# Patient Record
Sex: Male | Born: 1945
Health system: Southern US, Community
[De-identification: ages and names within clinical notes are randomized; demographics above are authoritative.]

## PROBLEM LIST (undated history)

## (undated) DIAGNOSIS — I10 Essential (primary) hypertension: Secondary | ICD-10-CM

## (undated) DIAGNOSIS — C189 Malignant neoplasm of colon, unspecified: Secondary | ICD-10-CM

## (undated) DIAGNOSIS — D126 Benign neoplasm of colon, unspecified: Secondary | ICD-10-CM

## (undated) DIAGNOSIS — I214 Non-ST elevation (NSTEMI) myocardial infarction: Secondary | ICD-10-CM

## (undated) DIAGNOSIS — E785 Hyperlipidemia, unspecified: Secondary | ICD-10-CM

## (undated) DIAGNOSIS — N189 Chronic kidney disease, unspecified: Secondary | ICD-10-CM

## (undated) DIAGNOSIS — N529 Male erectile dysfunction, unspecified: Secondary | ICD-10-CM

## (undated) DIAGNOSIS — R7303 Prediabetes: Secondary | ICD-10-CM

## (undated) DIAGNOSIS — M1990 Unspecified osteoarthritis, unspecified site: Secondary | ICD-10-CM

## (undated) DIAGNOSIS — K4090 Unilateral inguinal hernia, without obstruction or gangrene, not specified as recurrent: Secondary | ICD-10-CM

## (undated) HISTORY — DX: Benign neoplasm of colon, unspecified: D12.6

## (undated) HISTORY — PX: EYE SURGERY: SHX253

## (undated) HISTORY — PX: OTHER SURGICAL HISTORY: SHX169

## (undated) HISTORY — DX: Non-ST elevation (NSTEMI) myocardial infarction: I21.4

---

## 2005-04-19 ENCOUNTER — Ambulatory Visit: Payer: Self-pay

## 2005-04-30 HISTORY — PX: COLON SURGERY: SHX602

## 2005-05-22 ENCOUNTER — Inpatient Hospital Stay: Payer: Self-pay | Admitting: Surgery

## 2005-06-08 ENCOUNTER — Ambulatory Visit: Payer: Self-pay | Admitting: Oncology

## 2005-06-30 ENCOUNTER — Ambulatory Visit: Payer: Self-pay | Admitting: Oncology

## 2006-01-07 ENCOUNTER — Ambulatory Visit: Payer: Self-pay | Admitting: Oncology

## 2006-07-05 ENCOUNTER — Ambulatory Visit: Payer: Self-pay | Admitting: Oncology

## 2007-01-10 ENCOUNTER — Ambulatory Visit: Payer: Self-pay | Admitting: Oncology

## 2007-07-01 ENCOUNTER — Ambulatory Visit: Payer: Self-pay | Admitting: Oncology

## 2007-07-09 ENCOUNTER — Ambulatory Visit: Payer: Self-pay | Admitting: Oncology

## 2007-08-01 ENCOUNTER — Ambulatory Visit: Payer: Self-pay | Admitting: Oncology

## 2007-08-19 ENCOUNTER — Ambulatory Visit: Payer: Self-pay | Admitting: Surgery

## 2008-01-01 ENCOUNTER — Ambulatory Visit: Payer: Self-pay | Admitting: Oncology

## 2008-01-07 ENCOUNTER — Ambulatory Visit: Payer: Self-pay | Admitting: Oncology

## 2008-02-01 ENCOUNTER — Ambulatory Visit: Payer: Self-pay | Admitting: Oncology

## 2008-05-31 ENCOUNTER — Ambulatory Visit: Payer: Self-pay | Admitting: Oncology

## 2008-06-30 ENCOUNTER — Ambulatory Visit: Payer: Self-pay | Admitting: Oncology

## 2008-07-15 ENCOUNTER — Ambulatory Visit: Payer: Self-pay | Admitting: Oncology

## 2008-07-31 ENCOUNTER — Ambulatory Visit: Payer: Self-pay | Admitting: Oncology

## 2008-12-31 ENCOUNTER — Ambulatory Visit: Payer: Self-pay | Admitting: Oncology

## 2009-01-13 ENCOUNTER — Ambulatory Visit: Payer: Self-pay | Admitting: Oncology

## 2009-01-31 ENCOUNTER — Ambulatory Visit: Payer: Self-pay | Admitting: Oncology

## 2009-06-30 ENCOUNTER — Ambulatory Visit: Payer: Self-pay | Admitting: Oncology

## 2009-07-13 ENCOUNTER — Ambulatory Visit: Payer: Self-pay | Admitting: Oncology

## 2009-07-31 ENCOUNTER — Ambulatory Visit: Payer: Self-pay | Admitting: Oncology

## 2009-12-31 ENCOUNTER — Ambulatory Visit: Payer: Self-pay | Admitting: Oncology

## 2010-01-25 ENCOUNTER — Ambulatory Visit: Payer: Self-pay | Admitting: Oncology

## 2010-01-31 ENCOUNTER — Ambulatory Visit: Payer: Self-pay | Admitting: Oncology

## 2010-07-31 ENCOUNTER — Ambulatory Visit: Payer: Self-pay | Admitting: Oncology

## 2010-08-03 ENCOUNTER — Ambulatory Visit: Payer: Self-pay | Admitting: Oncology

## 2010-08-31 ENCOUNTER — Ambulatory Visit: Payer: Self-pay | Admitting: Oncology

## 2011-09-14 ENCOUNTER — Inpatient Hospital Stay: Payer: Self-pay | Admitting: Internal Medicine

## 2011-09-14 DIAGNOSIS — Z01810 Encounter for preprocedural cardiovascular examination: Secondary | ICD-10-CM

## 2015-01-10 DIAGNOSIS — R7989 Other specified abnormal findings of blood chemistry: Secondary | ICD-10-CM | POA: Insufficient documentation

## 2016-03-12 ENCOUNTER — Encounter: Payer: Self-pay | Admitting: *Deleted

## 2016-03-13 ENCOUNTER — Encounter: Payer: Self-pay | Admitting: *Deleted

## 2016-03-13 ENCOUNTER — Encounter
Admission: RE | Disposition: A | Discharge status: Home / Self Care | Payer: Self-pay | Source: Ambulatory Visit | Attending: Gastroenterology

## 2016-03-13 ENCOUNTER — Ambulatory Visit: Payer: Medicare Other | Admitting: Anesthesiology

## 2016-03-13 ENCOUNTER — Ambulatory Visit
Admission: RE | Admit: 2016-03-13 | Discharge: 2016-03-13 | Disposition: A | Discharge status: Home / Self Care | Payer: Medicare Other | Source: Ambulatory Visit | Attending: Gastroenterology | Admitting: Gastroenterology

## 2016-03-13 DIAGNOSIS — D125 Benign neoplasm of sigmoid colon: Secondary | ICD-10-CM | POA: Diagnosis not present

## 2016-03-13 DIAGNOSIS — Z87891 Personal history of nicotine dependence: Secondary | ICD-10-CM | POA: Diagnosis not present

## 2016-03-13 DIAGNOSIS — N529 Male erectile dysfunction, unspecified: Secondary | ICD-10-CM | POA: Diagnosis not present

## 2016-03-13 DIAGNOSIS — Z85038 Personal history of other malignant neoplasm of large intestine: Secondary | ICD-10-CM | POA: Diagnosis not present

## 2016-03-13 DIAGNOSIS — Z980 Intestinal bypass and anastomosis status: Secondary | ICD-10-CM | POA: Insufficient documentation

## 2016-03-13 DIAGNOSIS — K635 Polyp of colon: Secondary | ICD-10-CM | POA: Insufficient documentation

## 2016-03-13 DIAGNOSIS — Z79899 Other long term (current) drug therapy: Secondary | ICD-10-CM | POA: Insufficient documentation

## 2016-03-13 DIAGNOSIS — Z1211 Encounter for screening for malignant neoplasm of colon: Secondary | ICD-10-CM | POA: Diagnosis not present

## 2016-03-13 DIAGNOSIS — E785 Hyperlipidemia, unspecified: Secondary | ICD-10-CM | POA: Diagnosis not present

## 2016-03-13 DIAGNOSIS — Z7982 Long term (current) use of aspirin: Secondary | ICD-10-CM | POA: Diagnosis not present

## 2016-03-13 DIAGNOSIS — K621 Rectal polyp: Secondary | ICD-10-CM | POA: Insufficient documentation

## 2016-03-13 DIAGNOSIS — K5730 Diverticulosis of large intestine without perforation or abscess without bleeding: Secondary | ICD-10-CM | POA: Insufficient documentation

## 2016-03-13 DIAGNOSIS — I10 Essential (primary) hypertension: Secondary | ICD-10-CM | POA: Diagnosis not present

## 2016-03-13 DIAGNOSIS — K640 First degree hemorrhoids: Secondary | ICD-10-CM | POA: Diagnosis not present

## 2016-03-13 HISTORY — DX: Male erectile dysfunction, unspecified: N52.9

## 2016-03-13 HISTORY — DX: Hyperlipidemia, unspecified: E78.5

## 2016-03-13 HISTORY — DX: Malignant neoplasm of colon, unspecified: C18.9

## 2016-03-13 HISTORY — DX: Essential (primary) hypertension: I10

## 2016-03-13 HISTORY — PX: COLONOSCOPY WITH PROPOFOL: SHX5780

## 2016-03-13 SURGERY — COLONOSCOPY WITH PROPOFOL
Anesthesia: General

## 2016-03-13 MED ORDER — PROPOFOL 10 MG/ML IV BOLUS
INTRAVENOUS | Status: DC | PRN
  Administered 2016-03-13: 70 mg via INTRAVENOUS
  Administered 2016-03-13: 13 mg via INTRAVENOUS

## 2016-03-13 MED ORDER — SODIUM CHLORIDE 0.9 % IV SOLN
INTRAVENOUS | Status: DC
  Administered 2016-03-13 (×2): via INTRAVENOUS

## 2016-03-13 MED ORDER — SODIUM CHLORIDE 0.9 % IV SOLN: INTRAVENOUS | Status: DC

## 2016-03-13 MED ORDER — PROPOFOL 500 MG/50ML IV EMUL
INTRAVENOUS | Status: DC | PRN
  Administered 2016-03-13: 100 ug/kg/min via INTRAVENOUS

## 2016-03-13 MED ORDER — FENTANYL CITRATE (PF) 100 MCG/2ML IJ SOLN
INTRAMUSCULAR | Status: DC | PRN
  Administered 2016-03-13: 50 ug via INTRAVENOUS

## 2016-03-13 MED ORDER — LIDOCAINE HCL (CARDIAC) 20 MG/ML IV SOLN
INTRAVENOUS | Status: DC | PRN
  Administered 2016-03-13: 100 mg via INTRAVENOUS

## 2016-03-13 MED ORDER — MIDAZOLAM HCL 5 MG/5ML IJ SOLN
INTRAMUSCULAR | Status: DC | PRN
  Administered 2016-03-13: 1 mg via INTRAVENOUS

## 2016-03-13 NOTE — Anesthesia Preprocedure Evaluation (Signed)
Anesthesia Evaluation  Patient identified by MRN, date of birth, ID band Patient awake    Reviewed: Allergy & Precautions, H&P , NPO status , Patient's Chart, lab work & pertinent test results, reviewed documented beta blocker date and time   Airway Mallampati: II   Neck ROM: full    Dental  (+) Upper Dentures, Lower Dentures   Pulmonary neg pulmonary ROS, former smoker,    Pulmonary exam normal        Cardiovascular hypertension, negative cardio ROS Normal cardiovascular exam Rate:Normal     Neuro/Psych negative neurological ROS  negative psych ROS   GI/Hepatic negative GI ROS, Neg liver ROS,   Endo/Other  negative endocrine ROS  Renal/GU negative Renal ROS  negative genitourinary   Musculoskeletal   Abdominal   Peds  Hematology negative hematology ROS (+)   Anesthesia Other Findings Past Medical History:   Colon cancer [facility])                                           ED (erectile dysfunction)                                    Hypertension                                                 Hyperlipidemia                                             Past Surgical History:   COLON SURGERY                                   Right 05/06          Comment:colectomy and sigmoid colectomy    right foot surgery                                          BMI    Body Mass Index   19.53 kg/m 2     Reproductive/Obstetrics                             Anesthesia Physical Anesthesia Plan  ASA: III  Anesthesia Plan: General   Post-op Pain Management:    Induction:   Airway Management Planned:   Additional Equipment:   Intra-op Plan:   Post-operative Plan:   Informed Consent: I have reviewed the patients History and Physical, chart, labs and discussed the procedure including the risks, benefits and alternatives for the proposed anesthesia with the patient or authorized representative who has  indicated his/her understanding and acceptance.   Dental Advisory Given  Plan Discussed with: CRNA  Anesthesia Plan Comments:         Anesthesia Quick Evaluation

## 2016-03-13 NOTE — Anesthesia Postprocedure Evaluation (Deleted)
Anesthesia Post Note  Patient: Carl Townsend  Procedure(s) Performed: Procedure(s) (LRB): COLONOSCOPY WITH PROPOFOL (N/A)  Patient location during evaluation: PACU Anesthesia Type: General Level of consciousness: awake and alert Pain management: pain level controlled Vital Signs Assessment: post-procedure vital signs reviewed and stable Respiratory status: spontaneous breathing, nonlabored ventilation, respiratory function stable and patient connected to nasal cannula oxygen Cardiovascular status: blood pressure returned to baseline and stable Postop Assessment: no signs of nausea or vomiting Anesthetic complications: no    Last Vitals:  Filed Vitals:   03/13/16 0648  BP: 169/94  Pulse: 68  Temp: 36 C  Resp: 18    Last Pain: There were no vitals filed for this visit.               [name]

## 2016-03-13 NOTE — Transfer of Care (Signed)
Immediate Anesthesia Transfer of Care Note  Patient: Carl Townsend  Procedure(s) Performed: Procedure(s): COLONOSCOPY WITH PROPOFOL (N/A)  Patient Location: PACU  Anesthesia Type:General  Level of Consciousness: awake, alert , oriented and patient cooperative  Airway & Oxygen Therapy: Patient Spontanous Breathing and Patient connected to nasal cannula oxygen  Post-op Assessment: Report given to RN and Post -op Vital signs reviewed and stable  Post vital signs: Reviewed and stable  Last Vitals:  Filed Vitals:   03/13/16 0900 03/13/16 0901  BP:  94/64  Pulse:  72  Temp: 36 C 36 C  Resp:  17    Complications: No apparent anesthesia complications

## 2016-03-13 NOTE — H&P (Signed)
Outpatient short stay form Pre-procedure 03/13/2016 8:15 AM Lollie Sails MD  Primary Physician: Dr Ginette Pitman  Reason for visit:  Colonoscopy  History of present illness:  Patient is a 70 year old male presenting today for colonoscopy. He has a personal history of colon cancer that underwent resection in 2006. His last colonoscopy was in 2012. He showed no repeat polyps at that time. He denies use of any aspirin products with the exception of 81 mg aspirin that he is held for several days. He does not use any blood thinning agents. He tolerated his prep well.    Current facility-administered medications:  .  0.9 %  sodium chloride infusion, , Intravenous, Continuous, Lollie Sails, MD, Last Rate: 20 mL/hr at 03/13/16 0706 .  0.9 %  sodium chloride infusion, , Intravenous, Continuous, Lollie Sails, MD  Prescriptions prior to admission  Medication Sig Dispense Refill Last Dose  . aspirin 81 MG tablet Take 81 mg by mouth daily.   Past Week at Unknown time  . atenolol (TENORMIN) 50 MG tablet Take 50 mg by mouth daily.     . hydrochlorothiazide (HYDRODIURIL) 25 MG tablet Take 25 mg by mouth daily.        No Known Allergies   Past Medical History  Diagnosis Date  . Colon cancer ([place])   . ED (erectile dysfunction)   . Hypertension   . Hyperlipidemia     Review of systems:      Physical Exam    Heart and lungs: Regular rate and rhythm without rub or gallop, lungs are bilaterally clear.    HEENT: Normocephalic atraumatic eyes are anicteric    Other:     Pertinant exam for procedure: Soft nontender nondistended bowel sounds positive normoactive.    Planned proceedures: Colonoscopy and indicated procedures. I have discussed the risks benefits and complications of procedures to include not limited to bleeding, infection, perforation and the risk of sedation and the patient wishes to proceed.    Lollie Sails, MD Gastroenterology 03/13/2016  8:15 AM

## 2016-03-13 NOTE — Op Note (Signed)
[facility] Gastroenterology Patient Name: Carl Townsend Procedure Date: 03/13/2016 8:13 AM MRN: [id number] Account #: [id number] Date of Birth: 1946-07-12 Admit Type: Outpatient Age: 70 Room: [facility] ENDO ROOM 3 Gender: Male Note Status: Finalized Procedure:            Colonoscopy Indications:          Family history of colonic polyps in a first-degree                        relative, Personal history of malignant neoplasm of the                        colon Providers:            Lollie Sails, MD Referring MD:         [name], MD (Referring MD) Medicines:            Monitored Anesthesia Care Complications:        No immediate complications. Procedure:            Pre-Anesthesia Assessment:                       - ASA Grade Assessment: III - A patient with severe                        systemic disease.                       After obtaining informed consent, the colonoscope was                        passed under direct vision. Throughout the procedure,                        the patient's blood pressure, pulse, and oxygen                        saturations were monitored continuously. The                        Colonoscope was introduced through the anus and                        advanced to the the cecum, identified by appendiceal                        orifice and ileocecal valve. The quality of the bowel                        preparation was good. Findings:      There was evidence of a prior end-to-end colo-colonic anastomosis at 25       cm proximal to the anus. This was patent and was characterized by       healthy appearing mucosa.      A 2 mm polyp was found in the descending colon. The polyp was sessile.       The polyp was removed with a cold biopsy forceps. Resection and       retrieval were complete.      A 3 mm polyp was found in the distal sigmoid colon. The polyp was  sessile. The polyp was removed with a cold biopsy  forceps. Resection and       retrieval were complete.      Three sessile polyps were found in the rectum. The polyps were less than       1 mm in size. These polyps were removed with a cold biopsy forceps.       Resection and retrieval were complete.      Multiple small-mouthed diverticula were found in the sigmoid colon.      There is a very sharp/angulated turn at the region of the proximal       descending.      The digital rectal exam was normal.      Non-bleeding internal hemorrhoids were found during retroflexion and       during anoscopy. The hemorrhoids were Grade I (internal hemorrhoids that       do not prolapse). Impression:           - Patent end-to-end colo-colonic anastomosis,                        characterized by healthy appearing mucosa.                       - One 2 mm polyp in the descending colon, removed with                        a cold biopsy forceps. Resected and retrieved.                       - One 3 mm polyp in the distal sigmoid colon, removed                        with a cold biopsy forceps. Resected and retrieved.                       - Three less than 1 mm polyps in the rectum, removed                        with a cold biopsy forceps. Resected and retrieved.                       - Diverticulosis in the sigmoid colon.                       - Non-bleeding internal hemorrhoids. Recommendation:       - Await pathology results.                       - Telephone GI clinic for pathology results in 1 week. Procedure Code(s):    --- Professional ---                       [phone], Colonoscopy, flexible; with biopsy, single or                        multiple Diagnosis Code(s):    --- Professional ---                       Z98.0, Intestinal bypass and anastomosis status  D12.4, Benign neoplasm of descending colon                       D12.5, Benign neoplasm of sigmoid colon                       K62.1, Rectal polyp                        K64.0, First degree hemorrhoids                       Z83.71, Family history of colonic polyps                       Z85.038, Personal history of other malignant neoplasm                        of large intestine                       K57.30, Diverticulosis of large intestine without                        perforation or abscess without bleeding CPT copyright 2016 American Medical Association. All rights reserved. The codes documented in this report are preliminary and upon coder review may  be revised to meet current compliance requirements. Lollie Sails, MD 03/13/2016 8:56:44 AM This report has been signed electronically. Number of Addenda: 0 Note Initiated On: 03/13/2016 8:13 AM Scope Withdrawal Time: 0 hours 8 minutes 12 seconds  Total Procedure Duration: 0 hours 29 minutes 19 seconds       [facility]

## 2016-03-14 LAB — SURGICAL PATHOLOGY

## 2016-03-14 NOTE — Anesthesia Postprocedure Evaluation (Signed)
Anesthesia Post Note  Patient: Carl Townsend  Procedure(s) Performed: Procedure(s) (LRB): COLONOSCOPY WITH PROPOFOL (N/A)  Patient location during evaluation: PACU Anesthesia Type: General Level of consciousness: awake and alert Pain management: pain level controlled Vital Signs Assessment: post-procedure vital signs reviewed and stable Respiratory status: spontaneous breathing, nonlabored ventilation, respiratory function stable and patient connected to nasal cannula oxygen Cardiovascular status: blood pressure returned to baseline and stable Postop Assessment: no signs of nausea or vomiting Anesthetic complications: no    Last Vitals:  Filed Vitals:   03/13/16 0900 03/13/16 0901  BP:  94/64  Pulse:  72  Temp: 36 C 36 C  Resp:  17    Last Pain: There were no vitals filed for this visit.               [name]

## 2018-11-12 DIAGNOSIS — K4090 Unilateral inguinal hernia, without obstruction or gangrene, not specified as recurrent: Secondary | ICD-10-CM | POA: Insufficient documentation

## 2019-06-04 DIAGNOSIS — N183 Chronic kidney disease, stage 3 unspecified: Secondary | ICD-10-CM | POA: Insufficient documentation

## 2020-01-20 ENCOUNTER — Other Ambulatory Visit: Payer: Self-pay | Admitting: Physician Assistant

## 2020-01-20 ENCOUNTER — Ambulatory Visit
Admission: RE | Admit: 2020-01-20 | Discharge: 2020-01-20 | Disposition: A | Discharge status: Home / Self Care | Payer: Medicare Other | Source: Ambulatory Visit | Attending: Physician Assistant | Admitting: Physician Assistant

## 2020-01-20 ENCOUNTER — Other Ambulatory Visit: Payer: Self-pay

## 2020-01-20 DIAGNOSIS — G44209 Tension-type headache, unspecified, not intractable: Secondary | ICD-10-CM | POA: Diagnosis present

## 2020-02-26 ENCOUNTER — Ambulatory Visit: Payer: Medicare Other | Attending: Internal Medicine

## 2020-02-26 DIAGNOSIS — Z23 Encounter for immunization: Secondary | ICD-10-CM

## 2020-02-26 NOTE — Progress Notes (Signed)
   Covid-19 Vaccination Clinic  Name:  Carl Townsend    MRN: [id number] DOB: 03/30/1946  02/26/2020  Carl Townsend was observed post Covid-19 immunization for 15 minutes without incidence. He was provided with Vaccine Information Sheet and instruction to access the V-Safe system.   Carl Townsend was instructed to call 911 with any severe reactions post vaccine: [name] Kitchen Difficulty breathing  . Swelling of your face and throat  . A fast heartbeat  . A bad rash all over your body  . Dizziness and weakness    Immunizations Administered    Name Date Dose VIS Date Route   Pfizer COVID-19 Vaccine 02/26/2020  9:03 AM 0.3 mL 12/11/2019 Intramuscular   Manufacturer: [place]   Lot: [id number]   [place]: [id number]

## 2020-03-07 ENCOUNTER — Other Ambulatory Visit
Admission: RE | Admit: 2020-03-07 | Discharge: 2020-03-07 | Disposition: A | Discharge status: Home / Self Care | Payer: Medicare Other | Source: Ambulatory Visit | Attending: Internal Medicine | Admitting: Internal Medicine

## 2020-03-07 DIAGNOSIS — Z01812 Encounter for preprocedural laboratory examination: Secondary | ICD-10-CM | POA: Diagnosis present

## 2020-03-07 DIAGNOSIS — Z20822 Contact with and (suspected) exposure to covid-19: Secondary | ICD-10-CM | POA: Insufficient documentation

## 2020-03-08 ENCOUNTER — Encounter: Payer: Self-pay | Admitting: Internal Medicine

## 2020-03-08 LAB — SARS CORONAVIRUS 2 (TAT 6-24 HRS): SARS Coronavirus 2: NEGATIVE

## 2020-03-09 ENCOUNTER — Encounter
Admission: RE | Disposition: A | Discharge status: Home / Self Care | Payer: Self-pay | Source: Home / Self Care | Attending: Internal Medicine

## 2020-03-09 ENCOUNTER — Other Ambulatory Visit: Payer: Self-pay

## 2020-03-09 ENCOUNTER — Ambulatory Visit: Payer: Medicare Other | Admitting: Certified Registered Nurse Anesthetist

## 2020-03-09 ENCOUNTER — Ambulatory Visit
Admission: RE | Admit: 2020-03-09 | Discharge: 2020-03-09 | Disposition: A | Discharge status: Home / Self Care | Payer: Medicare Other | Attending: Internal Medicine | Admitting: Internal Medicine

## 2020-03-09 DIAGNOSIS — K5730 Diverticulosis of large intestine without perforation or abscess without bleeding: Secondary | ICD-10-CM | POA: Diagnosis not present

## 2020-03-09 DIAGNOSIS — I129 Hypertensive chronic kidney disease with stage 1 through stage 4 chronic kidney disease, or unspecified chronic kidney disease: Secondary | ICD-10-CM | POA: Insufficient documentation

## 2020-03-09 DIAGNOSIS — N183 Chronic kidney disease, stage 3 unspecified: Secondary | ICD-10-CM | POA: Insufficient documentation

## 2020-03-09 DIAGNOSIS — Z79899 Other long term (current) drug therapy: Secondary | ICD-10-CM | POA: Insufficient documentation

## 2020-03-09 DIAGNOSIS — Z980 Intestinal bypass and anastomosis status: Secondary | ICD-10-CM | POA: Diagnosis not present

## 2020-03-09 DIAGNOSIS — N529 Male erectile dysfunction, unspecified: Secondary | ICD-10-CM | POA: Insufficient documentation

## 2020-03-09 DIAGNOSIS — Z1211 Encounter for screening for malignant neoplasm of colon: Secondary | ICD-10-CM | POA: Insufficient documentation

## 2020-03-09 DIAGNOSIS — Z85038 Personal history of other malignant neoplasm of large intestine: Secondary | ICD-10-CM | POA: Insufficient documentation

## 2020-03-09 DIAGNOSIS — Z7982 Long term (current) use of aspirin: Secondary | ICD-10-CM | POA: Diagnosis not present

## 2020-03-09 HISTORY — DX: Chronic kidney disease, unspecified: N18.9

## 2020-03-09 HISTORY — DX: Unilateral inguinal hernia, without obstruction or gangrene, not specified as recurrent: K40.90

## 2020-03-09 HISTORY — PX: COLONOSCOPY WITH PROPOFOL: SHX5780

## 2020-03-09 SURGERY — COLONOSCOPY WITH PROPOFOL
Anesthesia: General

## 2020-03-09 MED ORDER — SODIUM CHLORIDE 0.9 % IV SOLN
INTRAVENOUS | Status: DC
  Administered 2020-03-09: 1000 mL via INTRAVENOUS

## 2020-03-09 MED ORDER — LIDOCAINE HCL (CARDIAC) PF 100 MG/5ML IV SOSY
PREFILLED_SYRINGE | INTRAVENOUS | Status: DC | PRN
  Administered 2020-03-09: 60 mg via INTRAVENOUS

## 2020-03-09 MED ORDER — PROPOFOL 10 MG/ML IV BOLUS
INTRAVENOUS | Status: AC
  Filled 2020-03-09: qty 20

## 2020-03-09 MED ORDER — PROPOFOL 500 MG/50ML IV EMUL
INTRAVENOUS | Status: DC | PRN
  Administered 2020-03-09: 75 ug/kg/min via INTRAVENOUS
  Administered 2020-03-09: 40 mg via INTRAVENOUS
  Administered 2020-03-09: 20 mg via INTRAVENOUS

## 2020-03-09 NOTE — Transfer of Care (Signed)
Immediate Anesthesia Transfer of Care Note  Patient: Carl Townsend  Procedure(s) Performed: COLONOSCOPY WITH PROPOFOL (N/A )  Patient Location: PACU  Anesthesia Type:General  Level of Consciousness: awake, alert  and oriented  Airway & Oxygen Therapy: Patient Spontanous Breathing  Post-op Assessment: Report given to RN  Post vital signs: Reviewed  Last Vitals:  Vitals Value Taken Time  BP 120/68 03/09/20 1321  Temp 36.7 C 03/09/20 1320  Pulse 76 03/09/20 1321  Resp 20 03/09/20 1321  SpO2 97 % 03/09/20 1321  Vitals shown include unvalidated device data.  Last Pain:  Vitals:   03/09/20 1320  TempSrc: Temporal  PainSc: Asleep         Complications: No apparent anesthesia complications

## 2020-03-09 NOTE — Op Note (Signed)
[facility] Gastroenterology Patient Name: Carl Townsend Procedure Date: 03/09/2020 12:54 PM MRN: [id number] Account #: [id number] Date of Birth: 10-05-1946 Admit Type: Outpatient Age: 74 Room: [facility] ENDO ROOM 3 Gender: Male Note Status: Finalized Procedure:             Colonoscopy Indications:           High risk colon cancer surveillance: Personal history                         of colon cancer Providers:             Benay Pike. Azalea Cedar MD, MD Medicines:             Propofol per Anesthesia Complications:         No immediate complications. Procedure:             Pre-Anesthesia Assessment:                        - The risks and benefits of the procedure and the                         sedation options and risks were discussed with the                         patient. All questions were answered and informed                         consent was obtained.                        - Patient identification and proposed procedure were                         verified prior to the procedure by the nurse. The                         procedure was verified in the procedure room.                        - ASA Grade Assessment: III - A patient with severe                         systemic disease.                        - After reviewing the risks and benefits, the patient                         was deemed in satisfactory condition to undergo the                         procedure.                        After obtaining informed consent, the colonoscope was                         passed under direct vision. Throughout the procedure,  the patient's blood pressure, pulse, and oxygen                         saturations were monitored continuously. The                         Colonoscope was introduced through the anus and                         advanced to the the cecum, identified by appendiceal                         orifice and ileocecal valve. The  colonoscopy was                         somewhat difficult due to restricted mobility of the                         colon. Successful completion of the procedure was                         aided by applying abdominal pressure. The patient                         tolerated the procedure well. The quality of the bowel                         preparation was excellent. The ileocecal valve,                         appendiceal orifice, and rectum were photographed. Findings:      The perianal and digital rectal examinations were normal. Pertinent       negatives include normal sphincter tone and no palpable rectal lesions.      A few medium-mouthed diverticula were found in the left colon.      There was evidence of a prior end-to-end colo-colonic anastomosis in the       sigmoid colon. This was patent and was characterized by healthy       appearing mucosa. The anastomosis was traversed. Estimated blood loss:       none.      The exam was otherwise without abnormality on direct and retroflexion       views. Impression:            - Diverticulosis in the left colon.                        - Patent end-to-end colo-colonic anastomosis,                         characterized by healthy appearing mucosa.                        - The examination was otherwise normal on direct and                         retroflexion views.                        - No specimens collected.  Recommendation:        - Patient has a contact number available for                         emergencies. The signs and symptoms of potential                         delayed complications were discussed with the patient.                         Return to normal activities tomorrow. Written                         discharge instructions were provided to the patient.                        - Resume previous diet.                        - Continue present medications.                        - Repeat colonoscopy in 5 years for  screening purposes.                        - Return to GI office PRN.                        - The findings and recommendations were discussed with                         the patient. Procedure Code(s):     --- Professional ---                        [id number], Colorectal cancer screening; colonoscopy on                         individual at high risk Diagnosis Code(s):     --- Professional ---                        K57.30, Diverticulosis of large intestine without                         perforation or abscess without bleeding                        Z98.0, Intestinal bypass and anastomosis status                        Z85.038, Personal history of other malignant neoplasm                         of large intestine CPT copyright 2019 American Medical Association. All rights reserved. The codes documented in this report are preliminary and upon coder review may  be revised to meet current compliance requirements. Efrain Sella MD, MD 03/09/2020 1:21:06 PM This report has been signed electronically. Number of Addenda: 0 Note Initiated On: 03/09/2020 12:54 PM Scope Withdrawal Time: 0 hours 3 minutes 34 seconds  Total Procedure Duration: 0 hours  12 minutes 48 seconds  Estimated Blood Loss:  Estimated blood loss: none.      [facility]

## 2020-03-09 NOTE — H&P (Signed)
Outpatient short stay form Pre-procedure 03/09/2020 12:55 PM Carl Townsend K. Alice Reichert, M.D.  Primary Physician: [name], M.D.  Reason for visit:  Personal history of colon cancer - 2005.  History of present illness:  Pleasant 74 y/o male presents for colon cancer surveillance for personal hx of colon cancer in 2005 s/p partial colectomy by Dr. Rochel Brome. Patient denies change in bowel habits, rectal bleeding, weight loss or abdominal pain.      Current Facility-Administered Medications:  .  0.9 %  sodium chloride infusion, , Intravenous, Continuous, [place], Benay Pike, MD, Last Rate: 20 mL/hr at 03/09/20 1236, 1,000 mL at 03/09/20 1236  Medications Prior to Admission  Medication Sig Dispense Refill Last Dose  . amLODipine (NORVASC) 2.5 MG tablet Take 2.5 mg by mouth daily.   03/09/2020 at 1000  . aspirin 81 MG tablet Take 81 mg by mouth daily.   Past Week at Unknown time  . atenolol (TENORMIN) 50 MG tablet Take 50 mg by mouth daily.   03/08/2020 at Unknown time  . candesartan (ATACAND) 16 MG tablet Take 16 mg by mouth daily.   Past Week at Unknown time  . cyanocobalamin 1000 MCG tablet Take 1,000 mcg by mouth daily.   Past Week at Unknown time  . ergocalciferol (VITAMIN D2) 1.25 MG (50000 UT) capsule Take 50,000 Units by mouth once a week.   Past Week at Unknown time  . Multiple Vitamin (MULTIVITAMIN) tablet Take 1 tablet by mouth daily.   Past Week at Unknown time  . sildenafil (VIAGRA) 100 MG tablet Take 100 mg by mouth daily as needed for erectile dysfunction.   Past Month at Unknown time     No Known Allergies   Past Medical History:  Diagnosis Date  . Chronic kidney disease    chronic kidney disease - stage 3, GFR 30-59 ml/min  . Colon cancer ([name])   . ED (erectile dysfunction)   . Hyperlipidemia   . Hypertension   . Inguinal hernia     Review of systems:  Otherwise negative.    Physical Exam  Gen: Alert, oriented. Appears stated age.  HEENT: [place]/AT.  PERRLA. Lungs: CTA, no wheezes. CV: RR nl S1, S2. Abd: soft, benign, no masses. BS+ Ext: No edema. Pulses 2+    Planned procedures: Proceed with colonoscopy. The patient understands the nature of the planned procedure, indications, risks, alternatives and potential complications including but not limited to bleeding, infection, perforation, damage to internal organs and possible oversedation/side effects from anesthesia. The patient agrees and gives consent to proceed.  Please refer to procedure notes for findings, recommendations and patient disposition/instructions.     Devrin Monforte K. Alice Reichert, M.D. Gastroenterology 03/09/2020  12:55 PM

## 2020-03-09 NOTE — Interval H&P Note (Signed)
History and Physical Interval Note:  03/09/2020 12:57 PM  Carl Townsend  has presented today for surgery, with the diagnosis of PERSONAL HX.OF COLON CANCER.  The various methods of treatment have been discussed with the patient and family. After consideration of risks, benefits and other options for treatment, the patient has consented to  Procedure(s): COLONOSCOPY WITH PROPOFOL (N/A) as a surgical intervention.  The patient's history has been reviewed, patient examined, no change in status, stable for surgery.  I have reviewed the patient's chart and labs.  Questions were answered to the patient's satisfaction.     [place], [place]

## 2020-03-09 NOTE — Anesthesia Postprocedure Evaluation (Signed)
Anesthesia Post Note  Patient: Carl Townsend  Procedure(s) Performed: COLONOSCOPY WITH PROPOFOL (N/A )  Patient location during evaluation: Endoscopy Anesthesia Type: General Level of consciousness: awake and alert Pain management: pain level controlled Vital Signs Assessment: post-procedure vital signs reviewed and stable Respiratory status: spontaneous breathing, nonlabored ventilation, respiratory function stable and patient connected to nasal cannula oxygen Cardiovascular status: blood pressure returned to baseline and stable Postop Assessment: no apparent nausea or vomiting Anesthetic complications: no     Last Vitals:  Vitals:   03/09/20 1330 03/09/20 1340  BP: 104/71 128/89  Pulse: 69 74  Resp: 17 16  Temp:    SpO2: 96% 97%    Last Pain:  Vitals:   03/09/20 1340  TempSrc:   PainSc: 0-No pain                 Arita Miss

## 2020-03-09 NOTE — Anesthesia Preprocedure Evaluation (Signed)
Anesthesia Evaluation  Patient identified by MRN, date of birth, ID band Patient awake    Reviewed: Allergy & Precautions, NPO status , Patient's Chart, lab work & pertinent test results  History of Anesthesia Complications Negative for: history of anesthetic complications  Airway Mallampati: II  TM Distance: >3 FB Neck ROM: Full    Dental  (+) Upper Dentures, Lower Dentures   Pulmonary neg pulmonary ROS, neg sleep apnea, neg COPD, Patient abstained from smoking.Not current smoker, former smoker,    Pulmonary exam normal breath sounds clear to auscultation       Cardiovascular Exercise Tolerance: Good METShypertension, Pt. on medications (-) CAD and (-) Past MI (-) dysrhythmias  Rhythm:Regular Rate:Normal - Systolic murmurs    Neuro/Psych negative neurological ROS  negative psych ROS   GI/Hepatic neg GERD  ,(+)     (-) substance abuse  ,   Endo/Other  neg diabetes  Renal/GU CRFRenal diseasenegative Renal ROS     Musculoskeletal   Abdominal   Peds  Hematology   Anesthesia Other Findings Past Medical History: No date: Chronic kidney disease     Comment:  chronic kidney disease - stage 3, GFR 30-59 ml/min No date: Colon cancer (HCC) No date: ED (erectile dysfunction) No date: Hyperlipidemia No date: Hypertension No date: Inguinal hernia  Reproductive/Obstetrics                             Anesthesia Physical Anesthesia Plan  ASA: III  Anesthesia Plan: General   Post-op Pain Management:    Induction: Intravenous  PONV Risk Score and Plan: 2 and Ondansetron, Propofol infusion and TIVA  Airway Management Planned: Nasal Cannula  Additional Equipment: None  Intra-op Plan:   Post-operative Plan:   Informed Consent: I have reviewed the patients History and Physical, chart, labs and discussed the procedure including the risks, benefits and alternatives for the proposed  anesthesia with the patient or authorized representative who has indicated his/her understanding and acceptance.     Dental advisory given  Plan Discussed with: CRNA and Surgeon  Anesthesia Plan Comments: (Discussed risks of anesthesia with patient, including possibility of difficulty with spontaneous ventilation under anesthesia necessitating airway intervention, PONV, and rare risks such as cardiac or respiratory or neurological events. Patient understands.)        Anesthesia Quick Evaluation

## 2020-03-10 ENCOUNTER — Encounter: Payer: Self-pay | Admitting: *Deleted

## 2020-03-22 ENCOUNTER — Ambulatory Visit: Payer: Medicare Other | Attending: Internal Medicine

## 2020-03-22 DIAGNOSIS — Z23 Encounter for immunization: Secondary | ICD-10-CM

## 2020-03-22 NOTE — Progress Notes (Signed)
   Covid-19 Vaccination Clinic  Name:  Carl Townsend    MRN: [id number] DOB: Oct 09, 1946  03/22/2020  [name] was observed post Covid-19 immunization for 15 minutes without incident. He was provided with Vaccine Information Sheet and instruction to access the V-Safe system.   [name] was instructed to call 911 with any severe reactions post vaccine: [name] Kitchen Difficulty breathing  . Swelling of face and throat  . A fast heartbeat  . A bad rash all over body  . Dizziness and weakness   Immunizations Administered    Name Date Dose VIS Date Route   Pfizer COVID-19 Vaccine 03/22/2020  1:23 PM 0.3 mL 12/11/2019 Intramuscular   Manufacturer: [employer]   Lot: [id number]   Horace: [id number]

## 2020-08-15 ENCOUNTER — Ambulatory Visit: Payer: Self-pay | Admitting: Surgery

## 2020-08-15 NOTE — H&P (Signed)
Subjective:   CC: Non-recurrent bilateral inguinal hernia without obstruction or gangrene [K40.20]  HPI: Carl Townsend is a 74 y.o. male who was referred by Vishwanath Handattur Ha* for evaluation of above. Symptoms were first noted a few years ago. Pain is dull and slightly worsening lately, confined to the left groin, without radiation. Associated with nothing specific, exacerbated by nothing specific Lump is reducible.   Past Medical History: has a past medical history of Colon cancer (CMS-HCC), ED (erectile dysfunction), Hyperlipidemia, Hyperplastic colon polyp (03/13/2016), Hypertension, and Tubular adenoma of colon, unspecified (03/13/2016).  Past Surgical History:  Past Surgical History:  Procedure Laterality Date  . COLON SURGERY  Rt Colectomy and Sigmoid Colectomy 04/2005 for Colon Ca  . COLONOSCOPY 09/17/11  Multiple Divertcs, End to end anastamosis proximal Transverse Colon,and stricture Proximal Sigmoid  . COLONOSCOPY 03/13/2016  Tubular adenoma/Hyperplastic colon polyp/Repeat 2yrs/MUS  . COLONOSCOPY 03/09/2020  [place] Adenomatous Polyp; Diverticulosis: CBF 02/2025  . Rt foot surgery  . VITREOUS RETINAL SURGERY 2018   Family History: family history includes Colon polyps in his mother; Heart failure in his father and mother; High blood pressure (Hypertension) in an other family member; Myocardial Infarction (Heart attack) in his brother; Stroke in his brother.  Social History: reports that he quit smoking about 21 years ago. He has never used smokeless tobacco. He reports current alcohol use. No history on file for drug use.  Current Medications: has a current medication list which includes the following prescription(s): amlodipine, aspirin, atenolol, cyanocobalamin, ergocalciferol (vitamin d2), multivitamin, sildenafil, and candesartan.  Allergies:  Allergies as of 06/22/2020  . (No Known Allergies)   ROS:  A 15 point review of systems was performed and pertinent positives  and negatives noted in HPI  Objective:    BP (!) 183/96  Pulse 83  Temp 36.6 C (97.8 F) (Oral)  Ht 180.3 cm (5' 11")  Wt 66.6 kg (146 lb 12.8 oz)  BMI 20.47 kg/m   Constitutional : alert, appears stated age, cooperative and no distress  Lymphatics/Throat: no asymmetry, masses, or scars  Respiratory: clear to auscultation bilaterally  Cardiovascular: regular rate and rhythm  Gastrointestinal: soft, non-tender; bowel sounds normal; no masses, no organomegaly. inguinal hernia noted. moderate, reducible, no overlying skin changes and LEFT, POSSIBLE SMALL ONE ON RIGHT AS WELL  Musculoskeletal: Steady gait and movement  Skin: Cool and moist,  Psychiatric: Normal affect, non-agitated, not confused    LABS:  N/A   RADS: N/A Assessment:    Non-recurrent bilateral inguinal hernia without obstruction or gangrene [K40.20]  Plan:    1. Non-recurrent bilateral inguinal hernia without obstruction or gangrene [K40.20]  Discussed the risk of surgery including recurrence, which can be up to 50% in the case of incisional or complex hernias, possible use of prosthetic materials (mesh) and the increased risk of mesh infxn if used, bleeding, chronic pain, post-op infxn, post-op SBO or ileus, and possible re-operation to address said risks. The risks of general anesthetic, if used, includes MI, CVA, sudden death or even reaction to anesthetic medications also discussed. Alternatives include continued observation. Benefits include possible symptom relief, prevention of incarceration, strangulation, enlargement in size over time, and the risk of emergency surgery in the face of strangulation.   Typical post-op recovery time of 3-5 days with 2 weeks of activity restrictions were also discussed.  ED return precautions given for sudden increase in pain, size of hernia with accompanying fever, nausea, and/or vomiting.  The patient verbalized understanding and all questions were answered to the    patient's satisfaction.  2. Patient has elected to proceed with surgical treatment. Procedure will be scheduled. Written consent was obtained.. LEFT, POSSIBLE BILATERAL, robotic assisted laparoscopic. Hx of colon resection x2, so chance we may need to convert to open discussed as well. Will only fix left side if we convert to open. Wife and pt verbalized understanding. They also requested cardiology clearence as well, which we can obtain. Supposed to see [facility] for routine exam.  

## 2020-08-17 ENCOUNTER — Encounter
Admission: RE | Admit: 2020-08-17 | Discharge: 2020-08-17 | Disposition: A | Discharge status: Home / Self Care | Payer: Medicare Other | Source: Ambulatory Visit | Attending: Surgery | Admitting: Surgery

## 2020-08-17 ENCOUNTER — Other Ambulatory Visit: Payer: Self-pay

## 2020-08-17 HISTORY — DX: Unspecified osteoarthritis, unspecified site: M19.90

## 2020-08-17 NOTE — Patient Instructions (Signed)
COVID TESTING Date: August 23, 2020 TUESDAY Testing site:  [facility] Thru Hours:  4:40 am - 1:00 pm Once you are tested, you are asked to stay quarantined (avoiding public places) until after your surgery.   Your procedure is scheduled on: August 25, 2020 Thursday  Report to Day Surgery on the 2nd floor of the [facility]. To find out your arrival time, please call [phone] between 1PM - 3PM on: Wednesday  August 24, 2020  REMEMBER: Instructions that are not followed completely may result in serious medical risk, up to and including death; or upon the discretion of your surgeon and anesthesiologist your surgery may need to be rescheduled.  Do not eat food after midnight the night before surgery.  No gum chewing, lozengers or hard candies.  You may however, drink CLEAR liquids up to 2 hours before you are scheduled to arrive for your surgery. Do not drink anything within 2 hours of your scheduled arrival time.  Clear liquids include: - water  - apple juice without pulp - gatorade (not RED) - black coffee or tea (Do NOT add milk or creamers to the coffee or tea) Do NOT drink anything that is not on this list.  Type 1 and Type 2 diabetics should only drink water.    TAKE THESE MEDICATIONS THE MORNING OF SURGERY WITH A SIP OF WATER: AMLODIPINE  Stop Anti-inflammatories (NSAIDS) such as Advil, Aleve, Ibuprofen, Motrin, Naproxen, Naprosyn and ASPIRIN OR Aspirin based products such as Excedrin, Goodys Powder, BC Powder. (May take Tylenol or Acetaminophen if needed.)  Stop ANY OVER THE COUNTER supplements until after surgery. (May continue Vitamin D, Vitamin B, and multivitamin.)  No Alcohol for 24 hours before or after surgery.  No Smoking including e-cigarettes for 24 hours prior to surgery.  No chewable tobacco products for at least 6 hours prior to surgery.  No nicotine patches on the day of surgery.  Do not use any  "recreational" drugs for at least a week prior to your surgery.  Please be advised that the combination of cocaine and anesthesia may have negative outcomes, up to and including death. If you test positive for cocaine, your surgery will be cancelled.  On the morning of surgery brush your teeth with toothpaste and water, you may rinse your mouth with mouthwash if you wish. Do not swallow any toothpaste or mouthwash.  Do not wear jewelry, make-up, hairpins, clips or nail polish.  Do not wear lotions, powders, or perfumes OR DEODORANT  Do not shave 48 hours prior to surgery.   Contact lenses, hearing aids and dentures may not be worn into surgery.  Do not bring valuables to the hospital. [facility] is not responsible for any missing/lost belongings or valuables.   Use CHG Soap as directed on instruction sheet.  Notify your doctor if there is any change in your medical condition (cold, fever, infection).  Wear comfortable clothing (specific to your surgery type) to the hospital.  Plan for stool softeners for home use; pain medications have a tendency to cause constipation. You can also help prevent constipation by eating foods high in fiber such as fruits and vegetables and drinking plenty of fluids as your diet allows.  After surgery, you can help prevent lung complications by doing breathing exercises.  Take deep breaths and cough every 1-2 hours. Your doctor may order a device called an Incentive Spirometer to help you take deep breaths. When coughing or sneezing, hold a  pillow firmly against your incision with both hands. This is called "splinting." Doing this helps protect your incision. It also decreases belly discomfort.  If you are being discharged the day of surgery, you will not be allowed to drive home. You will need a responsible adult (18 years or older) to drive you home and stay with you that night.   Please call the [place] Dept. at [phone] if you  have any questions about these instructions.  Visitation Policy:  Patients undergoing a surgery or procedure may have one family member or support person with them as long as that person is not COVID-19 positive or experiencing its symptoms.  That person may remain in the waiting area during the procedure.  Children under 41 years of age may have both parents or legal guardians with them during their procedure.  Inpatient Visitation Update:   In an effort to ensure the safety of our team members and our patients, we are implementing a change to our visitation policy:  Effective Monday, Aug. 9, at 7 a.m., inpatients will be allowed one support person.  o The support person may change daily.  o The support person must pass our screening, gel in and out, and wear a mask at all times, including in the patient's room.  o Patients must also wear a mask when staff or their support person are in the room.  o Masking is required regardless of vaccination status.  Systemwide, no visitors 17 or younger.

## 2020-08-18 ENCOUNTER — Other Ambulatory Visit
Admission: RE | Admit: 2020-08-18 | Discharge: 2020-08-18 | Disposition: A | Discharge status: Home / Self Care | Payer: Medicare Other | Source: Ambulatory Visit | Attending: Surgery | Admitting: Surgery

## 2020-08-18 DIAGNOSIS — I517 Cardiomegaly: Secondary | ICD-10-CM | POA: Diagnosis not present

## 2020-08-18 DIAGNOSIS — Z01810 Encounter for preprocedural cardiovascular examination: Secondary | ICD-10-CM | POA: Diagnosis present

## 2020-08-18 DIAGNOSIS — I10 Essential (primary) hypertension: Secondary | ICD-10-CM | POA: Diagnosis not present

## 2020-08-23 ENCOUNTER — Other Ambulatory Visit
Admission: RE | Admit: 2020-08-23 | Discharge: 2020-08-23 | Disposition: A | Discharge status: Home / Self Care | Payer: Medicare Other | Source: Ambulatory Visit | Attending: Surgery | Admitting: Surgery

## 2020-08-23 ENCOUNTER — Other Ambulatory Visit: Payer: Self-pay

## 2020-08-23 DIAGNOSIS — U071 COVID-19: Secondary | ICD-10-CM | POA: Insufficient documentation

## 2020-08-23 DIAGNOSIS — Z8616 Personal history of COVID-19: Secondary | ICD-10-CM

## 2020-08-23 DIAGNOSIS — Z01812 Encounter for preprocedural laboratory examination: Secondary | ICD-10-CM | POA: Diagnosis present

## 2020-08-23 HISTORY — DX: Personal history of COVID-19: Z86.16

## 2020-08-23 LAB — SARS CORONAVIRUS 2 (TAT 6-24 HRS): SARS Coronavirus 2: POSITIVE — AB

## 2020-08-24 ENCOUNTER — Encounter: Payer: Self-pay | Admitting: Urgent Care

## 2020-08-24 ENCOUNTER — Telehealth: Payer: Self-pay | Admitting: Nurse Practitioner

## 2020-08-24 ENCOUNTER — Encounter: Payer: Self-pay | Admitting: Nurse Practitioner

## 2020-08-24 ENCOUNTER — Encounter: Payer: Self-pay | Admitting: Surgery

## 2020-08-24 DIAGNOSIS — U071 COVID-19: Secondary | ICD-10-CM | POA: Insufficient documentation

## 2020-08-24 NOTE — Progress Notes (Addendum)
  [facility] Perioperative Services: Pre-Admission/Anesthesia Testing     Date: 08/24/20  Name: Carl Townsend MRN:   [id number]  Re:  Postponing surgery  Patient scheduled for robotic assisted inguinal hernia repair on 08/25/2020 with Dr. Lysle Pearl. Patient completed PAT testing yesterday (08/23/2020) at which time he was found to be (+) for SARS-CoV-2 (novel coronavirus). PMH list updated to reflect new component. Surgeon's office and the patient are aware that surgery has been postponed at this point. New surgery date is 09/08/2020. Will ensure that SDS charge nurse is also aware in order to ensure that case is removed from OR schedule for tomorrow.   Honor Loh, MSN, APRN, FNP-C, CEN [facility]  Peri-operative Services Nurse Practitioner Phone: [phone] 08/24/20 1:26 PM

## 2020-08-24 NOTE — Telephone Encounter (Signed)
Called to Discuss with patient about Covid symptoms and the use of regeneron, a monoclonal antibody infusion for those with mild to moderate Covid symptoms and at a high risk of hospitalization.     Pt is qualified for this infusion at the [facility] infusion center due to co-morbid conditions and/or a member of an at-risk group.     He lives in [place] and would like to be transfused there. I provided contact info for infusion clinic to get scheduled. Also assisted with mychart activation as he has been locked out. His wife is also symptomatic. Discussed options for being tested. If she is positive, could also consider mab. His PCP has prescribed azithromycin and prednisone.   Beckey Rutter, [place], AGNP-C [phone] ([place])

## 2020-08-26 NOTE — Progress Notes (Signed)
  [facility] Perioperative Services: Pre-Admission/Anesthesia Testing     Date: 08/26/20  Name: Carl Townsend MRN:   [id number]  Re: Cardiac clearance for surgery  Patient scheduled for a robot assisted inguinal hernia repair on 09/08/2020 with Dr. Lysle Pearl. Primary attending surgeon requested cardiac clearance prior to the patient undergoing the procedure. Presurgical clearance to proceed with bronchoscopy was issued on 08/26/2020 with a MODERATE risk stratification. Patient to hold ASA 5 das prior to procedure and resume 1 day after. Copy of clearance forwarded to medical records for inclusion in permanent EMR.   Honor Loh, MSN, APRN, FNP-C, CEN [facility]  Peri-operative Services Nurse Practitioner Phone: [phone] 08/26/20 2:07 PM

## 2020-09-08 ENCOUNTER — Encounter: Admission: RE | Payer: Self-pay | Source: Home / Self Care

## 2020-09-08 SURGERY — REPAIR, HERNIA, INGUINAL, ROBOT-ASSISTED, LAPAROSCOPIC, USING MESH
Anesthesia: General | Site: Abdomen | Laterality: Bilateral

## 2020-09-13 ENCOUNTER — Ambulatory Visit: Admission: RE | Admit: 2020-09-13 | Payer: Medicare Other | Source: Home / Self Care | Admitting: Surgery

## 2020-09-15 ENCOUNTER — Ambulatory Visit
Admission: RE | Admit: 2020-09-15 | Discharge: 2020-09-15 | Disposition: A | Discharge status: Home / Self Care | Payer: Medicare Other | Source: Ambulatory Visit | Attending: Physician Assistant | Admitting: Physician Assistant

## 2020-09-15 ENCOUNTER — Other Ambulatory Visit: Payer: Self-pay

## 2020-09-15 ENCOUNTER — Other Ambulatory Visit: Payer: Self-pay | Admitting: Physician Assistant

## 2020-09-15 DIAGNOSIS — R600 Localized edema: Secondary | ICD-10-CM | POA: Insufficient documentation

## 2020-11-03 ENCOUNTER — Encounter
Admission: RE | Admit: 2020-11-03 | Discharge: 2020-11-03 | Disposition: A | Discharge status: Home / Self Care | Payer: Medicare Other | Source: Ambulatory Visit | Attending: Surgery | Admitting: Surgery

## 2020-11-03 ENCOUNTER — Other Ambulatory Visit: Payer: Self-pay

## 2020-11-03 ENCOUNTER — Ambulatory Visit: Payer: Self-pay | Admitting: Surgery

## 2020-11-03 HISTORY — DX: Prediabetes: R73.03

## 2020-11-03 NOTE — Patient Instructions (Signed)
Your procedure is scheduled on: 11/10/20 Report to [facility] . To find out your arrival time please call [phone] between 1PM - 3PM on 11/09/20.  Remember: Instructions that are not followed completely may result in serious medical risk, up to and including death, or upon the discretion of your surgeon and anesthesiologist your surgery may need to be rescheduled.     _X__ 1. Do not eat food after midnight the night before your procedure.                 No gum chewing or hard candies. You may drink clear liquids up to 2 hours                 before you are scheduled to arrive for your surgery- DO not drink clear                 liquids within 2 hours of the start of your surgery.                 Clear Liquids include:  water, apple juice without pulp, clear carbohydrate                 drink such as Clearfast or Gatorade, Black Coffee or Tea (Do not add                 anything to coffee or tea). Diabetics water only  __X__2.  On the morning of surgery brush your teeth with toothpaste and water, you                 may rinse your mouth with mouthwash if you wish.  Do not swallow any              toothpaste of mouthwash.     _X__ 3.  No Alcohol for 24 hours before or after surgery.   _X__ 4.  Do Not Smoke or use e-cigarettes For 24 Hours Prior to Your Surgery.                 Do not use any chewable tobacco products for at least 6 hours prior to                 surgery.  ____  5.  Bring all medications with you on the day of surgery if instructed.   __X__  6.  Notify your doctor if there is any change in your medical condition      (cold, fever, infections).     Do not wear jewelry, make-up, hairpins, clips or nail polish. Do not wear lotions, powders, or perfumes.  Do not shave 48 hours prior to surgery. Men may shave face and neck. Do not bring valuables to the hospital.    [facility] is not responsible for any belongings or valuables.  Contacts,  dentures/partials or body piercings may not be worn into surgery. Bring a case for your contacts, glasses or hearing aids, a denture cup will be supplied. Leave your suitcase in the car. After surgery it may be brought to your room. For patients admitted to the hospital, discharge time is determined by your treatment team.   Patients discharged the day of surgery will not be allowed to drive home.   Please read over the following fact sheets that you were given:   MRSA Information  __X__ Take these medicines the morning of surgery with A SIP OF WATER:    1.  NONE  2.   3.   4.  5.  6.  ____ Fleet Enema (as directed)   __X__ Use CHG Soap/SAGE wipes as directed  ____ Use inhalers on the day of surgery  ____ Stop metformin/Janumet/Farxiga 2 days prior to surgery    ____ Take 1/2 of usual insulin dose the night before surgery. No insulin the morning          of surgery.   ____ Stop Blood Thinners Coumadin/Plavix/Xarelto/Pleta/Pradaxa/Eliquis/Effient/Aspirin  on   Or contact your Surgeon, Cardiologist or Medical Doctor regarding  ability to stop your blood thinners  __X__ Stop Anti-inflammatories 7 days before surgery such as Advil, Ibuprofen, Motrin,  BC or Goodies Powder, Naprosyn, Naproxen, Aleve, Aspirin    __X__ Stop all herbal supplements, fish oil or vitamin E until after surgery.    ____ Bring C-Pap to the hospital.

## 2020-11-09 ENCOUNTER — Encounter: Payer: Self-pay | Admitting: Surgery

## 2020-11-10 ENCOUNTER — Other Ambulatory Visit: Payer: Self-pay

## 2020-11-10 ENCOUNTER — Encounter: Payer: Self-pay | Admitting: Surgery

## 2020-11-10 ENCOUNTER — Ambulatory Visit
Admission: RE | Admit: 2020-11-10 | Discharge: 2020-11-10 | Disposition: A | Discharge status: Home / Self Care | Payer: Medicare Other | Attending: Surgery | Admitting: Surgery

## 2020-11-10 ENCOUNTER — Ambulatory Visit: Payer: Medicare Other | Admitting: Certified Registered Nurse Anesthetist

## 2020-11-10 ENCOUNTER — Encounter
Admission: RE | Disposition: A | Discharge status: Home / Self Care | Payer: Self-pay | Source: Home / Self Care | Attending: Surgery

## 2020-11-10 DIAGNOSIS — K4000 Bilateral inguinal hernia, with obstruction, without gangrene, not specified as recurrent: Secondary | ICD-10-CM

## 2020-11-10 DIAGNOSIS — Z87891 Personal history of nicotine dependence: Secondary | ICD-10-CM | POA: Insufficient documentation

## 2020-11-10 DIAGNOSIS — K4020 Bilateral inguinal hernia, without obstruction or gangrene, not specified as recurrent: Secondary | ICD-10-CM | POA: Insufficient documentation

## 2020-11-10 HISTORY — PX: XI ROBOTIC ASSISTED INGUINAL HERNIA REPAIR WITH MESH: SHX6706

## 2020-11-10 SURGERY — REPAIR, HERNIA, INGUINAL, ROBOT-ASSISTED, LAPAROSCOPIC, USING MESH
Anesthesia: General | Site: Abdomen | Laterality: Bilateral

## 2020-11-10 MED ORDER — SUGAMMADEX SODIUM 200 MG/2ML IV SOLN
INTRAVENOUS | Status: DC | PRN
  Administered 2020-11-10: 200 mg via INTRAVENOUS

## 2020-11-10 MED ORDER — ORAL CARE MOUTH RINSE: 15.0000 mL | Freq: Once | OROMUCOSAL | Status: AC

## 2020-11-10 MED ORDER — CHLORHEXIDINE GLUCONATE 0.12 % MT SOLN
OROMUCOSAL | Status: AC
  Administered 2020-11-10: 15 mL via OROMUCOSAL
  Filled 2020-11-10: qty 15

## 2020-11-10 MED ORDER — LIDOCAINE HCL (CARDIAC) PF 100 MG/5ML IV SOSY
PREFILLED_SYRINGE | INTRAVENOUS | Status: DC | PRN
  Administered 2020-11-10: 60 mg via INTRAVENOUS

## 2020-11-10 MED ORDER — BUPIVACAINE-EPINEPHRINE (PF) 0.5% -1:200000 IJ SOLN
INTRAMUSCULAR | Status: AC
  Filled 2020-11-10: qty 30

## 2020-11-10 MED ORDER — CELECOXIB 200 MG PO CAPS
ORAL_CAPSULE | ORAL | Status: AC
  Administered 2020-11-10: 200 mg via ORAL
  Filled 2020-11-10: qty 1

## 2020-11-10 MED ORDER — PROPOFOL 500 MG/50ML IV EMUL
INTRAVENOUS | Status: AC
  Filled 2020-11-10: qty 50

## 2020-11-10 MED ORDER — DEXMEDETOMIDINE (PRECEDEX) IN NS 20 MCG/5ML (4 MCG/ML) IV SYRINGE
PREFILLED_SYRINGE | INTRAVENOUS | Status: AC
  Filled 2020-11-10: qty 5

## 2020-11-10 MED ORDER — PROPOFOL 10 MG/ML IV BOLUS
INTRAVENOUS | Status: AC
  Filled 2020-11-10: qty 20

## 2020-11-10 MED ORDER — DEXMEDETOMIDINE (PRECEDEX) IN NS 20 MCG/5ML (4 MCG/ML) IV SYRINGE
PREFILLED_SYRINGE | INTRAVENOUS | Status: DC | PRN
  Administered 2020-11-10: 4 ug via INTRAVENOUS
  Administered 2020-11-10 (×2): 8 ug via INTRAVENOUS

## 2020-11-10 MED ORDER — PHENYLEPHRINE HCL (PRESSORS) 10 MG/ML IV SOLN
INTRAVENOUS | Status: DC | PRN
  Administered 2020-11-10 (×3): 100 ug via INTRAVENOUS
  Administered 2020-11-10: 50 ug via INTRAVENOUS
  Administered 2020-11-10: 100 ug via INTRAVENOUS
  Administered 2020-11-10: 200 ug via INTRAVENOUS
  Administered 2020-11-10: 150 ug via INTRAVENOUS

## 2020-11-10 MED ORDER — GABAPENTIN 300 MG PO CAPS: 300.0000 mg | ORAL_CAPSULE | ORAL | Status: AC

## 2020-11-10 MED ORDER — HYDROCODONE-ACETAMINOPHEN 5-325 MG PO TABS
1.0000 | ORAL_TABLET | Freq: Once | ORAL | Status: AC
  Administered 2020-11-10: 1 via ORAL

## 2020-11-10 MED ORDER — DEXAMETHASONE SODIUM PHOSPHATE 10 MG/ML IJ SOLN
INTRAMUSCULAR | Status: DC | PRN
  Administered 2020-11-10: 10 mg via INTRAVENOUS

## 2020-11-10 MED ORDER — ROCURONIUM BROMIDE 10 MG/ML (PF) SYRINGE
PREFILLED_SYRINGE | INTRAVENOUS | Status: AC
  Filled 2020-11-10: qty 10

## 2020-11-10 MED ORDER — ONDANSETRON HCL 4 MG/2ML IJ SOLN: 4.0000 mg | Freq: Once | INTRAMUSCULAR | Status: DC | PRN

## 2020-11-10 MED ORDER — HYDROCODONE-ACETAMINOPHEN 5-325 MG PO TABS
ORAL_TABLET | ORAL | Status: AC
  Filled 2020-11-10: qty 1

## 2020-11-10 MED ORDER — BUPIVACAINE LIPOSOME 1.3 % IJ SUSP
INTRAMUSCULAR | Status: AC
  Filled 2020-11-10: qty 20

## 2020-11-10 MED ORDER — SEVOFLURANE IN SOLN
RESPIRATORY_TRACT | Status: AC
  Filled 2020-11-10: qty 250

## 2020-11-10 MED ORDER — EPHEDRINE SULFATE 50 MG/ML IJ SOLN
INTRAMUSCULAR | Status: DC | PRN
  Administered 2020-11-10: 10 mg via INTRAVENOUS
  Administered 2020-11-10 (×2): 5 mg via INTRAVENOUS

## 2020-11-10 MED ORDER — ACETAMINOPHEN 325 MG PO TABS: 650.0000 mg | ORAL_TABLET | Freq: Three times a day (TID) | ORAL | 0 refills | Status: AC | PRN

## 2020-11-10 MED ORDER — SUCCINYLCHOLINE CHLORIDE 200 MG/10ML IV SOSY
PREFILLED_SYRINGE | INTRAVENOUS | Status: AC
  Filled 2020-11-10: qty 10

## 2020-11-10 MED ORDER — HYDROCODONE-ACETAMINOPHEN 5-325 MG PO TABS: 1.0000 | ORAL_TABLET | Freq: Four times a day (QID) | ORAL | 0 refills | Status: DC | PRN

## 2020-11-10 MED ORDER — CHLORHEXIDINE GLUCONATE 0.12 % MT SOLN: 15.0000 mL | Freq: Once | OROMUCOSAL | Status: AC

## 2020-11-10 MED ORDER — FAMOTIDINE 20 MG PO TABS
ORAL_TABLET | ORAL | Status: AC
  Administered 2020-11-10: 20 mg via ORAL
  Filled 2020-11-10: qty 1

## 2020-11-10 MED ORDER — GLYCOPYRROLATE 0.2 MG/ML IJ SOLN
INTRAMUSCULAR | Status: DC | PRN
  Administered 2020-11-10: .1 mg via INTRAVENOUS

## 2020-11-10 MED ORDER — ACETAMINOPHEN 500 MG PO TABS: 1000.0000 mg | ORAL_TABLET | ORAL | Status: AC

## 2020-11-10 MED ORDER — FENTANYL CITRATE (PF) 100 MCG/2ML IJ SOLN
INTRAMUSCULAR | Status: DC | PRN
  Administered 2020-11-10 (×2): 50 ug via INTRAVENOUS

## 2020-11-10 MED ORDER — CEFAZOLIN SODIUM-DEXTROSE 2-4 GM/100ML-% IV SOLN
INTRAVENOUS | Status: AC
  Filled 2020-11-10: qty 100

## 2020-11-10 MED ORDER — PHENYLEPHRINE HCL (PRESSORS) 10 MG/ML IV SOLN
INTRAVENOUS | Status: AC
  Filled 2020-11-10: qty 1

## 2020-11-10 MED ORDER — FAMOTIDINE 20 MG PO TABS: 20.0000 mg | ORAL_TABLET | Freq: Once | ORAL | Status: AC

## 2020-11-10 MED ORDER — CHLORHEXIDINE GLUCONATE CLOTH 2 % EX PADS: 6.0000 | MEDICATED_PAD | Freq: Once | CUTANEOUS | Status: DC

## 2020-11-10 MED ORDER — ACETAMINOPHEN 500 MG PO TABS
ORAL_TABLET | ORAL | Status: AC
  Administered 2020-11-10: 1000 mg via ORAL
  Filled 2020-11-10: qty 2

## 2020-11-10 MED ORDER — PROPOFOL 10 MG/ML IV BOLUS
INTRAVENOUS | Status: DC | PRN
  Administered 2020-11-10: 150 mg via INTRAVENOUS

## 2020-11-10 MED ORDER — IBUPROFEN 800 MG PO TABS: 800.0000 mg | ORAL_TABLET | Freq: Three times a day (TID) | ORAL | 0 refills | Status: DC | PRN

## 2020-11-10 MED ORDER — FENTANYL CITRATE (PF) 100 MCG/2ML IJ SOLN: 25.0000 ug | INTRAMUSCULAR | Status: DC | PRN

## 2020-11-10 MED ORDER — FENTANYL CITRATE (PF) 100 MCG/2ML IJ SOLN
INTRAMUSCULAR | Status: AC
  Filled 2020-11-10: qty 2

## 2020-11-10 MED ORDER — BUPIVACAINE LIPOSOME 1.3 % IJ SUSP
INTRAMUSCULAR | Status: DC | PRN
  Administered 2020-11-10: 20 mL

## 2020-11-10 MED ORDER — GABAPENTIN 300 MG PO CAPS
ORAL_CAPSULE | ORAL | Status: AC
  Administered 2020-11-10: 300 mg via ORAL
  Filled 2020-11-10: qty 1

## 2020-11-10 MED ORDER — MIDAZOLAM HCL 2 MG/2ML IJ SOLN
INTRAMUSCULAR | Status: DC | PRN
  Administered 2020-11-10: 2 mg via INTRAVENOUS

## 2020-11-10 MED ORDER — BUPIVACAINE-EPINEPHRINE 0.5% -1:200000 IJ SOLN
INTRAMUSCULAR | Status: DC | PRN
  Administered 2020-11-10: 10 mL

## 2020-11-10 MED ORDER — EPHEDRINE 5 MG/ML INJ
INTRAVENOUS | Status: AC
  Filled 2020-11-10: qty 10

## 2020-11-10 MED ORDER — LIDOCAINE HCL (PF) 2 % IJ SOLN
INTRAMUSCULAR | Status: AC
  Filled 2020-11-10: qty 5

## 2020-11-10 MED ORDER — DOCUSATE SODIUM 100 MG PO CAPS: 100.0000 mg | ORAL_CAPSULE | Freq: Two times a day (BID) | ORAL | 0 refills | Status: AC | PRN

## 2020-11-10 MED ORDER — MIDAZOLAM HCL 2 MG/2ML IJ SOLN
INTRAMUSCULAR | Status: AC
  Filled 2020-11-10: qty 2

## 2020-11-10 MED ORDER — ONDANSETRON HCL 4 MG/2ML IJ SOLN
INTRAMUSCULAR | Status: AC
  Filled 2020-11-10: qty 2

## 2020-11-10 MED ORDER — CEFAZOLIN SODIUM-DEXTROSE 2-4 GM/100ML-% IV SOLN
2.0000 g | INTRAVENOUS | Status: AC
  Administered 2020-11-10: 2 g via INTRAVENOUS

## 2020-11-10 MED ORDER — ONDANSETRON HCL 4 MG/2ML IJ SOLN
INTRAMUSCULAR | Status: DC | PRN
  Administered 2020-11-10: 4 mg via INTRAVENOUS

## 2020-11-10 MED ORDER — ROCURONIUM BROMIDE 100 MG/10ML IV SOLN
INTRAVENOUS | Status: DC | PRN
  Administered 2020-11-10: 50 mg via INTRAVENOUS

## 2020-11-10 MED ORDER — DEXAMETHASONE SODIUM PHOSPHATE 10 MG/ML IJ SOLN
INTRAMUSCULAR | Status: AC
  Filled 2020-11-10: qty 1

## 2020-11-10 MED ORDER — CELECOXIB 200 MG PO CAPS: 200.0000 mg | ORAL_CAPSULE | ORAL | Status: AC

## 2020-11-10 MED ORDER — LACTATED RINGERS IV SOLN: INTRAVENOUS | Status: DC

## 2020-11-10 SURGICAL SUPPLY — 52 items
ADH SKN CLS APL DERMABOND .7 (GAUZE/BANDAGES/DRESSINGS) ×1
APL PRP STRL LF DISP 70% ISPRP (MISCELLANEOUS) ×1
BAG INFUSER PRESSURE 100CC (MISCELLANEOUS) IMPLANT
BLADE SURG SZ11 CARB STEEL (BLADE) ×3 IMPLANT
BNDG GAUZE 4.5X4.1 6PLY STRL (MISCELLANEOUS) ×3 IMPLANT
CANISTER SUCT 1200ML W/VALVE (MISCELLANEOUS) IMPLANT
CHLORAPREP W/TINT 26 (MISCELLANEOUS) ×3 IMPLANT
COVER TIP SHEARS 8 DVNC (MISCELLANEOUS) ×1 IMPLANT
COVER TIP SHEARS 8MM DA VINCI (MISCELLANEOUS) ×2
COVER WAND RF STERILE (DRAPES) ×6 IMPLANT
DEFOGGER SCOPE WARMER CLEARIFY (MISCELLANEOUS) ×3 IMPLANT
DERMABOND ADVANCED (GAUZE/BANDAGES/DRESSINGS) ×2
DERMABOND ADVANCED .7 DNX12 (GAUZE/BANDAGES/DRESSINGS) ×1 IMPLANT
DRAPE ARM DVNC X/XI (DISPOSABLE) ×3 IMPLANT
DRAPE COLUMN DVNC XI (DISPOSABLE) ×1 IMPLANT
DRAPE DA VINCI XI ARM (DISPOSABLE) ×6
DRAPE DA VINCI XI COLUMN (DISPOSABLE) ×2
ELECT CAUTERY BLADE 6.4 (BLADE) IMPLANT
ELECT REM PT RETURN 9FT ADLT (ELECTROSURGICAL) ×3
ELECTRODE REM PT RTRN 9FT ADLT (ELECTROSURGICAL) ×1 IMPLANT
GLOVE BIOGEL PI IND STRL 7.0 (GLOVE) ×4 IMPLANT
GLOVE BIOGEL PI INDICATOR 7.0 (GLOVE) ×8
GLOVE SURG SYN 6.5 ES PF (GLOVE) ×12 IMPLANT
GOWN STRL REUS W/ TWL LRG LVL3 (GOWN DISPOSABLE) ×4 IMPLANT
GOWN STRL REUS W/TWL LRG LVL3 (GOWN DISPOSABLE) ×12
IRRIGATOR SUCT 8 DISP DVNC XI (IRRIGATION / IRRIGATOR) IMPLANT
IRRIGATOR SUCTION 8MM XI DISP (IRRIGATION / IRRIGATOR)
IV NS 1000ML (IV SOLUTION)
IV NS 1000ML BAXH (IV SOLUTION) IMPLANT
LABEL OR SOLS (LABEL) IMPLANT
MANIFOLD NEPTUNE II (INSTRUMENTS) ×3 IMPLANT
MESH 3DMAX 4X6 LT LRG (Mesh General) ×2 IMPLANT
MESH 3DMAX 4X6 RT LRG (Mesh General) ×2 IMPLANT
MESH 3DMAX MID 4X6 LT LRG (Mesh General) ×1 IMPLANT
MESH 3DMAX MID 4X6 RT LRG (Mesh General) ×1 IMPLANT
NEEDLE HYPO 22GX1.5 SAFETY (NEEDLE) ×3 IMPLANT
NEEDLE INSUFFLATION 14GA 120MM (NEEDLE) ×3 IMPLANT
OBTURATOR OPTICAL STANDARD 8MM (TROCAR) ×2
OBTURATOR OPTICAL STND 8 DVNC (TROCAR) ×1
OBTURATOR OPTICALSTD 8 DVNC (TROCAR) ×1 IMPLANT
PACK LAP CHOLECYSTECTOMY (MISCELLANEOUS) ×3 IMPLANT
PENCIL ELECTRO HAND CTR (MISCELLANEOUS) ×3 IMPLANT
SEAL CANN UNIV 5-8 DVNC XI (MISCELLANEOUS) ×3 IMPLANT
SEAL XI 5MM-8MM UNIVERSAL (MISCELLANEOUS) ×6
SET TUBE SMOKE EVAC HIGH FLOW (TUBING) ×3 IMPLANT
SOLUTION ELECTROLUBE (MISCELLANEOUS) ×3 IMPLANT
SUT MNCRL AB 4-0 PS2 18 (SUTURE) ×3 IMPLANT
SUT VIC AB 2-0 SH 27 (SUTURE) ×3
SUT VIC AB 2-0 SH 27XBRD (SUTURE) ×1 IMPLANT
SUT VLOC 90 6 CV-15 VIOLET (SUTURE) ×4 IMPLANT
SUT VLOC 90 6" CV-15 VIOLET (SUTURE) ×2
SYR 30ML LL (SYRINGE) ×3 IMPLANT

## 2020-11-10 NOTE — H&P (Signed)
Subjective:   CC: Non-recurrent bilateral inguinal hernia without obstruction or gangrene [K40.20]  HPI: Carl Townsend is a 74 y.o. male who was referred by Lisa Roca* for evaluation of above. Symptoms were first noted a few years ago. Pain is dull and slightly worsening lately, confined to the left groin, without radiation. Associated with nothing specific, exacerbated by nothing specific Lump is reducible.   Past Medical History: has a past medical history of Colon cancer (CMS-HCC), ED (erectile dysfunction), Hyperlipidemia, Hyperplastic colon polyp (03/13/2016), Hypertension, and Tubular adenoma of colon, unspecified (03/13/2016).  Past Surgical History:  Past Surgical History:  Procedure Laterality Date  . COLON SURGERY  Rt Colectomy and Sigmoid Colectomy 04/2005 for Colon Ca  . COLONOSCOPY 09/17/11  Multiple Divertcs, End to end anastamosis proximal Transverse Colon,and stricture Proximal Sigmoid  . COLONOSCOPY 03/13/2016  Tubular adenoma/Hyperplastic colon polyp/Repeat 42yrs/MUS  . COLONOSCOPY 03/09/2020  [place] Adenomatous Polyp; Diverticulosis: CBF 02/2025  . Rt foot surgery  . VITREOUS RETINAL SURGERY 2018   Family History: family history includes Colon polyps in his mother; Heart failure in his father and mother; High blood pressure (Hypertension) in an other family member; Myocardial Infarction (Heart attack) in his brother; Stroke in his brother.  Social History: reports that he quit smoking about 21 years ago. He has never used smokeless tobacco. He reports current alcohol use. No history on file for drug use.  Current Medications: has a current medication list which includes the following prescription(s): amlodipine, aspirin, atenolol, cyanocobalamin, ergocalciferol (vitamin d2), multivitamin, sildenafil, and candesartan.  Allergies:  Allergies as of 06/22/2020  . (No Known Allergies)   ROS:  A 15 point review of systems was performed and pertinent positives  and negatives noted in HPI  Objective:    BP (!) 183/96  Pulse 83  Temp 36.6 C (97.8 F) (Oral)  Ht 180.3 cm (5\' 11" )  Wt 66.6 kg (146 lb 12.8 oz)  BMI 20.47 kg/m   Constitutional : alert, appears stated age, cooperative and no distress  Lymphatics/Throat: no asymmetry, masses, or scars  Respiratory: clear to auscultation bilaterally  Cardiovascular: regular rate and rhythm  Gastrointestinal: soft, non-tender; bowel sounds normal; no masses, no organomegaly. inguinal hernia noted. moderate, reducible, no overlying skin changes and LEFT, POSSIBLE SMALL ONE ON RIGHT AS WELL  Musculoskeletal: Steady gait and movement  Skin: Cool and moist,  Psychiatric: Normal affect, non-agitated, not confused    LABS:  N/A   RADS: N/A Assessment:    Non-recurrent bilateral inguinal hernia without obstruction or gangrene [K40.20]  Plan:    1. Non-recurrent bilateral inguinal hernia without obstruction or gangrene [K40.20]  Discussed the risk of surgery including recurrence, which can be up to 50% in the case of incisional or complex hernias, possible use of prosthetic materials (mesh) and the increased risk of mesh infxn if used, bleeding, chronic pain, post-op infxn, post-op SBO or ileus, and possible re-operation to address said risks. The risks of general anesthetic, if used, includes MI, CVA, sudden death or even reaction to anesthetic medications also discussed. Alternatives include continued observation. Benefits include possible symptom relief, prevention of incarceration, strangulation, enlargement in size over time, and the risk of emergency surgery in the face of strangulation.   Typical post-op recovery time of 3-5 days with 2 weeks of activity restrictions were also discussed.  ED return precautions given for sudden increase in pain, size of hernia with accompanying fever, nausea, and/or vomiting.  The patient verbalized understanding and all questions were answered to the  patient's satisfaction.  2. Patient has elected to proceed with surgical treatment. Procedure will be scheduled. Written consent was obtained.. LEFT, POSSIBLE BILATERAL, robotic assisted laparoscopic. Hx of colon resection x2, so chance we may need to convert to open discussed as well. Will only fix left side if we convert to open. Wife and pt verbalized understanding. They also requested cardiology clearence as well, which we can obtain. Supposed to see [facility] for routine exam.

## 2020-11-10 NOTE — Discharge Instructions (Signed)
Hernia repair, Care After This sheet gives you information about how to care for yourself after your procedure. Your health care provider may also give you more specific instructions. If you have problems or questions, contact your health care provider. What can I expect after the procedure? After your procedure, it is common to have the following:  Pain in your abdomen, especially in the incision areas. You will be given medicine to control the pain.  Tiredness. This is a normal part of the recovery process. Your energy level will return to normal over the next several weeks.  Changes in your bowel movements, such as constipation or needing to go more often. Talk with your health care provider about how to manage this. Follow these instructions at home: Medicines   tylenol and advil as needed for discomfort.  Please alternate between the two every four hours as needed for pain.  RESUME ASPIRIN IN 48HRS     Use narcotics, if prescribed, only when tylenol and motrin is not enough to control pain.   325-650mg  every 8hrs to max of 3000mg /24hrs (including the 325mg  in every norco dose) for the tylenol.     Advil up to 800mg  per dose every 8hrs as needed for pain.    PLEASE RECORD NUMBER OF PILLS TAKEN UNTIL NEXT FOLLOW UP APPT.  THIS WILL HELP DETERMINE HOW READY YOU ARE TO BE RELEASED FROM ANY ACTIVITY RESTRICTIONS  Do not drive or use heavy machinery while taking prescription pain medicine.  Do not drink alcohol while taking prescription pain medicine.  Incision care     Follow instructions from your health care provider about how to take care of your incision areas. Make sure you: ? Keep your incisions clean and dry. ? Wash your hands with soap and water before and after applying medicine to the areas, and before and after changing your bandage (dressing). If soap and water are not available, use hand sanitizer. ? Change your dressing as told by your health care provider. ? Leave  stitches (sutures), skin glue, or adhesive strips in place. These skin closures may need to stay in place for 2 weeks or longer. If adhesive strip edges start to loosen and curl up, you may trim the loose edges. Do not remove adhesive strips completely unless your health care provider tells you to do that.  Do not wear tight clothing over the incisions. Tight clothing may rub and irritate the incision areas, which may cause the incisions to open.  Do not take baths, swim, or use a hot tub until your health care provider approves. OK TO SHOWER IN 24HRS.    Check your incision area every day for signs of infection. Check for: ? More redness, swelling, or pain. ? More fluid or blood. ? Warmth. ? Pus or a bad smell. Activity  Avoid lifting anything that is heavier than 10 lb (4.5 kg) for 2 weeks or until your health care provider says it is okay.  No pushing/pulling greater than 30lbs  You may resume normal activities as told by your health care provider. Ask your health care provider what activities are safe for you.  Take rest breaks during the day as needed. Eating and drinking  Follow instructions from your health care provider about what you can eat after surgery.  To prevent or treat constipation while you are taking prescription pain medicine, your health care provider may recommend that you: ? Drink enough fluid to keep your urine clear or pale yellow. ? Take  over-the-counter or prescription medicines. ? Eat foods that are high in fiber, such as fresh fruits and vegetables, whole grains, and beans. ? Limit foods that are high in fat and processed sugars, such as fried and sweet foods. General instructions  Ask your health care provider when you will need an appointment to get your sutures or staples removed.  Keep all follow-up visits as told by your health care provider. This is important. Contact a health care provider if:  You have more redness, swelling, or pain around  your incisions.  You have more fluid or blood coming from the incisions.  Your incisions feel warm to the touch.  You have pus or a bad smell coming from your incisions or your dressing.  You have a fever.  You have an incision that breaks open (edges not staying together) after sutures or staples have been removed. Get help right away if:  You develop a rash.  You have chest pain or difficulty breathing.  You have pain or swelling in your legs.  You feel light-headed or you faint.  Your abdomen swells (becomes distended).  You have nausea or vomiting.  You have blood in your stool (feces). This information is not intended to replace advice given to you by your health care provider. Make sure you discuss any questions you have with your health care provider. Document Released: 07/06/2005 Document Revised: 09/05/2018 Document Reviewed: 09/17/2016 Elsevier Interactive Patient Education  2019 [place]   1) The drugs that you were given will stay in your system until tomorrow so for the next 24 hours you should not:  A) Drive an automobile B) Make any legal decisions C) Drink any alcoholic beverage   2) You may resume regular meals tomorrow.  Today it is better to start with liquids and gradually work up to solid foods.  You may eat anything you prefer, but it is better to start with liquids, then soup and crackers, and gradually work up to solid foods.   3) Please notify your doctor immediately if you have any unusual bleeding, trouble breathing, redness and pain at the surgery site, drainage, fever, or pain not relieved by medication.    4) Additional Instructions:        Please contact your physician with any problems or Same Day Surgery at [phone], Monday through Friday 6 am to 4 pm, or [place] at [facility] number at [phone].

## 2020-11-10 NOTE — Anesthesia Preprocedure Evaluation (Signed)
Anesthesia Evaluation  Patient identified by MRN, date of birth, ID band Patient awake    Reviewed: Allergy & Precautions, H&P , NPO status , Patient's Chart, lab work & pertinent test results, reviewed documented beta blocker date and time   Airway Mallampati: II  TM Distance: >3 FB Neck ROM: full    Dental  (+) Upper Dentures, Lower Dentures, Poor Dentition   Pulmonary neg pulmonary ROS, former smoker,    Pulmonary exam normal        Cardiovascular Exercise Tolerance: Good hypertension, On Medications negative cardio ROS Normal cardiovascular exam Rhythm:regular Rate:Normal     Neuro/Psych negative neurological ROS  negative psych ROS   GI/Hepatic negative GI ROS, Neg liver ROS,   Endo/Other  negative endocrine ROS  Renal/GU CRFRenal disease  negative genitourinary   Musculoskeletal   Abdominal   Peds  Hematology negative hematology ROS (+)   Anesthesia Other Findings Past Medical History: No date: Arthritis     Comment:  neck and shoulders No date: Chronic kidney disease     Comment:  chronic kidney disease - stage 3, GFR 30-59 ml/min No date: Colon cancer (HCC) No date: ED (erectile dysfunction) 08/23/2020: History of 2019 novel coronavirus disease (COVID-19) No date: Hyperlipidemia No date: Hypertension No date: Inguinal hernia No date: Pre-diabetes Past Surgical History: 05/06: COLON SURGERY; Right     Comment:  colectomy and sigmoid colectomy  03/13/2016: COLONOSCOPY WITH PROPOFOL; N/A     Comment:  Procedure: COLONOSCOPY WITH PROPOFOL;  Surgeon: Lollie Sails, MD;  Location: [facility] ENDOSCOPY;  Service:               Endoscopy;  Laterality: N/A; 03/09/2020: COLONOSCOPY WITH PROPOFOL; N/A     Comment:  Procedure: COLONOSCOPY WITH PROPOFOL;  Surgeon: [place],               Benay Pike, MD;  Location: [facility] ENDOSCOPY;  Service:               Gastroenterology;  Laterality: N/A; No date: EYE  SURGERY; Left     Comment:  vitreous retinal surgery No date: right foot surgery BMI    Body Mass Index: 20.22 kg/m     Reproductive/Obstetrics negative OB ROS                             Anesthesia Physical Anesthesia Plan  ASA: III  Anesthesia Plan: General ETT   Post-op Pain Management:    Induction:   PONV Risk Score and Plan: 3  Airway Management Planned:   Additional Equipment:   Intra-op Plan:   Post-operative Plan:   Informed Consent: I have reviewed the patients History and Physical, chart, labs and discussed the procedure including the risks, benefits and alternatives for the proposed anesthesia with the patient or authorized representative who has indicated his/her understanding and acceptance.     Dental Advisory Given  Plan Discussed with: CRNA  Anesthesia Plan Comments:         Anesthesia Quick Evaluation

## 2020-11-10 NOTE — Op Note (Signed)
Preoperative diagnosis: Left inguinal Hernia, reducible  Postoperative diagnosis: Bilateral direct inguinal Hernia, reducible  Procedure: Robotic assisted laparoscopic bilateral inguinal hernia repair with mesh  Anesthesia: General  Surgeon: Dr. Lysle Pearl  Wound Classification: Clean  Specimen: none  Complications: None  Estimated Blood Loss: 53mL   Indications:  inguinal hernia. Repair was indicated to avoid complications of incarceration, obstruction and pain, and a prosthetic mesh repair was elected.  See H&P for further details.  Findings: 1. Vas Deferens and cord structures identified and preserved 2. Bard 3D max medium weight mesh used for repair 3. Adequate hemostasis achieved  Description of procedure: The patient was taken to the operating room. A time-out was completed verifying correct patient, procedure, site, positioning, and implant(s) and/or special equipment prior to beginning this procedure.  Area was prepped and draped in the usual sterile fashion. An incision was marked 20 cm above the pubic tubercle, slightly above the umbilicus  Scrotum wrapped in Kerlix roll.  Veress needle inserted at palmer's point.  Saline drop test noted to be positive with gradual increase in pressure after initiation of gas insufflation.  15 mm of pressure was achieved prior to removing the Veress needle and then placing a 8 mm port via the Optiview technique through the supraumbilical site.  Inspection of the area afterwards noted no injury to the surrounding organs during insertion of the needle and the port.  2 port sites were marked 8 cm to the lateral sides of the initial port, and a 8 mm robotic port was placed on the left side, another 8 mm robotic port on the right side under direct supervision.  Local anesthesia  infused to the preplanned incision sites prior to insertion of the port.  The [place] was then brought into the operative field and docked to the  ports.  Examination of the abdominal cavity noted a bilateral direct inguinal hernia.  Left side addressed first.  A peritoneal flap was created approximately 8cm cephalad to the defect by using scissors with electrocautery.  Dissection was carried down towards the pubic tubercle, developing the myopectineal orifice view.  Laterally the flap was carried towards the ASIS.  Large hernia sac was noted, which carefully dissected away from the adjacent tissues to be fully reduced out of hernia cavity.  Any bleeding was controlled with combination of electrocautery and manual pressure.    After confirming adequate dissection and the peritoneal reflection completely down and away from the cord structures, a Large Bard 3DMax medium weight mesh was placed within the anterior abdominal wall, secured in place using 2-0 Vicryl on an SH needle immediately above the pubic tubercle.  After noting proper placement of the mesh with the peritoneal reflection deep to it, the previously created peritoneal flap was secured back up to the anterior abdominal wall using running 3-0 V-Lock.  Both needles were then removed out of the abdominal cavity.  Right side addressed next. A peritoneal flap was created approximately 8cm cephalad to the defect by using scissors with electrocautery.  Dissection was carried down towards the pubic tubercle, developing the myopectineal orifice view.  Laterally the flap was carried towards the ASIS.  Large hernia sac was noted, which carefully dissected away from the adjacent tissues to be fully reduced out of hernia cavity.  Any bleeding was controlled with combination of electrocautery and manual pressure.    After confirming adequate dissection and the peritoneal reflection completely down and away from the cord structures, a Large Bard 3DMax medium weight  mesh was placed within the anterior abdominal wall, secured in place using 2-0 Vicryl on an SH needle immediately above the pubic tubercle.   After noting proper placement of the mesh with the peritoneal reflection deep to it, the previously created peritoneal flap was secured back up to the anterior abdominal wall using running 3-0 V-Lock.  Both needles were then removed out of the abdominal cavity, Xi platform undocked from the ports and removed off of operative field.  exparel infused as ilioinguinal block bilaterally.  Abdomen then desufflated and ports removed. All the skin incisions were then closed with a subcuticular stitch of Monocryl 4-0. Dermabond was applied. The testis was gently pulled down into its anatomic position in the scrotum.  The patient tolerated the procedure well and was taken to the postanesthesia care unit in stable condition. Sponge and instrument count correct at end of procedure.

## 2020-11-10 NOTE — Interval H&P Note (Signed)
History and Physical Interval Note:  11/10/2020 7:25 AM  Carl Townsend  has presented today for surgery, with the diagnosis of K40.20 Non recurrent bilateral inguinal hernia without obstruction or gangrene.  The various methods of treatment have been discussed with the patient and family. After consideration of risks, benefits and other options for treatment, the patient has consented to  Procedure(s): XI ROBOTIC [place] (Bilateral) as a surgical intervention.  The patient's history has been reviewed, patient examined, no change in status, stable for surgery.  I have reviewed the patient's chart and labs.  Questions were answered to the patient's satisfaction.     Carl Townsend

## 2020-11-10 NOTE — Anesthesia Procedure Notes (Addendum)
Procedure Name: Intubation Date/Time: 11/10/2020 7:57 AM Performed by: Anders Simmonds, RN Pre-anesthesia Checklist: Patient identified, Emergency Drugs available, Suction available and Patient being monitored Patient Re-evaluated:Patient Re-evaluated prior to induction Oxygen Delivery Method: Circle system utilized Preoxygenation: Pre-oxygenation with 100% oxygen Induction Type: IV induction Ventilation: Mask ventilation without difficulty Laryngoscope Size: Miller and 2 Grade View: Grade I Tube type: Oral Tube size: 7.5 mm Number of attempts: 1 Airway Equipment and Method: Stylet Placement Confirmation: ETT inserted through vocal cords under direct vision,  positive ETCO2 and breath sounds checked- equal and bilateral Secured at: 22 cm Tube secured with: Tape Dental Injury: Teeth and Oropharynx as per pre-operative assessment

## 2020-11-10 NOTE — Transfer of Care (Signed)
Immediate Anesthesia Transfer of Care Note  Patient: Carl Townsend  Procedure(s) Performed: XI ROBOTIC ASSISTED INGUINAL HERNIA REPAIR WITH MESH (Bilateral Abdomen)  Patient Location: PACU  Anesthesia Type:General  Level of Consciousness: awake, alert  and patient cooperative  Airway & Oxygen Therapy: Patient Spontanous Breathing and Patient connected to face mask oxygen  Post-op Assessment: Report given to RN and Post -op Vital signs reviewed and stable  Post vital signs: Reviewed and stable  Last Vitals:  Vitals Value Taken Time  BP 144/79 11/10/20 1031  Temp    Pulse 78 11/10/20 1034  Resp 21 11/10/20 1034  SpO2 100 % 11/10/20 1034  Vitals shown include unvalidated device data.  Last Pain:  Vitals:   11/10/20 0650  TempSrc: Temporal  PainSc: 0-No pain         Complications: No complications documented.

## 2020-11-11 NOTE — Anesthesia Postprocedure Evaluation (Signed)
Anesthesia Post Note  Patient: Carl Townsend  Procedure(s) Performed: XI ROBOTIC ASSISTED INGUINAL HERNIA REPAIR WITH MESH (Bilateral Abdomen)  Patient location during evaluation: PACU Anesthesia Type: General Level of consciousness: awake and alert Pain management: pain level controlled Vital Signs Assessment: post-procedure vital signs reviewed and stable Respiratory status: spontaneous breathing, nonlabored ventilation, respiratory function stable and patient connected to nasal cannula oxygen Cardiovascular status: blood pressure returned to baseline and stable Postop Assessment: no apparent nausea or vomiting Anesthetic complications: no   No complications documented.   Last Vitals:  Vitals:   11/10/20 1200 11/10/20 1230  BP: (!) 101/52 (!) 119/57  Pulse: 85 82  Resp: 18 18  Temp:    SpO2: 98% 98%    Last Pain:  Vitals:   11/11/20 0820  TempSrc:   PainSc: 4                  Arita Miss

## 2020-12-16 DIAGNOSIS — R200 Anesthesia of skin: Secondary | ICD-10-CM | POA: Insufficient documentation

## 2020-12-16 DIAGNOSIS — Z87891 Personal history of nicotine dependence: Secondary | ICD-10-CM | POA: Insufficient documentation

## 2021-01-08 ENCOUNTER — Other Ambulatory Visit: Payer: Self-pay

## 2021-01-08 ENCOUNTER — Encounter: Payer: Self-pay | Admitting: Emergency Medicine

## 2021-01-08 ENCOUNTER — Emergency Department: Payer: Medicare Other

## 2021-01-08 ENCOUNTER — Emergency Department
Admission: EM | Admit: 2021-01-08 | Discharge: 2021-01-08 | Disposition: A | Discharge status: Home / Self Care | Payer: Medicare Other | Attending: Emergency Medicine | Admitting: Emergency Medicine

## 2021-01-08 DIAGNOSIS — Z8616 Personal history of COVID-19: Secondary | ICD-10-CM | POA: Insufficient documentation

## 2021-01-08 DIAGNOSIS — N183 Chronic kidney disease, stage 3 unspecified: Secondary | ICD-10-CM | POA: Diagnosis not present

## 2021-01-08 DIAGNOSIS — J441 Chronic obstructive pulmonary disease with (acute) exacerbation: Secondary | ICD-10-CM | POA: Insufficient documentation

## 2021-01-08 DIAGNOSIS — Z87891 Personal history of nicotine dependence: Secondary | ICD-10-CM | POA: Diagnosis not present

## 2021-01-08 DIAGNOSIS — Z85038 Personal history of other malignant neoplasm of large intestine: Secondary | ICD-10-CM | POA: Diagnosis not present

## 2021-01-08 DIAGNOSIS — R0602 Shortness of breath: Secondary | ICD-10-CM | POA: Diagnosis present

## 2021-01-08 DIAGNOSIS — Z79899 Other long term (current) drug therapy: Secondary | ICD-10-CM | POA: Diagnosis not present

## 2021-01-08 DIAGNOSIS — R079 Chest pain, unspecified: Secondary | ICD-10-CM

## 2021-01-08 DIAGNOSIS — I129 Hypertensive chronic kidney disease with stage 1 through stage 4 chronic kidney disease, or unspecified chronic kidney disease: Secondary | ICD-10-CM | POA: Diagnosis not present

## 2021-01-08 LAB — CBC
HCT: 45.1 % (ref 39.0–52.0)
Hemoglobin: 15.2 g/dL (ref 13.0–17.0)
MCH: 33 pg (ref 26.0–34.0)
MCHC: 33.7 g/dL (ref 30.0–36.0)
MCV: 97.8 fL (ref 80.0–100.0)
Platelets: 249 10*3/uL (ref 150–400)
RBC: 4.61 MIL/uL (ref 4.22–5.81)
RDW: 13.4 % (ref 11.5–15.5)
WBC: 4.6 10*3/uL (ref 4.0–10.5)
nRBC: 0 % (ref 0.0–0.2)

## 2021-01-08 LAB — BASIC METABOLIC PANEL
Anion gap: 15 (ref 5–15)
BUN: 14 mg/dL (ref 8–23)
CO2: 26 mmol/L (ref 22–32)
Calcium: 9.8 mg/dL (ref 8.9–10.3)
Chloride: 94 mmol/L — ABNORMAL LOW (ref 98–111)
Creatinine, Ser: 1.52 mg/dL — ABNORMAL HIGH (ref 0.61–1.24)
GFR, Estimated: 48 mL/min — ABNORMAL LOW (ref >60)
Glucose, Bld: 131 mg/dL — ABNORMAL HIGH (ref 70–99)
Potassium: 4.2 mmol/L (ref 3.5–5.1)
Sodium: 135 mmol/L (ref 135–145)

## 2021-01-08 LAB — TROPONIN I (HIGH SENSITIVITY)
Troponin I (High Sensitivity): 14 ng/L (ref <18)
Troponin I (High Sensitivity): 35 ng/L — ABNORMAL HIGH (ref <18)

## 2021-01-08 MED ORDER — PREDNISONE 20 MG PO TABS
60.0000 mg | ORAL_TABLET | Freq: Once | ORAL | Status: AC
  Administered 2021-01-08: 60 mg via ORAL
  Filled 2021-01-08: qty 3

## 2021-01-08 MED ORDER — PREDNISONE 50 MG PO TABS: 50.0000 mg | ORAL_TABLET | Freq: Every day | ORAL | 0 refills | Status: DC

## 2021-01-08 MED ORDER — ALBUTEROL SULFATE HFA 108 (90 BASE) MCG/ACT IN AERS: 2.0000 | INHALATION_SPRAY | RESPIRATORY_TRACT | 0 refills | Status: DC | PRN

## 2021-01-08 NOTE — ED Triage Notes (Signed)
Pt c/o generalized CP that started Thursday. Pt states pain is a pressure in nature. Pt states hx of HTN and cardiac hx and has had CP before but never this bad. Pt also c/o bilateral arm numbness.

## 2021-01-08 NOTE — ED Provider Notes (Signed)
[facility] Emergency Department Provider Note  ____________________________________________  Time seen: Approximately 3:34 PM  I have reviewed the triage vital signs and the nursing notes.   HISTORY  Chief Complaint Chest Pain    HPI Carl Townsend is a 75 y.o. male who presents the emergency department complaining of chest tightness, shortness of breath x3 days.  Patient states that he has a pressure sensation in the upper chest that is described as a chest tightness.  He has had some shortness of breath but no cough.  No fevers or chills, nasal congestion, sore throat.  States again that this is in the upper chest, not specifically substernal.  Patient with no radiation to the jaw or back.  No palpitations reported.  Patient has no GI or urinary complaints.  No medications for his complaint prior to arrival.  Patient does have a cardiologist, saw him less than a month ago with no reported issues.  Patient states that he has a "heart history" but unsure exactly what this entails.  History of CKD, colon cancer, hyperlipidemia, hypertension.         Past Medical History:  Diagnosis Date  . Arthritis    neck and shoulders  . Chronic kidney disease    chronic kidney disease - stage 3, GFR 30-59 ml/min  . Colon cancer ([place])   . ED (erectile dysfunction)   . History of 2019 novel coronavirus disease (COVID-19) 08/23/2020  . Hyperlipidemia   . Hypertension   . Inguinal hernia   . Pre-diabetes     Patient Active Problem List   Diagnosis Date Noted  . COVID-19 virus infection 08/24/2020    Past Surgical History:  Procedure Laterality Date  . COLON SURGERY Right 05/06   colectomy and sigmoid colectomy   . COLONOSCOPY WITH PROPOFOL N/A 03/13/2016   Procedure: COLONOSCOPY WITH PROPOFOL;  Surgeon: Lollie Sails, MD;  Location: [facility] ENDOSCOPY;  Service: Endoscopy;  Laterality: N/A;  . COLONOSCOPY WITH PROPOFOL N/A 03/09/2020   Procedure: COLONOSCOPY  WITH PROPOFOL;  Surgeon: Toledo, Benay Pike, MD;  Location: [facility] ENDOSCOPY;  Service: Gastroenterology;  Laterality: N/A;  . EYE SURGERY Left    vitreous retinal surgery  . right foot surgery    . XI ROBOTIC ASSISTED INGUINAL HERNIA REPAIR WITH MESH Bilateral 11/10/2020   Procedure: XI ROBOTIC ASSISTED INGUINAL HERNIA REPAIR WITH MESH;  Surgeon: Benjamine Sprague, DO;  Location: [facility] ORS;  Service: General;  Laterality: Bilateral;    Prior to Admission medications   Medication Sig Start Date End Date Taking? Authorizing Provider  albuterol (VENTOLIN HFA) 108 (90 Base) MCG/ACT inhaler Inhale 2 puffs into the lungs every 4 (four) hours as needed for wheezing or shortness of breath. 01/08/21  Yes Raiya Stainback, Charline Bills, PA-C  predniSONE (DELTASONE) 50 MG tablet Take 1 tablet (50 mg total) by mouth daily with breakfast. 01/08/21  Yes Saysha Menta, Charline Bills, PA-C  ascorbic acid (VITAMIN C) 500 MG tablet Take 500 mg by mouth daily.    [provider]  aspirin 81 MG tablet Take 81 mg by mouth daily.    [provider]  atenolol (TENORMIN) 100 MG tablet Take 100 mg by mouth at bedtime. Take blood pressure before taking in the morning or evening    [provider]  candesartan (ATACAND) 16 MG tablet Take 16 mg by mouth daily.    [provider]  Carboxymethylcellulose Sodium (REFRESH LIQUIGEL) 1 % GEL Place 1 drop into both eyes at bedtime.  [provider]  cyanocobalamin 1000 MCG tablet Take 1,000 mcg by mouth daily.    [provider]  ergocalciferol (VITAMIN D2) 1.25 MG (50000 UT) capsule Take 50,000 Units by mouth once a week. Saturday or Sunday    [provider]  Glycerin-Polysorbate 80 (REFRESH DRY EYE THERAPY OP) Place 1 drop into both eyes daily as needed (Dry eye).    [provider]  HYDROcodone-acetaminophen (NORCO) 5-325 MG tablet Take 1 tablet by mouth every 6 (six) hours as needed for up to 6 doses for moderate pain. 11/10/20    Lysle Pearl, Isami, DO  ibuprofen (ADVIL) 800 MG tablet Take 1 tablet (800 mg total) by mouth every 8 (eight) hours as needed for mild pain or moderate pain. 11/10/20   Benjamine Sprague, DO  Multiple Vitamin (MULTIVITAMIN) tablet Take 1 tablet by mouth daily.    [provider]  sildenafil (VIAGRA) 100 MG tablet Take 100 mg by mouth daily as needed for erectile dysfunction.    [provider]  zinc gluconate 50 MG tablet Take 50 mg by mouth daily.    [provider]    Allergies Patient has no known allergies.  History reviewed. No pertinent family history.  Social History Social History   Tobacco Use  . Smoking status: Former Smoker    Quit date: 2000    Years since quitting: 22.0  . Smokeless tobacco: Never Used  Vaping Use  . Vaping Use: Never used  Substance Use Topics  . Alcohol use: No  . Drug use: No     Review of Systems  Constitutional: No fever/chills Eyes: No visual changes. No discharge ENT: No upper respiratory complaints. Cardiovascular: Chest tightness Respiratory: no cough.  Mild SOB. Gastrointestinal: No abdominal pain.  No nausea, no vomiting.  No diarrhea.  No constipation. Musculoskeletal: Negative for musculoskeletal pain. Skin: Negative for rash, abrasions, lacerations, ecchymosis. Neurological: Negative for headaches, focal weakness or numbness.  10 System ROS otherwise negative.  ____________________________________________   PHYSICAL EXAM:  VITAL SIGNS: ED Triage Vitals  Enc Vitals Group     BP 01/08/21 1407 (!) 184/98     Pulse Rate 01/08/21 1407 77     Resp 01/08/21 1407 19     Temp 01/08/21 1407 97.9 F (36.6 C)     Temp Source 01/08/21 1407 Oral     SpO2 01/08/21 1407 99 %     Weight 01/08/21 1403 145 lb (65.8 kg)     Height 01/08/21 1403 5\' 11"  (1.803 m)     Head Circumference --      Peak Flow --      Pain Score 01/08/21 1402 9     Pain Loc --      Pain Edu? --      Excl. in [place]? --      Constitutional:  Alert and oriented. Well appearing and in no acute distress. Eyes: Conjunctivae are normal. PERRL. EOMI. Head: Atraumatic. ENT:      Ears:       Nose: No congestion/rhinnorhea.      Mouth/Throat: Mucous membranes are moist.  Neck: No stridor.   Hematological/Lymphatic/Immunilogical: No cervical lymphadenopathy. Cardiovascular: Normal rate, regular rhythm. Normal S1 and S2.  Good peripheral circulation. Respiratory: Normal respiratory effort without tachypnea or retractions. Lungs CTAB with no appreciable wheezing, rales or rhonchi.Kermit Balo air entry to the bases with no decreased or absent breath sounds. Gastrointestinal: Bowel sounds 4 quadrants. Soft and nontender to palpation. No guarding or rigidity. No palpable  masses. No distention.  Musculoskeletal: Full range of motion to all extremities. No gross deformities appreciated. Neurologic:  Normal speech and language. No gross focal neurologic deficits are appreciated.  Skin:  Skin is warm, dry and intact. No rash noted. Psychiatric: Mood and affect are normal. Speech and behavior are normal. Patient exhibits appropriate insight and judgement.   ____________________________________________   LABS (all labs ordered are listed, but only abnormal results are displayed)  Labs Reviewed  BASIC METABOLIC PANEL - Abnormal; Notable for the following components:      Result Value   Chloride 94 (*)    Glucose, Bld 131 (*)    Creatinine, Ser 1.52 (*)    GFR, Estimated 48 (*)    All other components within normal limits  TROPONIN I (HIGH SENSITIVITY) - Abnormal; Notable for the following components:   Troponin I (High Sensitivity) 35 (*)    All other components within normal limits  CBC  TROPONIN I (HIGH SENSITIVITY)   ____________________________________________  EKG  ED ECG REPORT I, Charline Bills Tyianna Menefee,  personally viewed and interpreted this ECG.   Date: 01/08/2021  EKG Time: 1410 hrs.  Rate: 76 bpm  Rhythm: unchanged from  previous tracings, normal sinus rhythm, sinus arrhythmia  Axis: Normal axis  Intervals:none  ST&T Change: Nonspecific ST changes in the lateral leads.  No acute elevation or depression  No STEMI.  Normal sinus rhythm with sinus arrhythmia.  Nonspecific ST changes.   ____________________________________________  RADIOLOGY I personally viewed and evaluated these images as part of my medical decision making, as well as reviewing the written report by the radiologist.  ED Provider Interpretation: Changes consistent with COPD/emphysema.  No consolidation concerning for pneumonia.  DG Chest 2 View  Result Date: 01/08/2021 CLINICAL DATA:  Chest pain.  Shortness of breath. EXAM: CHEST - 2 VIEW COMPARISON:  September 14, 2011 FINDINGS: Flattening of the hemidiaphragms the lateral view. The heart, hila, mediastinum, lungs, and pleura are otherwise unremarkable. 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 No acute abnormalities. Electronically Signed   By: Dorise Bullion III M.D   On: 01/08/2021 14:36    ____________________________________________    PROCEDURES  Procedure(s) performed:    Procedures    Medications  predniSONE (DELTASONE) tablet 60 mg (has no administration in time range)     ____________________________________________   INITIAL IMPRESSION / ASSESSMENT AND PLAN / ED COURSE  Pertinent labs & imaging results that were available during my care of the patient were reviewed by me and considered in my medical decision making (see chart for details).  Review of the [place] CSRS was performed in accordance of the [place] prior to dispensing any controlled drugs.  Clinical Course as of 01/08/21 1830  Sun Jan 08, 2021  1826 Troponin I (High Sensitivity)(!): 35 Patient's troponin trended from 14-35.  Patient did not have any significant increase of his symptoms.  Patient has remained in normal sinus rhythm in the emergency department.  I discussed  the patient with on-call [facility] clinic cardiologist, Dr. Saralyn Pilar.  At this time it is felt that this is unlikely true cardiac event is likely more heart strain secondary to COPD.  Patient is stable for discharge and follow-up with cardiology as an outpatient. [JC]    Clinical Course User Index [JC] Jammi Morrissette, Charline Bills, PA-C          Patient's diagnosis is consistent with COPD, nonspecific chest pain.  Patient presents emergency department complaining of chest tightness and shortness of breath.  X-ray findings and physical exam are consistent with COPD.  Patient has not had a formal diagnosis and will need to follow-up with primary care pulmonology for pulmonary function test to confirm diagnosis.  However again symptoms, chest x-ray are consistent with COPD and the patient's history of prolonged smoking.  Patient's initial labs were reassuring.  Repeat troponin had trended up from 14-35.  I discussed the patient with on-call cardiologist for [facility] clinic, Dr. Saralyn Pilar.  Both myself and the cardiologist feel that this is unlikely true cardiac event, likely heart strain from respiratory.  At this time no recommendation for admission, Cath Lab or anticoagulation with heparin.  Patient is stable for discharge.  Follow-up with cardiology.  Strict return precautions are discussed with the patient and his wife..  Patient is given ED precautions to return to the ED for any worsening or new symptoms.     ____________________________________________  FINAL CLINICAL IMPRESSION(S) / ED DIAGNOSES  Final diagnoses:  Chronic obstructive pulmonary disease with acute exacerbation (HCC)  Nonspecific chest pain      NEW MEDICATIONS STARTED DURING THIS VISIT:  ED Discharge Orders         Ordered    predniSONE (DELTASONE) 50 MG tablet  Daily with breakfast        01/08/21 1829    albuterol (VENTOLIN HFA) 108 (90 Base) MCG/ACT inhaler  Every 4 hours PRN        01/08/21 1829               This chart was dictated using voice recognition software/Dragon. Despite best efforts to proofread, errors can occur which can change the meaning. Any change was purely unintentional.    Darletta Moll, PA-C 01/08/21 1831    Carrie Mew, MD 01/09/21 [phone]

## 2021-01-08 NOTE — ED Notes (Signed)
Date and time results received: 01/08/21 1648 (use smartphrase ".now" to insert current time)  Test: troponin Critical Value: 35  Name of Provider Notified: Roderic Palau, [place]  Orders Received? Or Actions Taken?: Orders Received - See Orders for details

## 2021-01-11 ENCOUNTER — Other Ambulatory Visit: Payer: Self-pay

## 2021-01-11 ENCOUNTER — Emergency Department: Payer: Medicare Other

## 2021-01-11 ENCOUNTER — Observation Stay
Admit: 2021-01-11 | Discharge: 2021-01-11 | Disposition: A | Discharge status: Home / Self Care | Payer: Medicare Other | Attending: Internal Medicine | Admitting: Internal Medicine

## 2021-01-11 ENCOUNTER — Observation Stay
Admission: EM | Admit: 2021-01-11 | Discharge: 2021-01-12 | Disposition: A | Discharge status: Home / Self Care | Payer: Medicare Other | Attending: Family Medicine | Admitting: Family Medicine

## 2021-01-11 DIAGNOSIS — Z87891 Personal history of nicotine dependence: Secondary | ICD-10-CM | POA: Insufficient documentation

## 2021-01-11 DIAGNOSIS — Z9889 Other specified postprocedural states: Secondary | ICD-10-CM

## 2021-01-11 DIAGNOSIS — N1831 Chronic kidney disease, stage 3a: Secondary | ICD-10-CM | POA: Diagnosis not present

## 2021-01-11 DIAGNOSIS — Z8616 Personal history of COVID-19: Secondary | ICD-10-CM | POA: Insufficient documentation

## 2021-01-11 DIAGNOSIS — E785 Hyperlipidemia, unspecified: Secondary | ICD-10-CM | POA: Diagnosis not present

## 2021-01-11 DIAGNOSIS — I10 Essential (primary) hypertension: Secondary | ICD-10-CM | POA: Diagnosis present

## 2021-01-11 DIAGNOSIS — Z20822 Contact with and (suspected) exposure to covid-19: Secondary | ICD-10-CM | POA: Diagnosis not present

## 2021-01-11 DIAGNOSIS — Z79899 Other long term (current) drug therapy: Secondary | ICD-10-CM | POA: Insufficient documentation

## 2021-01-11 DIAGNOSIS — I2510 Atherosclerotic heart disease of native coronary artery without angina pectoris: Secondary | ICD-10-CM | POA: Diagnosis not present

## 2021-01-11 DIAGNOSIS — R7989 Other specified abnormal findings of blood chemistry: Secondary | ICD-10-CM | POA: Diagnosis present

## 2021-01-11 DIAGNOSIS — R778 Other specified abnormalities of plasma proteins: Secondary | ICD-10-CM | POA: Diagnosis present

## 2021-01-11 DIAGNOSIS — I214 Non-ST elevation (NSTEMI) myocardial infarction: Secondary | ICD-10-CM | POA: Diagnosis not present

## 2021-01-11 DIAGNOSIS — R079 Chest pain, unspecified: Secondary | ICD-10-CM | POA: Diagnosis present

## 2021-01-11 DIAGNOSIS — I129 Hypertensive chronic kidney disease with stage 1 through stage 4 chronic kidney disease, or unspecified chronic kidney disease: Secondary | ICD-10-CM | POA: Diagnosis not present

## 2021-01-11 LAB — PROTIME-INR
INR: 1.1 (ref 0.8–1.2)
Prothrombin Time: 13.7 seconds (ref 11.4–15.2)

## 2021-01-11 LAB — CBC
HCT: 47.4 % (ref 39.0–52.0)
Hemoglobin: 16 g/dL (ref 13.0–17.0)
MCH: 33 pg (ref 26.0–34.0)
MCHC: 33.8 g/dL (ref 30.0–36.0)
MCV: 97.7 fL (ref 80.0–100.0)
Platelets: 273 10*3/uL (ref 150–400)
RBC: 4.85 MIL/uL (ref 4.22–5.81)
RDW: 13.7 % (ref 11.5–15.5)
WBC: 9 10*3/uL (ref 4.0–10.5)
nRBC: 0 % (ref 0.0–0.2)

## 2021-01-11 LAB — TROPONIN I (HIGH SENSITIVITY)
Troponin I (High Sensitivity): 1574 ng/L (ref <18)
Troponin I (High Sensitivity): 1446 ng/L (ref <18)

## 2021-01-11 LAB — ECHOCARDIOGRAM COMPLETE
AR max vel: 3 cm2
AV Area VTI: 3.48 cm2
AV Area mean vel: 2.99 cm2
AV Mean grad: 1 mmHg
AV Peak grad: 1.5 mmHg
Ao pk vel: 0.6 m/s
Area-P 1/2: 6.71 cm2
Height: 71 in
S' Lateral: 2.63 cm
Weight: 2320.02 oz

## 2021-01-11 LAB — RESP PANEL BY RT-PCR (FLU A&B, COVID) ARPGX2
Influenza A by PCR: NEGATIVE
Influenza B by PCR: NEGATIVE
SARS Coronavirus 2 by RT PCR: NEGATIVE

## 2021-01-11 LAB — HEPARIN LEVEL (UNFRACTIONATED): Heparin Unfractionated: 0.25 IU/mL — ABNORMAL LOW (ref 0.30–0.70)

## 2021-01-11 LAB — HEMOGLOBIN A1C
Hgb A1c MFr Bld: 5.2 % (ref 4.8–5.6)
Mean Plasma Glucose: 102.54 mg/dL

## 2021-01-11 LAB — BASIC METABOLIC PANEL
Anion gap: 13 (ref 5–15)
BUN: 33 mg/dL — ABNORMAL HIGH (ref 8–23)
CO2: 26 mmol/L (ref 22–32)
Calcium: 9.9 mg/dL (ref 8.9–10.3)
Chloride: 96 mmol/L — ABNORMAL LOW (ref 98–111)
Creatinine, Ser: 1.62 mg/dL — ABNORMAL HIGH (ref 0.61–1.24)
GFR, Estimated: 44 mL/min — ABNORMAL LOW (ref >60)
Glucose, Bld: 117 mg/dL — ABNORMAL HIGH (ref 70–99)
Potassium: 3.7 mmol/L (ref 3.5–5.1)
Sodium: 135 mmol/L (ref 135–145)

## 2021-01-11 LAB — BRAIN NATRIURETIC PEPTIDE: B Natriuretic Peptide: 720.9 pg/mL — ABNORMAL HIGH (ref 0.0–100.0)

## 2021-01-11 LAB — APTT: aPTT: 28 seconds (ref 24–36)

## 2021-01-11 MED ORDER — ONDANSETRON HCL 4 MG PO TABS: 4.0000 mg | ORAL_TABLET | Freq: Four times a day (QID) | ORAL | Status: DC | PRN

## 2021-01-11 MED ORDER — SODIUM CHLORIDE 0.9% FLUSH
3.0000 mL | Freq: Two times a day (BID) | INTRAVENOUS | Status: DC
  Administered 2021-01-11: 3 mL via INTRAVENOUS

## 2021-01-11 MED ORDER — ACETAMINOPHEN 650 MG RE SUPP: 325.0000 mg | Freq: Four times a day (QID) | RECTAL | Status: DC | PRN

## 2021-01-11 MED ORDER — ALBUTEROL SULFATE HFA 108 (90 BASE) MCG/ACT IN AERS
2.0000 | INHALATION_SPRAY | RESPIRATORY_TRACT | Status: DC | PRN
  Filled 2021-01-11: qty 6.7

## 2021-01-11 MED ORDER — ASPIRIN 81 MG PO CHEW
324.0000 mg | CHEWABLE_TABLET | Freq: Once | ORAL | Status: AC
  Administered 2021-01-11: 324 mg via ORAL

## 2021-01-11 MED ORDER — FENTANYL CITRATE (PF) 100 MCG/2ML IJ SOLN: 50.0000 ug | Freq: Once | INTRAMUSCULAR | Status: DC

## 2021-01-11 MED ORDER — ATENOLOL 25 MG PO TABS: 100.0000 mg | ORAL_TABLET | Freq: Every day | ORAL | Status: DC

## 2021-01-11 MED ORDER — HEPARIN BOLUS VIA INFUSION
4000.0000 [IU] | Freq: Once | INTRAVENOUS | Status: AC
  Administered 2021-01-11: 4000 [IU] via INTRAVENOUS
  Filled 2021-01-11: qty 4000

## 2021-01-11 MED ORDER — ONDANSETRON HCL 4 MG/2ML IJ SOLN
4.0000 mg | Freq: Four times a day (QID) | INTRAMUSCULAR | Status: DC | PRN
  Administered 2021-01-11: 4 mg via INTRAVENOUS
  Filled 2021-01-11: qty 2

## 2021-01-11 MED ORDER — BUDESONIDE 0.5 MG/2ML IN SUSP: 1.0000 mg | Freq: Every day | RESPIRATORY_TRACT | Status: DC

## 2021-01-11 MED ORDER — MORPHINE SULFATE (PF) 2 MG/ML IV SOLN
2.0000 mg | INTRAVENOUS | Status: DC | PRN
  Administered 2021-01-11 (×2): 2 mg via INTRAVENOUS
  Filled 2021-01-11 (×2): qty 1

## 2021-01-11 MED ORDER — NITROGLYCERIN 0.4 MG SL SUBL
0.4000 mg | SUBLINGUAL_TABLET | SUBLINGUAL | Status: DC | PRN
  Administered 2021-01-12 (×2): 0.4 mg via SUBLINGUAL
  Filled 2021-01-11: qty 1

## 2021-01-11 MED ORDER — MORPHINE SULFATE (PF) 4 MG/ML IV SOLN
4.0000 mg | INTRAVENOUS | Status: DC | PRN
  Administered 2021-01-11 – 2021-01-12 (×2): 4 mg via INTRAVENOUS
  Filled 2021-01-11 (×2): qty 1

## 2021-01-11 MED ORDER — ASPIRIN 81 MG PO CHEW
CHEWABLE_TABLET | ORAL | Status: AC
  Filled 2021-01-11: qty 4

## 2021-01-11 MED ORDER — ACETAMINOPHEN 325 MG PO TABS: 325.0000 mg | ORAL_TABLET | Freq: Four times a day (QID) | ORAL | Status: DC | PRN

## 2021-01-11 MED ORDER — ATENOLOL 100 MG PO TABS: 100.0000 mg | ORAL_TABLET | Freq: Every day | ORAL | Status: DC

## 2021-01-11 MED ORDER — IRBESARTAN 150 MG PO TABS
300.0000 mg | ORAL_TABLET | Freq: Every day | ORAL | Status: DC
  Filled 2021-01-11: qty 2

## 2021-01-11 MED ORDER — ASPIRIN EC 81 MG PO TBEC: 81.0000 mg | DELAYED_RELEASE_TABLET | Freq: Every day | ORAL | Status: DC

## 2021-01-11 MED ORDER — LACTATED RINGERS IV SOLN: INTRAVENOUS | Status: AC

## 2021-01-11 MED ORDER — METOPROLOL TARTRATE 5 MG/5ML IV SOLN
5.0000 mg | INTRAVENOUS | Status: DC | PRN
  Administered 2021-01-11: 5 mg via INTRAVENOUS
  Filled 2021-01-11: qty 5

## 2021-01-11 MED ORDER — HEPARIN (PORCINE) 25000 UT/250ML-% IV SOLN
900.0000 [IU]/h | INTRAVENOUS | Status: DC
  Administered 2021-01-11: 750 [IU]/h via INTRAVENOUS
  Filled 2021-01-11: qty 250

## 2021-01-11 MED ORDER — HEPARIN BOLUS VIA INFUSION
1000.0000 [IU] | Freq: Once | INTRAVENOUS | Status: AC
  Administered 2021-01-11: 1000 [IU] via INTRAVENOUS
  Filled 2021-01-11: qty 1000

## 2021-01-11 NOTE — ED Notes (Signed)
Date and time results received: 01/11/21 12:15  Test: Troponin  Critical Value: 1574  Name of Provider Notified: MD Tamala Julian

## 2021-01-11 NOTE — ED Notes (Signed)
Report given to Russell RN.

## 2021-01-11 NOTE — ED Triage Notes (Addendum)
Pt here for CP that started  Last Thursday. Pt seen at [facility] office today and told to come straight here. Pads placed on pt. Pt placed on monitor. Cart at bedside. Pt dressed into gown and all other clothes removed.  Pt states pain across chest and radiation to both arms. Pt on blood thinners.   Per RN [employer] MD [facility] said get blood and covid swab and he will come and get him and take to cath lab.

## 2021-01-11 NOTE — ED Notes (Signed)
MD Tamala Julian at bedside

## 2021-01-11 NOTE — ED Notes (Signed)
Pharmacy tech at bedside 

## 2021-01-11 NOTE — ED Notes (Signed)
Pt with report of chest discomfort, given 4mg  morphine. Pt then transported upstairs. When on unit, pt reports his chest pain has not improved, does report some tightness and feeling of anxiety. Additional ordered morphine given, metoprolol given per PRN order and slightly elevated BP. Receiving RN at bedside, update given

## 2021-01-11 NOTE — ED Notes (Addendum)
MD Cox at bedside 

## 2021-01-11 NOTE — ED Notes (Signed)
Echo at bedside

## 2021-01-11 NOTE — ED Provider Notes (Signed)
[facility] Emergency Department Provider Note  ____________________________________________   Event Date/Time   First MD Initiated Contact with Patient 01/11/21 1050     (approximate)  I have reviewed the triage vital signs and the nursing notes.   HISTORY  Chief Complaint Chest Pain   HPI Carl Townsend is a 75 y.o. male with a past medical history of HTN, HDL, CKD, arthritis and ED with no significant known coronary artery disease who presents for assessment of some substernal chest pain after being referred to the ED from cardiology clinic.  Patient states he has been having on and off substernal chest tightness and pressure since 1/6 but it got worse today.  He denies any cough, shortness of breath, headache, earache, sore throat, fevers, Donnell pain, back pain, urinary symptoms, rash or acute extremity pain.  He denies any recent traumatic injuries.  Denies any tobacco abuse or regular EtOH use.  No prior similar episodes.  No clear leaving aggravating factors including positional or exertional as patient has pain at rest.         Past Medical History:  Diagnosis Date  . Arthritis    neck and shoulders  . Chronic kidney disease    chronic kidney disease - stage 3, GFR 30-59 ml/min  . Colon cancer ([place])   . ED (erectile dysfunction)   . History of 2019 novel coronavirus disease (COVID-19) 08/23/2020  . Hyperlipidemia   . Hypertension   . Inguinal hernia   . Pre-diabetes     Patient Active Problem List   Diagnosis Date Noted  . Chest pain at rest 01/11/2021  . Essential hypertension 01/11/2021  . Elevated troponin 01/11/2021  . COVID-19 virus infection 08/24/2020    Past Surgical History:  Procedure Laterality Date  . COLON SURGERY Right 05/06   colectomy and sigmoid colectomy   . COLONOSCOPY WITH PROPOFOL N/A 03/13/2016   Procedure: COLONOSCOPY WITH PROPOFOL;  Surgeon: Lollie Sails, MD;  Location: [facility] ENDOSCOPY;  Service:  Endoscopy;  Laterality: N/A;  . COLONOSCOPY WITH PROPOFOL N/A 03/09/2020   Procedure: COLONOSCOPY WITH PROPOFOL;  Surgeon: Toledo, Benay Pike, MD;  Location: [facility] ENDOSCOPY;  Service: Gastroenterology;  Laterality: N/A;  . EYE SURGERY Left    vitreous retinal surgery  . right foot surgery    . XI ROBOTIC ASSISTED INGUINAL HERNIA REPAIR WITH MESH Bilateral 11/10/2020   Procedure: XI ROBOTIC ASSISTED INGUINAL HERNIA REPAIR WITH MESH;  Surgeon: Benjamine Sprague, DO;  Location: [facility] ORS;  Service: General;  Laterality: Bilateral;    Prior to Admission medications   Medication Sig Start Date End Date Taking? Authorizing Provider  albuterol (VENTOLIN HFA) 108 (90 Base) MCG/ACT inhaler Inhale 2 puffs into the lungs every 4 (four) hours as needed for wheezing or shortness of breath. 01/08/21  Yes Cuthriell, Charline Bills, PA-C  ascorbic acid (VITAMIN C) 500 MG tablet Take 500 mg by mouth daily.   Yes [provider]  aspirin 81 MG tablet Take 81 mg by mouth daily.   Yes [provider]  atenolol (TENORMIN) 100 MG tablet Take 100 mg by mouth at bedtime.   Yes [provider]  candesartan (ATACAND) 32 MG tablet Take 32 mg by mouth daily. 12/16/20  Yes [provider]  cyanocobalamin 1000 MCG tablet Take 1,000 mcg by mouth daily.   Yes [provider]  ergocalciferol (VITAMIN D2) 1.25 MG (50000 UT) capsule Take 50,000 Units by mouth once a week.   Yes [provider]  Glycerin-Polysorbate 80 (REFRESH DRY EYE THERAPY OP) Place 1 drop into both eyes daily as needed (dry eye).   Yes [provider]  Multiple Vitamin (MULTIVITAMIN) tablet Take 1 tablet by mouth daily.   Yes [provider]  nitroGLYCERIN (NITROSTAT) 0.3 MG SL tablet Place 0.3 mg under the tongue as directed. 01/10/21 01/10/22 Yes [provider]  predniSONE (DELTASONE) 50 MG tablet Take 1 tablet (50 mg total) by mouth daily with breakfast. 01/08/21  Yes Cuthriell, Charline Bills,  PA-C  sildenafil (VIAGRA) 100 MG tablet Take 100 mg by mouth daily as needed for erectile dysfunction.   Yes [provider]  zinc gluconate 50 MG tablet Take 50 mg by mouth daily.   Yes [provider]  budesonide (PULMICORT) 1 MG/2ML nebulizer solution Inhale 2 mLs into the lungs daily. 01/10/21 01/10/22  [provider]  Carboxymethylcellulose Sodium (REFRESH LIQUIGEL) 1 % GEL Place 1 drop into both eyes at bedtime.     [provider]    Allergies Patient has no known allergies.  No family history on file.  Social History Social History   Tobacco Use  . Smoking status: Former Smoker    Quit date: 2000    Years since quitting: 22.0  . Smokeless tobacco: Never Used  Vaping Use  . Vaping Use: Never used  Substance Use Topics  . Alcohol use: No  . Drug use: No    Review of Systems  Review of Systems  Constitutional: Negative for chills and fever.  HENT: Negative for sore throat.   Eyes: Negative for pain.  Respiratory: Negative for cough and stridor.   Cardiovascular: Positive for chest pain.  Gastrointestinal: Negative for vomiting.  Genitourinary: Negative for dysuria.  Musculoskeletal: Negative for myalgias.  Skin: Negative for rash.  Neurological: Negative for seizures, loss of consciousness and headaches.  Psychiatric/Behavioral: Negative for suicidal ideas.  All other systems reviewed and are negative.     ____________________________________________   PHYSICAL EXAM:  VITAL SIGNS: ED Triage Vitals  Enc Vitals Group     BP --      Pulse --      Resp --      Temp --      Temp src --      SpO2 --      Weight 01/11/21 1049 145 lb (65.8 kg)     Height 01/11/21 1049 5\' 11"  (1.803 m)     Head Circumference --      Peak Flow --      Pain Score 01/11/21 1047 7     Pain Loc --      Pain Edu? --      Excl. in [place]? --    Vitals:   01/11/21 1500 01/11/21 1530  BP: (!) 143/73 136/78  Pulse: 64 (!) 58  Resp: 16 16  Temp:     SpO2: 97% 98%   Physical Exam Vitals and nursing note reviewed.  Constitutional:      Appearance: He is well-developed and well-nourished.  HENT:     Head: Normocephalic and atraumatic.     Right Ear: External ear normal.     Left Ear: External ear normal.     Nose: Nose normal.     Mouth/Throat:     Mouth: Mucous membranes are moist.  Eyes:     Conjunctiva/sclera: Conjunctivae normal.  Cardiovascular:     Rate and Rhythm: Normal rate and regular rhythm.     Heart sounds: No murmur heard.  Pulmonary:     Effort: Pulmonary effort is normal. No respiratory distress.     Breath sounds: Normal breath sounds.  Abdominal:     Palpations: Abdomen is soft.     Tenderness: There is no abdominal tenderness.  Musculoskeletal:        General: No edema.     Cervical back: Neck supple.  Skin:    General: Skin is warm and dry.     Capillary Refill: Capillary refill takes less than 2 seconds.  Neurological:     Mental Status: He is alert and oriented to person, place, and time.  Psychiatric:        Mood and Affect: Mood and affect and mood normal.      ____________________________________________   LABS (all labs ordered are listed, but only abnormal results are displayed)  Labs Reviewed  BASIC METABOLIC PANEL - Abnormal; Notable for the following components:      Result Value   Chloride 96 (*)    Glucose, Bld 117 (*)    BUN 33 (*)    Creatinine, Ser 1.62 (*)    GFR, Estimated 44 (*)    All other components within normal limits  BRAIN NATRIURETIC PEPTIDE - Abnormal; Notable for the following components:   B Natriuretic Peptide 720.9 (*)    All other components within normal limits  TROPONIN I (HIGH SENSITIVITY) - Abnormal; Notable for the following components:   Troponin I (High Sensitivity) 1,574 (*)    All other components within normal limits  TROPONIN I (HIGH SENSITIVITY) - Abnormal; Notable for the following components:   Troponin I (High Sensitivity) 1,446 (*)     All other components within normal limits  RESP PANEL BY RT-PCR (FLU A&B, COVID) ARPGX2  CBC  APTT  PROTIME-INR  HEMOGLOBIN A1C  HEPARIN LEVEL (UNFRACTIONATED)   ____________________________________________  EKG  Sinus rhythm with ventricular rate of 75, ST depression suggestive of ischemia in inferior leads and lateral leads with some nonspecific elevation in V2. ____________________________________________  RADIOLOGY  ED MD interpretation: No pneumothorax, effusion, edema, or other acute intrathoracic process.  Mediastinum is not particular wide.  Official radiology report(s): DG Chest 1 View  Result Date: 01/11/2021 CLINICAL DATA:  Chest pain EXAM: CHEST  1 VIEW COMPARISON:  01/08/2021 FINDINGS: The heart size and mediastinal contours are within normal limits. Slightly hyperinflated lungs. No focal airspace consolidation, pleural effusion, or pneumothorax. The visualized skeletal structures are unremarkable. IMPRESSION: No active disease. Electronically Signed   By: Davina Poke D.O.   On: 01/11/2021 11:20    ____________________________________________   PROCEDURES  Procedure(s) performed (including Critical Care):  .Critical Care Performed by: Lucrezia Starch, MD Authorized by: Lucrezia Starch, MD   Critical care provider statement:    Critical care time (minutes):  45   Critical care time was exclusive of:  Separately billable procedures and treating other patients   Critical care was necessary to treat or prevent imminent or life-threatening deterioration of the following conditions:  Cardiac failure   Critical care was time spent personally by me on the following activities:  Discussions with consultants, evaluation of patient's response to treatment, examination of patient, ordering and performing treatments and interventions, ordering and review of laboratory studies, ordering and review of radiographic studies, pulse oximetry, re-evaluation of patient's  condition, obtaining history from patient or surrogate and review of old charts     ____________________________________________   INITIAL IMPRESSION / [place] / ED COURSE      Patient  presents with above history and exam for further evaluation management of chest pain from cardiology clinic with ECG concerning for acute ischemic changes.  These changes appear new when compared to prior.  On arrival patient is afebrile and hemodynamically stable  History exam is not consistent with any acute traumatic injury or acute infectious process.  No evidence of pneumothorax, heart failure, pneumonia or other acute intrathoracic process on chest x-ray.  BMP shows creatinine of 1.62 compared to 1.5 to 3 days ago with no other significant electrolyte or metabolic derangements.  CBC is unremarkable.  Initial troponin is 1574.  Given concern for NSTEMI I did discuss patient with on-call cardiologist Dr. Clayborn Bigness with who stated he would plan to take the patient to Cath Lab this afternoon.  Patient given ASA and started on heparin drip    ___________________________________________   FINAL CLINICAL IMPRESSION(S) / ED DIAGNOSES  Final diagnoses:  NSTEMI (non-ST elevated myocardial infarction) (HCC)    Medications  heparin ADULT infusion 100 units/mL (25000 units/250mL) (750 Units/hr Intravenous New Bag/Given 01/11/21 1132)  fentaNYL (SUBLIMAZE) injection 50 mcg (0 mcg Intravenous Hold 01/11/21 1352)  sodium chloride flush (NS) 0.9 % injection 3 mL (3 mLs Intravenous Given 01/11/21 1352)  aspirin EC tablet 81 mg (81 mg Oral Not Given 01/11/21 1422)  albuterol (VENTOLIN HFA) 108 (90 Base) MCG/ACT inhaler 2 puff (has no administration in time range)  budesonide (PULMICORT) nebulizer solution 1 mg (has no administration in time range)  acetaminophen (TYLENOL) tablet 325 mg (has no administration in time range)    Or  acetaminophen (TYLENOL) suppository 325 mg (has no administration in time  range)  ondansetron (ZOFRAN) tablet 4 mg (has no administration in time range)    Or  ondansetron (ZOFRAN) injection 4 mg (has no administration in time range)  morphine 2 MG/ML injection 2 mg (has no administration in time range)  morphine 4 MG/ML injection 4 mg (has no administration in time range)  atenolol (TENORMIN) tablet 100 mg (has no administration in time range)  metoprolol tartrate (LOPRESSOR) injection 5 mg (has no administration in time range)  lactated ringers infusion ( Intravenous New Bag/Given 01/11/21 1500)  aspirin chewable tablet 324 mg (324 mg Oral Not Given 01/11/21 1236)  heparin bolus via infusion 4,000 Units (4,000 Units Intravenous Bolus from Bag 01/11/21 1133)     ED Discharge Orders    None       Note:  This document was prepared using Dragon voice recognition software and may include unintentional dictation errors.   Lucrezia Starch, MD 01/11/21 1600

## 2021-01-11 NOTE — Consult Note (Signed)
ANTICOAGULATION CONSULT NOTE  Pharmacy Consult for Heparin Infusion Indication: chest pain/ACS  Patient Measurements: Heparin Dosing Weight: 65.8 kg  Labs: Recent Labs    01/11/21 1051 01/11/21 1255 01/11/21 1930  HGB 16.0  --   --   HCT 47.4  --   --   PLT 273  --   --   APTT 28  --   --   LABPROT 13.7  --   --   INR 1.1  --   --   HEPARINUNFRC  --   --  0.25*  CREATININE 1.62*  --   --   TROPONINIHS 1,574* 1,446*  --     Estimated Creatinine Clearance: 37.2 mL/min (A) (by C-G formula based on SCr of 1.62 mg/dL (H)).   Medical History: Past Medical History:  Diagnosis Date  . Arthritis    neck and shoulders  . Chronic kidney disease    chronic kidney disease - stage 3, GFR 30-59 ml/min  . Colon cancer ([place])   . ED (erectile dysfunction)   . History of 2019 novel coronavirus disease (COVID-19) 08/23/2020  . Hyperlipidemia   . Hypertension   . Inguinal hernia   . Pre-diabetes     Medications:  No anticoagulation prior to admission per my chart review  Assessment: Patient is a 75 y/o M with medical history as above who presented to the ED 1/12 with chest pain. Patient seen in ED 1/9 for same complaint. Patient was seen in cardiology office prior to presentation and instructed to come to the ED. Pharmacy consulted to initiate heparin infusion for suspected ACS.  Baseline CBC pending. Was normal when checked 1/9 ED visit. Baseline aPTT and PT-INR pending.   1/12 1130 INITIAL: 4000 unit IV bolus x 1 followed by continuous infusion at 750 units/hr 1/12 1930 HL = 0.25   Goal of Therapy:  Heparin level 0.3-0.7 units/ml Monitor platelets by anticoagulation protocol: Yes   Plan:  --HL slightly subtherapeutic. Will give heparin 1000 unit bolus x 1 and increase infusion rate to 900 units/hr --Will check HL 8 hours following rate change --Daily CBC per protocol while on heparin infusion  Dorothe Pea, PharmD, BCPS Clinical Pharmacist  01/11/2021,8:12 PM

## 2021-01-11 NOTE — Consult Note (Signed)
CARDIOLOGY CONSULT NOTE               Patient ID: Carl Townsend MRN: [id number] DOB/AGE: 1946-04-10 75 y.o.  Admit date: 01/11/2021 Referring Physician Dr. Rupert Stacks hospitalist Primary Physician Dr. Roque Lias primary Primary Cardiologist [facility] Reason for Consultation non-STEMI unstable angina  HPI: Patient is a 75 year old black male previous smoker renal insufficiency hypertension reported been having recurrent chest pain symptoms was actually seen in the emergency room earlier this week after evaluation with negative troponins patient was discharged home and advised to follow-up with cardiology he is continue to have recurrent chest pain symptoms at home 10 out of 10 at times severe lasting 20 to 30 minutes he finally came to the office to be seen and had significant chest pain prior that day after evaluation in cardiology office he was trying reported directly to the emergency room for admission and probable non-STEMI with the plan for cardiac cath within 24 hours patient was advised to start intravenous anticoagulation continue beta-blockers and ARB as well as nitrates.  In the emergency room the patient is currently pain-free but unfortunately developed elevated troponin of 1500 suggestive of a non-STEMI at some point prior to presentation.  EKG still has diffuse ST segment depression and LVH . Review of systems complete and found to be negative unless listed above     Past Medical History:  Diagnosis Date  . Arthritis    neck and shoulders  . Chronic kidney disease    chronic kidney disease - stage 3, GFR 30-59 ml/min  . Colon cancer ([place])   . ED (erectile dysfunction)   . History of 2019 novel coronavirus disease (COVID-19) 08/23/2020  . Hyperlipidemia   . Hypertension   . Inguinal hernia   . Pre-diabetes     Past Surgical History:  Procedure Laterality Date  . COLON SURGERY Right 05/06   colectomy and sigmoid colectomy   . COLONOSCOPY WITH PROPOFOL N/A  03/13/2016   Procedure: COLONOSCOPY WITH PROPOFOL;  Surgeon: Lollie Sails, MD;  Location: [facility] ENDOSCOPY;  Service: Endoscopy;  Laterality: N/A;  . COLONOSCOPY WITH PROPOFOL N/A 03/09/2020   Procedure: COLONOSCOPY WITH PROPOFOL;  Surgeon: Toledo, Benay Pike, MD;  Location: [facility] ENDOSCOPY;  Service: Gastroenterology;  Laterality: N/A;  . EYE SURGERY Left    vitreous retinal surgery  . right foot surgery    . XI ROBOTIC ASSISTED INGUINAL HERNIA REPAIR WITH MESH Bilateral 11/10/2020   Procedure: XI ROBOTIC ASSISTED INGUINAL HERNIA REPAIR WITH MESH;  Surgeon: Benjamine Sprague, DO;  Location: [facility] ORS;  Service: General;  Laterality: Bilateral;    (Not in a hospital admission)  Social History   Socioeconomic History  . Marital status: Married    Spouse name: Not on file  . Number of children: Not on file  . Years of education: Not on file  . Highest education level: Not on file  Occupational History  . Not on file  Tobacco Use  . Smoking status: Former Smoker    Quit date: 2000    Years since quitting: 22.0  . Smokeless tobacco: Never Used  Vaping Use  . Vaping Use: Never used  Substance and Sexual Activity  . Alcohol use: No  . Drug use: No  . Sexual activity: Not on file  Other Topics Concern  . Not on file  Social History Narrative  . Not on file   Social Determinants of Health   Financial Resource Strain: Not on file  Food Insecurity: Not on file  Transportation Needs: Not on file  Physical Activity: Not on file  Stress: Not on file  Social Connections: Not on file  Intimate Partner Violence: Not on file    No family history on file.    Review of systems complete and found to be negative unless listed above      PHYSICAL EXAM  General: Well developed, well nourished, in no acute distress HEENT:  Normocephalic and atramatic Neck:  No JVD.  Lungs: Clear bilaterally to auscultation and percussion. Heart: HRRR . Normal S1 and S2 without gallops or murmurs.   Abdomen: Bowel sounds are positive, abdomen soft and non-tender  Msk:  Back normal, normal gait. Normal strength and tone for age. Extremities: No clubbing, cyanosis or edema.   Neuro: Alert and oriented X 3. Psych:  Good affect, responds appropriately  Labs:   Lab Results  Component Value Date   WBC 9.0 01/11/2021   HGB 16.0 01/11/2021   HCT 47.4 01/11/2021   MCV 97.7 01/11/2021   PLT 273 01/11/2021    Recent Labs  Lab 01/11/21 1051  NA 135  K 3.7  CL 96*  CO2 26  BUN 33*  CREATININE 1.62*  CALCIUM 9.9  GLUCOSE 117*   No results found for: CKTOTAL, CKMB, CKMBINDEX, TROPONINI No results found for: CHOL No results found for: HDL No results found for: LDLCALC No results found for: TRIG No results found for: CHOLHDL No results found for: LDLDIRECT    Radiology: DG Chest 1 View  Result Date: 01/11/2021 CLINICAL DATA:  Chest pain EXAM: CHEST  1 VIEW COMPARISON:  01/08/2021 FINDINGS: The heart size and mediastinal contours are within normal limits. Slightly hyperinflated lungs. No focal airspace consolidation, pleural effusion, or pneumothorax. The visualized skeletal structures are unremarkable. IMPRESSION: No active disease. Electronically Signed   By: Davina Poke D.O.   On: 01/11/2021 11:20   DG Chest 2 View  Result Date: 01/08/2021 CLINICAL DATA:  Chest pain.  Shortness of breath. EXAM: CHEST - 2 VIEW COMPARISON:  September 14, 2011 FINDINGS: Flattening of the hemidiaphragms the lateral view. The heart, hila, mediastinum, lungs, and pleura are otherwise unremarkable. 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 No acute abnormalities. Electronically Signed   By: Dorise Bullion III M.D   On: 01/08/2021 14:36    EKG: Normal sinus rhythm LVH diffuse ST segment depression  ASSESSMENT AND PLAN:  Non-STEMI Unstable angina Probable coronary disease Abnormal EKG Hypertension History of smoking Hyperlipidemia Chronic renal  insufficiency COPD . Plan Agree with admit to telemetry for non-STEMI Follow-up EKGs and troponins Recommend IV heparin for anticoagulation Echocardiogram for evaluation of left ventricular function wall motion Continue ARB beta-blocker aspirin statin Continue inhalers as necessary Recommend diagnostic cardiac cath within 24 hours  Signed: Yolonda Kida MD 01/11/2021, 5:35 PM

## 2021-01-11 NOTE — Progress Notes (Signed)
*  PRELIMINARY RESULTS* Echocardiogram 2D Echocardiogram has been performed.  Carl Townsend 01/11/2021, 2:46 PM

## 2021-01-11 NOTE — Consult Note (Signed)
ANTICOAGULATION CONSULT NOTE  Pharmacy Consult for Heparin Infusion Indication: chest pain/ACS  Patient Measurements: Heparin Dosing Weight: 65.8 kg  Labs: Recent Labs    01/08/21 1404 01/08/21 1604  HGB 15.2  --   HCT 45.1  --   PLT 249  --   CREATININE 1.52*  --   TROPONINIHS 14 35*    Estimated Creatinine Clearance: 39.7 mL/min (A) (by C-G formula based on SCr of 1.52 mg/dL (H)).   Medical History: Past Medical History:  Diagnosis Date  . Arthritis    neck and shoulders  . Chronic kidney disease    chronic kidney disease - stage 3, GFR 30-59 ml/min  . Colon cancer ([place])   . ED (erectile dysfunction)   . History of 2019 novel coronavirus disease (COVID-19) 08/23/2020  . Hyperlipidemia   . Hypertension   . Inguinal hernia   . Pre-diabetes     Medications:  No anticoagulation prior to admission per my chart review  Assessment: Patient is a 75 y/o M with medical history as above who presented to the ED 1/12 with chest pain. Patient seen in ED 1/9 for same complaint. Patient was seen in cardiology office prior to presentation and instructed to come to the ED. Pharmacy consulted to initiate heparin infusion for suspected ACS.  Baseline CBC pending. Was normal when checked 1/9 ED visit. Baseline aPTT and PT-INR pending.   Goal of Therapy:  Heparin level 0.3-0.7 units/ml Monitor platelets by anticoagulation protocol: Yes   Plan:  --Heparin 4000 unit IV bolus x 1 followed by continuous infusion at 750 units/hr --Will check HL 8 hours after initiation of infusion --Daily CBC per protocol while on heparin infusion  Carl Townsend 01/11/2021,11:05 AM

## 2021-01-11 NOTE — H&P (Signed)
History and Physical   Carl Townsend [id number] DOB: Jan 18, 1946 DOA: 01/11/2021  PCP: [name], MD  Outpatient Specialists: Dr. Lujean Amel  Patient coming from: [facility]  I have personally briefly reviewed patient's old medical records in [place].  Chief Concern: chest pain  HPI: Carl Townsend is a 75 y.o. male with medical history significant for hypertension presented to ED from his cardiologist office for chief concern of chest pain.   Carl Townsend reports the chest pain that started at rest, like a brick sitting on his chest, 10/10, lasting 20-30 minutes. The pain started 01/04/21 and he states he has never felt this way before. He reports the pain continued to progressed to it's peak on Thursday, 01/05/21. The pain was worst with laying down at night and patient tried to walk it off and it didn't help. He has been sleeping in his reclinder. He took asa at home and it didn't help. He denies jaw discomfort. He endorsed neck spasm that started on Wednesday along with the chest pain. He endorsed feeling nauseous and denies vomiting.   He endorsed shortness of breath associated with the chest pain, numbness in his arms, and ache in his shoulders.  He reports he was seen by the [facility] last week and was told his SBP was in the 200s. Patient reports that he went to his PCP's office and was seen by Ms. Fields on 01/10/21 and was prescribed nitroglycerin and steroids for possible COPD. He reports the nitroglycerin improved his pain. He was seen by his cardiologist, Dr. Clayborn Bigness on 01/11/21 and was advised to present to the emergency department for further evaluation.   At bedside, he states his pain has come down to a 2/10 now.   Family history is positive for heart disease in all his brothers. He endorses heart etiology as cause of death in one brother in his 49s. Patient states his younger brother was diagnosed with heart disease and had stent placement in the  last two years.   Social history: he lives with his spouse and is retired. He formerly smoked tobacco (quit 20 years ago), endorses infrequent consumption of etoh. He denies recreational drug use.   ROS: Constitutional: no weight change, no fever ENT/Mouth: no sore throat, no rhinorrhea Eyes: no eye pain, no vision changes Cardiovascular: + chest pain, + dyspnea,  no edema, no palpitations Respiratory: no cough, no sputum, no wheezing Gastrointestinal: + nausea, no vomiting, no diarrhea, no constipation Genitourinary: no urinary incontinence, no dysuria, no hematuria Musculoskeletal: no arthralgias, no myalgias Skin: no skin lesions, no pruritus, Neuro: + weakness, no loss of consciousness, no syncope Psych: no anxiety, no depression, + decrease appetite Heme/Lymph: no bruising, no bleeding  ED Course: Discussed with ED provider. Patient   Assessment/Plan  Principal Problem:   Chest pain at rest Active Problems:   Essential hypertension   Elevated troponin   # Chest pain at rest  # Elevated troponin  # NSTEMI - Patient was sent from Dr. Etta Quill office - ED provider has consulted Dr. Clayborn Bigness with, and he is aware of patient - Elevated troponin high-sensitivity initially at 1574 and second troponin is 1446 - Fentanyl injection 50 mcg IV once was ordered for pain control by ED provider - Morphine 4 mg injection every 4 hours as needed for severe pain, 3 doses ordered - Morphine 2 mg injection every hour as needed for moderate pain, 1 day ordered - Metoprolol tartrate 5 mg IV every 5 minutes.  For chest pain - Status post heparin bolus and initiation of heparin gtt. per ED provider, we will continue - Checking A1c and lipid panel, patient is not on statin therapy and/or Entresto, appreciate cardiology recommendations - N.p.o. at this time in anticipation of cath procedure  - Patient is scheduled for Cath Lab per cardiologist on 01/12/2021  Hypertension-controlled, resumed  home atenolol 100 mg daily and irbesartan 300 mg daily -Metoprolol tartrate 5 mg every 5 minutes for hypertension  CKD 3a - Scr in ED was 1.62 - Baseline Scr is 1.5 and GFR is 56 - LR 125 ml/hr for 8 hours in anticipation of cardiac catheterization   Chart reviewed.   DVT prophylaxis: On heparin GTT Code Status: Full code Diet: N.p.o. Family Communication: No Disposition Plan: Pending clinical course and left heart cath on 01/12/2021 Consults called: Cardiology Admission status: Progressive cardiac observation  Past Medical History:  Diagnosis Date  . Arthritis    neck and shoulders  . Chronic kidney disease    chronic kidney disease - stage 3, GFR 30-59 ml/min  . Colon cancer ([place])   . ED (erectile dysfunction)   . History of 2019 novel coronavirus disease (COVID-19) 08/23/2020  . Hyperlipidemia   . Hypertension   . Inguinal hernia   . Pre-diabetes    Past Surgical History:  Procedure Laterality Date  . COLON SURGERY Right 05/06   colectomy and sigmoid colectomy   . COLONOSCOPY WITH PROPOFOL N/A 03/13/2016   Procedure: COLONOSCOPY WITH PROPOFOL;  Surgeon: Lollie Sails, MD;  Location: [facility] ENDOSCOPY;  Service: Endoscopy;  Laterality: N/A;  . COLONOSCOPY WITH PROPOFOL N/A 03/09/2020   Procedure: COLONOSCOPY WITH PROPOFOL;  Surgeon: Toledo, Benay Pike, MD;  Location: [facility] ENDOSCOPY;  Service: Gastroenterology;  Laterality: N/A;  . EYE SURGERY Left    vitreous retinal surgery  . right foot surgery    . XI ROBOTIC ASSISTED INGUINAL HERNIA REPAIR WITH MESH Bilateral 11/10/2020   Procedure: XI ROBOTIC ASSISTED INGUINAL HERNIA REPAIR WITH MESH;  Surgeon: Benjamine Sprague, DO;  Location: [facility] ORS;  Service: General;  Laterality: Bilateral;   Social History:  reports that he quit smoking about 22 years ago. He has never used smokeless tobacco. He reports that he does not drink alcohol and does not use drugs.  No Known Allergies No family history on file. Family history: Family  history reviewed and not pertinent  Prior to Admission medications   Medication Sig Start Date End Date Taking? Authorizing Provider  budesonide (PULMICORT) 1 MG/2ML nebulizer solution Inhale 2 mLs into the lungs daily. 01/10/21 01/10/22 Yes [provider]  nitroGLYCERIN (NITROSTAT) 0.3 MG SL tablet Place 0.3 mg under the tongue as directed. 01/10/21 01/10/22 Yes [provider]  albuterol (VENTOLIN HFA) 108 (90 Base) MCG/ACT inhaler Inhale 2 puffs into the lungs every 4 (four) hours as needed for wheezing or shortness of breath. 01/08/21   Cuthriell, Charline Bills, PA-C  ascorbic acid (VITAMIN C) 500 MG tablet Take 500 mg by mouth daily.    [provider]  aspirin 81 MG tablet Take 81 mg by mouth daily.    [provider]  atenolol (TENORMIN) 100 MG tablet Take 100 mg by mouth at bedtime. Take blood pressure before taking in the morning or evening    [provider]  candesartan (ATACAND) 32 MG tablet Take 32 mg by mouth daily. 12/16/20   [provider]  Carboxymethylcellulose Sodium (REFRESH LIQUIGEL) 1 % GEL Place 1 drop into both eyes at bedtime.  [provider]  cyanocobalamin 1000 MCG tablet Take 1,000 mcg by mouth daily.    [provider]  ergocalciferol (VITAMIN D2) 1.25 MG (50000 UT) capsule Take 50,000 Units by mouth once a week. Saturday or Sunday    [provider]  Glycerin-Polysorbate 80 (REFRESH DRY EYE THERAPY OP) Place 1 drop into both eyes daily as needed (Dry eye).    [provider]  Multiple Vitamin (MULTIVITAMIN) tablet Take 1 tablet by mouth daily.    [provider]  predniSONE (DELTASONE) 50 MG tablet Take 1 tablet (50 mg total) by mouth daily with breakfast. 01/08/21   Cuthriell, Charline Bills, PA-C  sildenafil (VIAGRA) 100 MG tablet Take 100 mg by mouth daily as needed for erectile dysfunction.    [provider]  zinc gluconate 50 MG tablet Take 50 mg by mouth daily.     [provider]   Physical Exam: Vitals:   01/11/21 1125 01/11/21 1130 01/11/21 1200 01/11/21 1230  BP:  (!) 163/85 129/81 (!) 143/74  Pulse:  60 (!) 51 (!) 50  Resp:  (!) 28 (!) 22 18  Temp: 97.8 F (36.6 C)     SpO2:  99% 99% 99%  Weight:      Height:       Constitutional: appears age-appropriate, NAD, calm, comfortable Eyes: PERRL, lids and conjunctivae normal ENMT: Mucous membranes are moist. Posterior pharynx clear of any exudate or lesions. Age-appropriate dentition. Hearing appropriate Neck: normal, supple, no masses, no thyromegaly Respiratory: clear to auscultation bilaterally, no wheezing, no crackles. Normal respiratory effort. No accessory muscle use.  Cardiovascular: Regular rate and rhythm, no murmurs / rubs / gallops. No extremity edema. 2+ pedal pulses. No carotid bruits.  Abdomen: no tenderness, no masses palpated, no hepatosplenomegaly. Bowel sounds positive.  Musculoskeletal: no clubbing / cyanosis. No joint deformity upper and lower extremities. Good ROM, no contractures, no atrophy. Normal muscle tone.  Skin: no rashes, lesions, ulcers. No induration Neurologic: Sensation intact. Strength 5/5 in all 4.  Psychiatric: Normal judgment and insight. Alert and oriented x 3. Normal mood.   EKG: independently reviewed, showing sinus rhythm with rate of 75, QTc 444, ST depression in leads II, aVF, V3 to V5 and ST elevation in aVR and V1.  Cardiologist is aware of elevated troponin and EKG changes.  Chest x-ray on Admission: I personally reviewed and I agree with radiologist reading as below.  DG Chest 1 View  Result Date: 01/11/2021 CLINICAL DATA:  Chest pain EXAM: CHEST  1 VIEW COMPARISON:  01/08/2021 FINDINGS: The heart size and mediastinal contours are within normal limits. Slightly hyperinflated lungs. No focal airspace consolidation, pleural effusion, or pneumothorax. The visualized skeletal structures are unremarkable. IMPRESSION: No active disease.  Electronically Signed   By: Davina Poke D.O.   On: 01/11/2021 11:20   Labs on Admission: I have personally reviewed following labs  CBC: Recent Labs  Lab 01/08/21 1404 01/11/21 1051  WBC 4.6 9.0  HGB 15.2 16.0  HCT 45.1 47.4  MCV 97.8 97.7  PLT 249 270   Basic Metabolic Panel: Recent Labs  Lab 01/08/21 1404 01/11/21 1051  NA 135 135  K 4.2 3.7  CL 94* 96*  CO2 26 26  GLUCOSE 131* 117*  BUN 14 33*  CREATININE 1.52* 1.62*  CALCIUM 9.8 9.9   GFR: Estimated Creatinine Clearance: 37.2 mL/min (A) (by C-G formula based on SCr of 1.62 mg/dL (H)).  Coagulation Profile: Recent Labs  Lab 01/11/21 1051  INR 1.1  Vayden Weinand N Ronnika Collett D.O. [facility]  If 7PM-7AM, please contact overnight-coverage provider If 7AM-7PM, please contact day coverage provider [email or web address]  01/11/2021, 1:49 PM

## 2021-01-12 ENCOUNTER — Inpatient Hospital Stay (HOSPITAL_COMMUNITY)
Admission: AD | Admit: 2021-01-12 | Discharge: 2021-01-13 | DRG: 272 | Disposition: A | Discharge status: Home / Self Care | Payer: Medicare Other | Source: Other Acute Inpatient Hospital | Attending: Cardiology | Admitting: Cardiology

## 2021-01-12 ENCOUNTER — Encounter: Payer: Self-pay | Admitting: Internal Medicine

## 2021-01-12 ENCOUNTER — Inpatient Hospital Stay (HOSPITAL_COMMUNITY)
Admission: AD | Disposition: A | Discharge status: Home / Self Care | Payer: Self-pay | Source: Other Acute Inpatient Hospital | Attending: Cardiology

## 2021-01-12 ENCOUNTER — Encounter (HOSPITAL_COMMUNITY): Payer: Self-pay | Admitting: Cardiology

## 2021-01-12 ENCOUNTER — Encounter
Admission: EM | Disposition: A | Discharge status: Home / Self Care | Payer: Self-pay | Source: Home / Self Care | Attending: Emergency Medicine

## 2021-01-12 DIAGNOSIS — Z7982 Long term (current) use of aspirin: Secondary | ICD-10-CM

## 2021-01-12 DIAGNOSIS — Z85038 Personal history of other malignant neoplasm of large intestine: Secondary | ICD-10-CM

## 2021-01-12 DIAGNOSIS — M1909 Primary osteoarthritis, other specified site: Secondary | ICD-10-CM | POA: Diagnosis present

## 2021-01-12 DIAGNOSIS — Z87891 Personal history of nicotine dependence: Secondary | ICD-10-CM | POA: Diagnosis not present

## 2021-01-12 DIAGNOSIS — I214 Non-ST elevation (NSTEMI) myocardial infarction: Secondary | ICD-10-CM

## 2021-01-12 DIAGNOSIS — N1831 Chronic kidney disease, stage 3a: Secondary | ICD-10-CM | POA: Diagnosis present

## 2021-01-12 DIAGNOSIS — M19011 Primary osteoarthritis, right shoulder: Secondary | ICD-10-CM | POA: Diagnosis present

## 2021-01-12 DIAGNOSIS — Z8249 Family history of ischemic heart disease and other diseases of the circulatory system: Secondary | ICD-10-CM | POA: Diagnosis not present

## 2021-01-12 DIAGNOSIS — Z8616 Personal history of COVID-19: Secondary | ICD-10-CM | POA: Diagnosis not present

## 2021-01-12 DIAGNOSIS — M19012 Primary osteoarthritis, left shoulder: Secondary | ICD-10-CM | POA: Diagnosis present

## 2021-01-12 DIAGNOSIS — Z20822 Contact with and (suspected) exposure to covid-19: Secondary | ICD-10-CM | POA: Diagnosis present

## 2021-01-12 DIAGNOSIS — E785 Hyperlipidemia, unspecified: Secondary | ICD-10-CM | POA: Diagnosis present

## 2021-01-12 DIAGNOSIS — I10 Essential (primary) hypertension: Secondary | ICD-10-CM | POA: Diagnosis not present

## 2021-01-12 DIAGNOSIS — I25110 Atherosclerotic heart disease of native coronary artery with unstable angina pectoris: Secondary | ICD-10-CM

## 2021-01-12 DIAGNOSIS — N183 Chronic kidney disease, stage 3 unspecified: Secondary | ICD-10-CM

## 2021-01-12 DIAGNOSIS — Z79899 Other long term (current) drug therapy: Secondary | ICD-10-CM | POA: Diagnosis not present

## 2021-01-12 DIAGNOSIS — J449 Chronic obstructive pulmonary disease, unspecified: Secondary | ICD-10-CM | POA: Diagnosis present

## 2021-01-12 DIAGNOSIS — I129 Hypertensive chronic kidney disease with stage 1 through stage 4 chronic kidney disease, or unspecified chronic kidney disease: Secondary | ICD-10-CM | POA: Diagnosis present

## 2021-01-12 DIAGNOSIS — Z955 Presence of coronary angioplasty implant and graft: Secondary | ICD-10-CM

## 2021-01-12 DIAGNOSIS — E782 Mixed hyperlipidemia: Secondary | ICD-10-CM | POA: Diagnosis not present

## 2021-01-12 DIAGNOSIS — I2510 Atherosclerotic heart disease of native coronary artery without angina pectoris: Secondary | ICD-10-CM | POA: Diagnosis present

## 2021-01-12 DIAGNOSIS — E1122 Type 2 diabetes mellitus with diabetic chronic kidney disease: Secondary | ICD-10-CM | POA: Diagnosis present

## 2021-01-12 HISTORY — PX: CORONARY STENT INTERVENTION: CATH118234

## 2021-01-12 HISTORY — PX: IABP INSERTION: CATH118242

## 2021-01-12 HISTORY — PX: CORONARY ULTRASOUND/IVUS: CATH118244

## 2021-01-12 HISTORY — PX: LEFT HEART CATH AND CORONARY ANGIOGRAPHY: CATH118249

## 2021-01-12 LAB — SURGICAL PCR SCREEN
MRSA, PCR: NEGATIVE
Staphylococcus aureus: NEGATIVE

## 2021-01-12 LAB — COMPREHENSIVE METABOLIC PANEL
ALT: 13 U/L (ref 0–44)
AST: 21 U/L (ref 15–41)
Albumin: 3.4 g/dL — ABNORMAL LOW (ref 3.5–5.0)
Alkaline Phosphatase: 45 U/L (ref 38–126)
Anion gap: 9 (ref 5–15)
BUN: 30 mg/dL — ABNORMAL HIGH (ref 8–23)
CO2: 26 mmol/L (ref 22–32)
Calcium: 9.2 mg/dL (ref 8.9–10.3)
Chloride: 101 mmol/L (ref 98–111)
Creatinine, Ser: 1.54 mg/dL — ABNORMAL HIGH (ref 0.61–1.24)
GFR, Estimated: 47 mL/min — ABNORMAL LOW (ref >60)
Glucose, Bld: 138 mg/dL — ABNORMAL HIGH (ref 70–99)
Potassium: 3.9 mmol/L (ref 3.5–5.1)
Sodium: 136 mmol/L (ref 135–145)
Total Bilirubin: 1.1 mg/dL (ref 0.3–1.2)
Total Protein: 7 g/dL (ref 6.5–8.1)

## 2021-01-12 LAB — CBC
HCT: 42.2 % (ref 39.0–52.0)
Hemoglobin: 14.4 g/dL (ref 13.0–17.0)
MCH: 33.4 pg (ref 26.0–34.0)
MCHC: 34.1 g/dL (ref 30.0–36.0)
MCV: 97.9 fL (ref 80.0–100.0)
Platelets: 217 10*3/uL (ref 150–400)
RBC: 4.31 MIL/uL (ref 4.22–5.81)
RDW: 13.9 % (ref 11.5–15.5)
WBC: 7 10*3/uL (ref 4.0–10.5)
nRBC: 0 % (ref 0.0–0.2)

## 2021-01-12 LAB — POCT ACTIVATED CLOTTING TIME
Activated Clotting Time: 499 seconds
Activated Clotting Time: 380 seconds
Activated Clotting Time: 184 seconds
Activated Clotting Time: 166 seconds

## 2021-01-12 LAB — LIPID PANEL
Cholesterol: 147 mg/dL (ref 0–200)
HDL: 45 mg/dL (ref >40)
LDL Cholesterol: 87 mg/dL (ref 0–99)
Total CHOL/HDL Ratio: 3.3 RATIO
Triglycerides: 77 mg/dL (ref <150)
VLDL: 15 mg/dL (ref 0–40)

## 2021-01-12 LAB — HEPARIN LEVEL (UNFRACTIONATED): Heparin Unfractionated: 0.38 IU/mL (ref 0.30–0.70)

## 2021-01-12 SURGERY — LEFT HEART CATH AND CORONARY ANGIOGRAPHY
Anesthesia: Moderate Sedation

## 2021-01-12 SURGERY — CORONARY STENT INTERVENTION
Anesthesia: LOCAL

## 2021-01-12 MED ORDER — BUDESONIDE 0.5 MG/2ML IN SUSP
1.0000 mg | Freq: Every day | RESPIRATORY_TRACT | Status: DC
  Administered 2021-01-13: 1 mg via RESPIRATORY_TRACT
  Filled 2021-01-12 (×2): qty 4

## 2021-01-12 MED ORDER — HYDRALAZINE HCL 20 MG/ML IJ SOLN: 10.0000 mg | INTRAMUSCULAR | Status: AC | PRN

## 2021-01-12 MED ORDER — SODIUM CHLORIDE 0.9% FLUSH
3.0000 mL | INTRAVENOUS | Status: DC | PRN
  Administered 2021-01-13: 3 mL via INTRAVENOUS

## 2021-01-12 MED ORDER — SODIUM CHLORIDE 0.9 % IV SOLN
INTRAVENOUS | Status: DC | PRN
  Administered 2021-01-12: 1.75 mg/kg/h via INTRAVENOUS

## 2021-01-12 MED ORDER — SODIUM CHLORIDE 0.9 % IV SOLN: 250.0000 mL | INTRAVENOUS | Status: DC | PRN

## 2021-01-12 MED ORDER — SODIUM CHLORIDE 0.9 % IV SOLN
INTRAVENOUS | Status: DC | PRN
  Administered 2021-01-12: 10 mL/h via INTRAVENOUS

## 2021-01-12 MED ORDER — SODIUM CHLORIDE 0.9 % WEIGHT BASED INFUSION
1.0000 mL/kg/h | INTRAVENOUS | Status: DC
  Administered 2021-01-12: 1 mL/kg/h via INTRAVENOUS

## 2021-01-12 MED ORDER — NITROGLYCERIN 0.4 MG SL SUBL
SUBLINGUAL_TABLET | SUBLINGUAL | Status: AC
  Filled 2021-01-12: qty 1

## 2021-01-12 MED ORDER — TIROFIBAN (AGGRASTAT) BOLUS VIA INFUSION
INTRAVENOUS | Status: DC | PRN
  Administered 2021-01-12: 1565 ug via INTRAVENOUS

## 2021-01-12 MED ORDER — SODIUM CHLORIDE 0.9% FLUSH: 3.0000 mL | Freq: Two times a day (BID) | INTRAVENOUS | Status: DC

## 2021-01-12 MED ORDER — HYDRALAZINE HCL 20 MG/ML IJ SOLN: 10.0000 mg | INTRAMUSCULAR | Status: DC | PRN

## 2021-01-12 MED ORDER — SODIUM CHLORIDE 0.9 % WEIGHT BASED INFUSION
1.5000 mL/kg/h | INTRAVENOUS | Status: AC
  Administered 2021-01-12 (×2): 1.5 mL/kg/h via INTRAVENOUS

## 2021-01-12 MED ORDER — ATORVASTATIN CALCIUM 80 MG PO TABS
80.0000 mg | ORAL_TABLET | ORAL | Status: AC
  Administered 2021-01-12: 80 mg via ORAL
  Filled 2021-01-12: qty 1

## 2021-01-12 MED ORDER — SODIUM CHLORIDE 0.9% FLUSH: 3.0000 mL | INTRAVENOUS | Status: DC | PRN

## 2021-01-12 MED ORDER — CHLORHEXIDINE GLUCONATE CLOTH 2 % EX PADS: 6.0000 | MEDICATED_PAD | Freq: Every day | CUTANEOUS | Status: DC

## 2021-01-12 MED ORDER — NITROGLYCERIN 1 MG/10 ML FOR IR/CATH LAB
INTRA_ARTERIAL | Status: AC
  Filled 2021-01-12: qty 10

## 2021-01-12 MED ORDER — HEPARIN (PORCINE) IN NACL 1000-0.9 UT/500ML-% IV SOLN
INTRAVENOUS | Status: AC
  Filled 2021-01-12: qty 1000

## 2021-01-12 MED ORDER — ASPIRIN 81 MG PO CHEW: 81.0000 mg | CHEWABLE_TABLET | ORAL | Status: DC

## 2021-01-12 MED ORDER — MIDAZOLAM HCL 2 MG/2ML IJ SOLN
INTRAMUSCULAR | Status: DC | PRN
  Administered 2021-01-12: 1 mg via INTRAVENOUS

## 2021-01-12 MED ORDER — NITROGLYCERIN 0.4 MG SL SUBL
SUBLINGUAL_TABLET | SUBLINGUAL | Status: DC | PRN
  Administered 2021-01-12: .4 mg via SUBLINGUAL

## 2021-01-12 MED ORDER — LIDOCAINE HCL (PF) 1 % IJ SOLN
INTRAMUSCULAR | Status: AC
  Filled 2021-01-12: qty 30

## 2021-01-12 MED ORDER — SODIUM CHLORIDE 0.9% FLUSH
3.0000 mL | Freq: Two times a day (BID) | INTRAVENOUS | Status: DC
  Administered 2021-01-13 (×2): 3 mL via INTRAVENOUS

## 2021-01-12 MED ORDER — SODIUM CHLORIDE 0.9 % WEIGHT BASED INFUSION: 1.0000 mL/kg/h | INTRAVENOUS | Status: DC

## 2021-01-12 MED ORDER — ASPIRIN 81 MG PO CHEW
81.0000 mg | CHEWABLE_TABLET | Freq: Every day | ORAL | Status: DC
  Administered 2021-01-13: 81 mg via ORAL
  Filled 2021-01-12: qty 1

## 2021-01-12 MED ORDER — LIDOCAINE HCL (PF) 1 % IJ SOLN
INTRAMUSCULAR | Status: DC | PRN
  Administered 2021-01-12: 2 mL
  Administered 2021-01-12: 15 mL

## 2021-01-12 MED ORDER — HEPARIN (PORCINE) IN NACL 1000-0.9 UT/500ML-% IV SOLN
INTRAVENOUS | Status: DC | PRN
  Administered 2021-01-12 (×2): 500 mL

## 2021-01-12 MED ORDER — HEPARIN (PORCINE) IN NACL 1000-0.9 UT/500ML-% IV SOLN
INTRAVENOUS | Status: DC | PRN
  Administered 2021-01-12: 500 mL

## 2021-01-12 MED ORDER — FENTANYL CITRATE (PF) 100 MCG/2ML IJ SOLN
INTRAMUSCULAR | Status: DC | PRN
  Administered 2021-01-12: 50 ug via INTRAVENOUS

## 2021-01-12 MED ORDER — ONDANSETRON HCL 4 MG/2ML IJ SOLN: 4.0000 mg | Freq: Four times a day (QID) | INTRAMUSCULAR | Status: DC | PRN

## 2021-01-12 MED ORDER — ATORVASTATIN CALCIUM 80 MG PO TABS
80.0000 mg | ORAL_TABLET | Freq: Every day | ORAL | Status: DC
  Administered 2021-01-12: 80 mg via ORAL
  Filled 2021-01-12: qty 1

## 2021-01-12 MED ORDER — LIDOCAINE HCL (PF) 1 % IJ SOLN
INTRAMUSCULAR | Status: DC | PRN
  Administered 2021-01-12: 20 mL

## 2021-01-12 MED ORDER — ACETAMINOPHEN 325 MG PO TABS
650.0000 mg | ORAL_TABLET | ORAL | Status: DC | PRN
  Administered 2021-01-13: 650 mg via ORAL
  Filled 2021-01-12: qty 2

## 2021-01-12 MED ORDER — NITROGLYCERIN 0.3 MG SL SUBL: 0.3000 mg | SUBLINGUAL_TABLET | SUBLINGUAL | Status: DC

## 2021-01-12 MED ORDER — LABETALOL HCL 5 MG/ML IV SOLN: 10.0000 mg | INTRAVENOUS | Status: DC | PRN

## 2021-01-12 MED ORDER — SODIUM CHLORIDE 0.9 % IV SOLN: INTRAVENOUS | Status: DC

## 2021-01-12 MED ORDER — VERAPAMIL HCL 2.5 MG/ML IV SOLN
INTRAVENOUS | Status: AC
  Filled 2021-01-12: qty 2

## 2021-01-12 MED ORDER — IOHEXOL 300 MG/ML  SOLN
INTRAMUSCULAR | Status: DC | PRN
  Administered 2021-01-12: 100 mL

## 2021-01-12 MED ORDER — TICAGRELOR 90 MG PO TABS
90.0000 mg | ORAL_TABLET | Freq: Two times a day (BID) | ORAL | Status: DC
  Administered 2021-01-13 (×2): 90 mg via ORAL
  Filled 2021-01-12 (×2): qty 1

## 2021-01-12 MED ORDER — TICAGRELOR 90 MG PO TABS
ORAL_TABLET | ORAL | Status: AC
  Filled 2021-01-12: qty 2

## 2021-01-12 MED ORDER — HEPARIN (PORCINE) 25000 UT/250ML-% IV SOLN
900.0000 [IU]/h | INTRAVENOUS | Status: DC
  Filled 2021-01-12: qty 250

## 2021-01-12 MED ORDER — ATORVASTATIN CALCIUM 80 MG PO TABS: 80.0000 mg | ORAL_TABLET | Freq: Every day | ORAL | Status: DC

## 2021-01-12 MED ORDER — BIVALIRUDIN BOLUS VIA INFUSION - CUPID
INTRAVENOUS | Status: DC | PRN
  Administered 2021-01-12: 46.95 mg via INTRAVENOUS

## 2021-01-12 MED ORDER — IOHEXOL 350 MG/ML SOLN
INTRAVENOUS | Status: DC | PRN
  Administered 2021-01-12: 110 mL

## 2021-01-12 MED ORDER — FENTANYL CITRATE (PF) 100 MCG/2ML IJ SOLN
INTRAMUSCULAR | Status: AC
  Filled 2021-01-12: qty 2

## 2021-01-12 MED ORDER — HEPARIN SODIUM (PORCINE) 1000 UNIT/ML IJ SOLN
INTRAMUSCULAR | Status: AC
  Filled 2021-01-12: qty 1

## 2021-01-12 MED ORDER — FENTANYL CITRATE (PF) 100 MCG/2ML IJ SOLN
50.0000 ug | Freq: Once | INTRAMUSCULAR | Status: AC
  Administered 2021-01-12: 50 ug via INTRAVENOUS

## 2021-01-12 MED ORDER — ALBUTEROL SULFATE HFA 108 (90 BASE) MCG/ACT IN AERS
2.0000 | INHALATION_SPRAY | RESPIRATORY_TRACT | Status: DC | PRN
  Filled 2021-01-12: qty 6.7

## 2021-01-12 MED ORDER — BIVALIRUDIN TRIFLUOROACETATE 250 MG IV SOLR
INTRAVENOUS | Status: AC
  Filled 2021-01-12: qty 250

## 2021-01-12 MED ORDER — MIDAZOLAM HCL 2 MG/2ML IJ SOLN
INTRAMUSCULAR | Status: AC
  Filled 2021-01-12: qty 2

## 2021-01-12 MED ORDER — TICAGRELOR 90 MG PO TABS
ORAL_TABLET | ORAL | Status: DC | PRN
  Administered 2021-01-12: 180 mg via ORAL

## 2021-01-12 MED ORDER — HEPARIN (PORCINE) 25000 UT/250ML-% IV SOLN
INTRAVENOUS | Status: AC
  Filled 2021-01-12: qty 250

## 2021-01-12 MED ORDER — SODIUM CHLORIDE 0.9 % WEIGHT BASED INFUSION
3.0000 mL/kg/h | INTRAVENOUS | Status: DC
  Administered 2021-01-12: 3 mL/kg/h via INTRAVENOUS

## 2021-01-12 MED ORDER — LABETALOL HCL 5 MG/ML IV SOLN: 10.0000 mg | INTRAVENOUS | Status: AC | PRN

## 2021-01-12 MED ORDER — ASPIRIN 81 MG PO CHEW: 81.0000 mg | CHEWABLE_TABLET | Freq: Every day | ORAL | Status: DC

## 2021-01-12 MED ORDER — FENTANYL CITRATE (PF) 100 MCG/2ML IJ SOLN
INTRAMUSCULAR | Status: DC | PRN
  Administered 2021-01-12: 25 ug via INTRAVENOUS

## 2021-01-12 MED ORDER — IOHEXOL 350 MG/ML SOLN
INTRAVENOUS | Status: AC
  Filled 2021-01-12: qty 1

## 2021-01-12 MED ORDER — ACETAMINOPHEN 325 MG PO TABS: 650.0000 mg | ORAL_TABLET | ORAL | Status: DC | PRN

## 2021-01-12 MED ORDER — NITROGLYCERIN 0.4 MG SL SUBL
0.4000 mg | SUBLINGUAL_TABLET | SUBLINGUAL | Status: DC | PRN
  Administered 2021-01-12 – 2021-01-13 (×2): 0.4 mg via SUBLINGUAL
  Filled 2021-01-12 (×2): qty 1

## 2021-01-12 SURGICAL SUPPLY — 23 items
BALLN SAPPHIRE 1.5X15 (BALLOONS) ×2
BALLN SAPPHIRE ~~LOC~~ 2.5X15 (BALLOONS) ×2 IMPLANT
BALLN SAPPHIRE ~~LOC~~ 4.0X12 (BALLOONS) ×2 IMPLANT
BALLOON SAPPHIRE 1.5X15 (BALLOONS) ×1 IMPLANT
CATH OPTICROSS HD (CATHETERS) ×2 IMPLANT
CATH VENTURE RX (CATHETERS) ×2 IMPLANT
CATH VISTA GUIDE 6FR XB3.5 (CATHETERS) ×2 IMPLANT
CATH VISTA GUIDE 6FR XB4 (CATHETERS) ×2 IMPLANT
GLIDESHEATH SLEND A-KIT 6F 22G (SHEATH) ×2 IMPLANT
GUIDEWIRE INQWIRE 1.5J.035X260 (WIRE) ×1 IMPLANT
INQWIRE 1.5J .035X260CM (WIRE) ×2
KIT ENCORE 26 ADVANTAGE (KITS) ×2 IMPLANT
KIT HEART LEFT (KITS) ×2 IMPLANT
PACK CARDIAC CATHETERIZATION (CUSTOM PROCEDURE TRAY) ×2 IMPLANT
SHEATH PINNACLE 6F 10CM (SHEATH) ×2 IMPLANT
SHEATH PROBE COVER 6X72 (BAG) ×2 IMPLANT
SLED PULL BACK IVUS (MISCELLANEOUS) ×2 IMPLANT
STENT RESOLUTE ONYX 4.0X15 (Permanent Stent) ×2 IMPLANT
TRANSDUCER W/STOPCOCK (MISCELLANEOUS) ×2 IMPLANT
TUBING CIL FLEX 10 FLL-RA (TUBING) ×2 IMPLANT
WIRE ASAHI PROWATER 180CM (WIRE) ×2 IMPLANT
WIRE ASAHI PROWATER 300CM (WIRE) ×2 IMPLANT
WIRE EMERALD 3MM-J .035X150CM (WIRE) ×2 IMPLANT

## 2021-01-12 SURGICAL SUPPLY — 12 items
BALLN IABP SENSA PLUS 8F 50CC (BALLOONS) ×2
BALLOON IABP SENS PLUS 8F 50CC (BALLOONS) ×1 IMPLANT
CATH INFINITI 5FR ANG PIGTAIL (CATHETERS) ×2 IMPLANT
CATH INFINITI 5FR JL4 (CATHETERS) ×2 IMPLANT
CATH INFINITI JR4 5F (CATHETERS) ×2 IMPLANT
KIT MANI 3VAL PERCEP (MISCELLANEOUS) ×2 IMPLANT
KIT TRANSPAC II SGL 4260605 (MISCELLANEOUS) ×2 IMPLANT
NEEDLE PERC 18GX7CM (NEEDLE) ×2 IMPLANT
PACK CARDIAC CATH (CUSTOM PROCEDURE TRAY) ×2 IMPLANT
SHEATH AVANTI 5FR X 11CM (SHEATH) ×2 IMPLANT
SUT SILK 0 FSL (SUTURE) ×2 IMPLANT
WIRE GUIDERIGHT .035X150 (WIRE) ×2 IMPLANT

## 2021-01-12 NOTE — Progress Notes (Signed)
This chaplain was paged by the unit for spiritual care.  The chaplain appreciates the MD prayer. The chaplain is pastorally present with the Pt. wife-Evelyn while the Pt. is in the Cath Lab.  Estill Bamberg is sitting alone in the room.   The chaplain listened to Estill Bamberg as she reflected on life with the Pt. Intersections of the couples spirituality is present in the story telling. The chaplain understands the spiritual care visit allowed Estill Bamberg to ask questions about mortality and grief.  The chaplain offered F/U spiritual care as needed.

## 2021-01-12 NOTE — Progress Notes (Signed)
Site area: Left groin a 6 french arterial sheath was removed.  Site Prior to Removal:  Level 0  Pressure Applied For 30 MINUTES     Bedrest Beginning at 2000pm  Manual:   Yes.    Patient Status During Pull:  stable  Post Pull Groin Site:  Level 0  Post Pull Instructions Given:  Yes.    Post Pull Pulses Present:  Yes.    Dressing Applied:  Yes.    Comments:

## 2021-01-12 NOTE — Consult Note (Signed)
INTERVENTIONAL CARDIOLOGY  75 year old gentleman with smoking history, CKD stage III, and non-ST elevation presentation with ongoing chest pain despite intra-aortic balloon pump placed today for pain control.  Despite intra-aortic balloon pump, the patient's EKG continues to reveal widespread ischemia presumed secondary to the circumflex coronary artery which has TIMI grade II flow.  Angiography Images from [facility] have been reviewed and he has severe advanced calcified three-vessel coronary disease with diffuse  distal targets not amenable to coronary artery bypass grafting.  Angiography has been reviewed by [facility] with that conclusion communicated with Dr. Marijo File.  Medical problems include hypertension, hyperlipidemia, ulcer, diabetes mellitus type 2.  On exam, the patient is able to lie flat.  Neck veins are flat.  Bilateral radial arteries are noncompressible implying coronary diffuse severe calcification.  Arteries feel like cords.  Cardiac exam does not reveal a gallop.  Chest is clear.  Right femoral has intra-aortic balloon pump.  Left femoral is has a 2+ to 3+ pulse.  It is easily palpable.  He is neurologically intact.  He is psychologically very anxious and concerned about dying as his older brother recently died of complications of ischemic heart disease.  Data: Echo with EF 50 to 55% and inferior wall hypokinesis.  Ongoing ischemic chest pain despite intra-aortic balloon pump in the patient with a very complicated anatomic subset including a right angle circumflex that appears to be heavily calcified left main.  Diffuse distal vessel disease without targets for bypass surgery.  He has been turned down for surgery.  Our plan is to wire the circumflex, reestablish flow, calcium suggests he may need debulking although this would not be a good idea in the setting of ACS with thrombus.  If we are unable to expand the artery, perhaps shockwave lithotripsy would be an  option.  Discussed with the patient and his wife.  Describes this as a bailout attempt to provide stability.  I described risk of myocardial infarction to be in the 20% range.  Risk of death probably 3-5 fold increased compared to baseline PCI.  We discussed the possibility of inability to cross with wire, inability to deliver stent, inability to expand the stent.  Surgical backup vessel closure will not be possible as he has been turned down for CABG. This is a salvage type procedure to control pain and prevent further myocardial damage.[name] Kitchen  He is hemodynamically stable.  His LV function is normal as well.

## 2021-01-12 NOTE — CV Procedure (Signed)
   99% ostial to proximal circumflex with TIMI grade II flow reduced to 0% stenosis with TIMI grade III flow.  There is mild stent overhang into the left main which is large.  Intravascular ultrasound shows evidence of reasonable stent expansion with the exception of the distal 3rd which improved after high-pressure inflation with 4.0 [place] balloon.  No immediate complications.  Left femoral use because of calcified radial arteries.  Aggrastat was used as a bolus only.  Patient was loaded with Brilinta.  Angiomax was the anticoagulant.  Drip is stopped in the Cath Lab after the procedure.  Intra-aortic balloon pump reduced to 1:2 pumping and should be able to come out later this evening.  So far so good!!!

## 2021-01-12 NOTE — Progress Notes (Signed)
[facility] here for transport.  

## 2021-01-12 NOTE — H&P (Addendum)
Cardiology Admission History and Physical:   Patient ID: Carl Townsend MRN: [id number]; DOB: 08/03/1946   Admission date: 01/12/2021  Primary Care Provider: [name], MD [facility] Cardiologist: Dr. Sherlon Handing [facility] Electrophysiologist:  None   Chief Complaint:  NSTEMI  Patient Profile:   Carl Townsend is a 75 y.o. male with a PMH of HTN, CKD, and remote tobacco abuse, who presented to [facility] with recurrent chest pain, transferred to [facility] emergently following LHC with balloon pump.  History of Present Illness:   [name] was in his usual state of health until ~1 week ago when he began experiencing intermittent chest pain. He reported initial episode of chest pain occurred 01/04/21 with 10/10 chest pressure at rest lasting 20-30 minutes before resolving spontaneously. He continued to have intermittent episodes prompting him to present to the ED 01/08/21 where he reported chest tightness and SOB. BP was significantly elevated to 184/98 at that time with non-ischemic EKG, and HsTrop 14>35. CXR did suggest COPD and he was started prednisone and an albuterol inhaler. His case was discussed with cardiology at that time and was felt his symptoms/work-up were not c/w ACS and outpatient follow-up was recommended. He continued to have chest pain following this ED visit. He was seen by Dr. Clayborn Bigness outpatient 01/11/21 and was recommended to present back to the ED for further evaluation.   He was admitted to medicine at [facility]. HsTrop elevated to 1574 and trended down to 1446. BNP 720. EKG with significant baseline wander, though obvious STD in inferolateral leads identified. Dr. Clayborn Bigness with cardiology consulted. Echocardiogram showed EF 50-55%, normal LV diastolic function, +RWMA, normal RV function/size, and no significant valvular abnormalities. He was taken to the cath lab today where he was found to have minor LM disease, 75-80%, pLAD stenosis, 99% LCx stenosis, and  diffuse distal PL PDA disease, with inferolateral hypokinesis. An intra-aortic balloon pump placed at the end of the case and patient was transferred emergently to [facility] for further management. Case was discussed with TCTS, Dr. Prescott Gum who felt patient had poor targets for CABG and would be better suited for high-risk PCI. Dr. Tamala Julian evaluated the patient and plans to take to the cath lab this afternoon.   He has a significant family history of CAD with father passing from [place], brother passing from a 18rd MI in his 45s, and younger brother with multiple stents first placed in his 72s. Also with significant tobacco use history - smoked for 40 years.   At the time of this evaluation he continues to have 4/10 chest pain. He is very anxious about the course of events. He reports worsening chest pain when laying down. He has had some SOB. No complaints of LE edema, orthopnea, PND, dizziness, lightheadedness, or syncope.    Past Medical History:  Diagnosis Date  . Arthritis    neck and shoulders  . Chronic kidney disease    chronic kidney disease - stage 3, GFR 30-59 ml/min  . Colon cancer ([place])   . ED (erectile dysfunction)   . History of 2019 novel coronavirus disease (COVID-19) 08/23/2020  . Hyperlipidemia   . Hypertension   . Inguinal hernia   . Pre-diabetes     Past Surgical History:  Procedure Laterality Date  . COLON SURGERY Right 05/06   colectomy and sigmoid colectomy   . COLONOSCOPY WITH PROPOFOL N/A 03/13/2016   Procedure: COLONOSCOPY WITH PROPOFOL;  Surgeon: Lollie Sails, MD;  Location: [facility] ENDOSCOPY;  Service: Endoscopy;  Laterality:  N/A;  . COLONOSCOPY WITH PROPOFOL N/A 03/09/2020   Procedure: COLONOSCOPY WITH PROPOFOL;  Surgeon: Toledo, Benay Pike, MD;  Location: [facility] ENDOSCOPY;  Service: Gastroenterology;  Laterality: N/A;  . EYE SURGERY Left    vitreous retinal surgery  . right foot surgery    . XI ROBOTIC ASSISTED INGUINAL HERNIA REPAIR WITH MESH Bilateral 11/10/2020    Procedure: XI ROBOTIC ASSISTED INGUINAL HERNIA REPAIR WITH MESH;  Surgeon: Benjamine Sprague, DO;  Location: [facility] ORS;  Service: General;  Laterality: Bilateral;     Medications Prior to Admission: Prior to Admission medications   Medication Sig Start Date End Date Taking? Authorizing Provider  albuterol (VENTOLIN HFA) 108 (90 Base) MCG/ACT inhaler Inhale 2 puffs into the lungs every 4 (four) hours as needed for wheezing or shortness of breath. 01/08/21   Cuthriell, Charline Bills, PA-C  ascorbic acid (VITAMIN C) 500 MG tablet Take 500 mg by mouth daily.    [provider]  aspirin 81 MG tablet Take 81 mg by mouth daily.    [provider]  atenolol (TENORMIN) 100 MG tablet Take 100 mg by mouth at bedtime.    [provider]  budesonide (PULMICORT) 1 MG/2ML nebulizer solution Inhale 2 mLs into the lungs daily. 01/10/21 01/10/22  [provider]  candesartan (ATACAND) 32 MG tablet Take 32 mg by mouth daily. 12/16/20   [provider]  Carboxymethylcellulose Sodium (REFRESH LIQUIGEL) 1 % GEL Place 1 drop into both eyes at bedtime.     [provider]  cyanocobalamin 1000 MCG tablet Take 1,000 mcg by mouth daily.    [provider]  ergocalciferol (VITAMIN D2) 1.25 MG (50000 UT) capsule Take 50,000 Units by mouth once a week.    [provider]  Glycerin-Polysorbate 80 (REFRESH DRY EYE THERAPY OP) Place 1 drop into both eyes daily as needed (dry eye).    [provider]  Multiple Vitamin (MULTIVITAMIN) tablet Take 1 tablet by mouth daily.    [provider]  nitroGLYCERIN (NITROSTAT) 0.3 MG SL tablet Place 0.3 mg under the tongue as directed. 01/10/21 01/10/22  [provider]  predniSONE (DELTASONE) 50 MG tablet Take 1 tablet (50 mg total) by mouth daily with breakfast. 01/08/21   Cuthriell, Charline Bills, PA-C  sildenafil (VIAGRA) 100 MG tablet Take 100 mg by mouth daily as needed for erectile dysfunction.    [provider]  zinc gluconate 50 MG tablet Take 50 mg by mouth daily.    [provider]     Allergies:   No Known Allergies  Social History:   Social History   Socioeconomic History  . Marital status: Married    Spouse name: Not on file  . Number of children: Not on file  . Years of education: Not on file  . Highest education level: Not on file  Occupational History  . Not on file  Tobacco Use  . Smoking status: Former Smoker    Quit date: 2000    Years since quitting: 22.0  . Smokeless tobacco: Never Used  Vaping Use  . Vaping Use: Never used  Substance and Sexual Activity  . Alcohol use: No    Comment: glass of wine "once in a while"   . Drug use: No  . Sexual activity: Not on file  Other Topics Concern  . Not on file  Social History Narrative  . Not on file   Social Determinants of Health   Financial Resource Strain: Not on file  Food Insecurity: Not on file  Transportation Needs: Not on file  Physical Activity: Not on file  Stress: Not on file  Social Connections: Not on file  Intimate Partner Violence: Not on file    Family History:   The patient's family history includes Heart attack in his brother and father; Heart disease in his brother; Heart failure in his mother.    ROS:  Please see the history of present illness.  All other ROS reviewed and negative.     Physical Exam/Data:   Vitals:   01/12/21 1145 01/12/21 1156 01/12/21 1200 01/12/21 1215  BP: 139/80 131/73 124/62 (!) 108/54  Pulse: 73 70 70 66  Resp: 13 13 12 14   Temp:      TempSrc:      SpO2: 100% 100% 100% 99%   No intake or output data in the 24 hours ending 01/12/21 1320 Last 3 Weights 01/12/2021 01/11/2021 01/08/2021  Weight (lbs) 137 lb 14.4 oz 145 lb 145 lb  Weight (kg) 62.551 kg 65.772 kg 65.772 kg     There is no height or weight on file to calculate BMI.  General:  Elderly AA gentleman who is laying in bed in NAD HEENT: sclera anicteric Neck: no JVD Vascular: No  carotid bruits; distal pulses 2+ bilaterally  Cardiac:  normal S1, S2; RRR; no murmurs, rubs, or gallops; balloon pump in right groin Lungs:  clear to auscultation bilaterally, no wheezing, rhonchi or rales  Abd: soft, nontender, no hepatomegaly  Ext: no edema Musculoskeletal:  No deformities, BUE and BLE strength normal and equal Skin: warm and dry  Neuro:  CNs 2-12 intact, no focal abnormalities noted Psych:  Normal affect    EKG:  Sinus rhythm, rate 75 bpm, STD in inferolateral leads, no STE.   Relevant CV Studies:  Echocardiogram 01/11/21: 1. Inferior Hypokinesis.  2. Left ventricular ejection fraction, by estimation, is 50 to 55%. The  left ventricle has low normal function. The left ventricle demonstrates  regional wall motion abnormalities (see scoring diagram/findings for  description). Left ventricular diastolic  parameters were normal.  3. Right ventricular systolic function is normal. The right ventricular  size is normal.  4. The mitral valve is grossly normal. No evidence of mitral valve  regurgitation.  5. The aortic valve is grossly normal. Aortic valve regurgitation is not  visualized.   [place] 01/12/21: Brief cath note Indication non-STEMI unstable angina Abnormal EKG diffuse ST depression  Patient was brought to the cardiac Cath Lab 5 French sheath was placed in the right femoral artery Left system Left main minor disease ectatic LAD ectatic proximally diffuse disease distally moderate to severe 75 to 80% mid Circumflex 99% ostially TIMI I flow IRA diffusely ectatic slow flow RCA large relatively free of disease except distally has diffuse disease PL PDA Minimal luminal collaterals LV function mild to moderately depressed inferior lateral hypo-  Patient having intractable angina on the table Intra-aortic balloon pump was placed at the end of the case to help with relief of angina and ischemia Heparin restarted Case discussed with Dr. Percival Spanish at  [facility] to help with transfer emergently Family notified Full Note to follow  Laboratory Data:  High Sensitivity Troponin:   Recent Labs  Lab 01/08/21 1404 01/08/21 1604 01/11/21 1051 01/11/21 1255  TROPONINIHS 14 35* 1,574* 1,446*      Chemistry Recent Labs  Lab 01/11/21 1051 01/12/21 0252  NA 135 136  K 3.7 3.9  CL 96* 101  CO2 26 26  GLUCOSE 117* 138*  BUN 33* 30*  CREATININE 1.62* 1.54*  CALCIUM 9.9 9.2  GFRNONAA 44* 47*  ANIONGAP 13 9    Recent Labs  Lab 01/12/21 0252  PROT 7.0  ALBUMIN 3.4*  AST 21  ALT 13  ALKPHOS 45  BILITOT 1.1   Hematology Recent Labs  Lab 01/11/21 1051 01/12/21 0252  WBC 9.0 7.0  RBC 4.85 4.31  HGB 16.0 14.4  HCT 47.4 42.2  MCV 97.7 97.9  MCH 33.0 33.4  MCHC 33.8 34.1  RDW 13.7 13.9  PLT 273 217   BNP Recent Labs  Lab 01/11/21 1347  BNP 720.9*    DDimer No results for input(s): DDIMER in the last 168 hours.   Radiology/Studies:  ECHOCARDIOGRAM COMPLETE  Result Date: 01/11/2021    ECHOCARDIOGRAM REPORT   Patient Name:   PIERCE ARMAGOST Date of Exam: 01/11/2021 Medical Rec #:  [id number]            Height:       71.0 in Accession #:    [id number]           Weight:       145.0 lb Date of Birth:  Aug 11, 1946            BSA:          1.839 m Patient Age:    63 years             BP:           143/74 mmHg Patient Gender: M                    HR:           50 bpm. Exam Location:  [facility] Procedure: 2D Echo, Cardiac Doppler and Color Doppler Indications:     Acute myocardial infarction -unspecified I21.9  History:         Patient has no prior history of Echocardiogram examinations.                  Risk Factors:Hypertension and Dyslipidemia.  Sonographer:     Sherrie Sport RDCS (AE) Referring Phys:  [id number] [facility] D CALLWOOD Diagnosing Phys: Yolonda Kida MD  Sonographer Comments: Suboptimal parasternal window. IMPRESSIONS  1. Inferior Hypokinesis.  2. Left ventricular ejection fraction, by estimation, is 50 to 55%. The left  ventricle has low normal function. The left ventricle demonstrates regional wall motion abnormalities (see scoring diagram/findings for description). Left ventricular diastolic  parameters were normal.  3. Right ventricular systolic function is normal. The right ventricular size is normal.  4. The mitral valve is grossly normal. No evidence of mitral valve regurgitation.  5. The aortic valve is grossly normal. Aortic valve regurgitation is not visualized. FINDINGS  Left Ventricle: Inferior Hypo. Left ventricular ejection fraction, by estimation, is 50 to 55%. The left ventricle has low normal function. The left ventricle demonstrates regional wall motion abnormalities. The left ventricular internal cavity size was  normal in size. There is no left ventricular hypertrophy. Left ventricular diastolic parameters were normal. Right Ventricle: The right ventricular size is normal. No increase in right ventricular wall thickness. Right ventricular systolic function is normal. Left Atrium: Left atrial size was normal in size. Right Atrium: Right atrial size was normal in size. Pericardium: There is no evidence of pericardial effusion. Mitral Valve: The mitral valve is grossly normal. No evidence of mitral valve regurgitation. Tricuspid Valve: The tricuspid valve is normal in structure. Tricuspid valve regurgitation  is trivial. Aortic Valve: The aortic valve is grossly normal. Aortic valve regurgitation is not visualized. Aortic valve mean gradient measures 1.0 mmHg. Aortic valve peak gradient measures 1.5 mmHg. Aortic valve area, by VTI measures 3.48 cm. Pulmonic Valve: The pulmonic valve was grossly normal. Pulmonic valve regurgitation is not visualized. Aorta: The aortic arch was not well visualized. IAS/Shunts: No atrial level shunt detected by color flow Doppler. Additional Comments: Inferior Hypokinesis. There is no pleural effusion.  LEFT VENTRICLE PLAX 2D LVIDd:         3.63 cm  Diastology LVIDs:         2.63 cm   LV e' medial:    4.35 cm/s LV PW:         1.20 cm  LV E/e' medial:  16.9 LV IVS:        1.46 cm  LV e' lateral:   5.55 cm/s LVOT diam:     2.20 cm  LV E/e' lateral: 13.2 LV SV:         45 LV SV Index:   25 LVOT Area:     3.80 cm  RIGHT VENTRICLE RV S prime:     12.80 cm/s TAPSE (M-mode): 3.1 cm LEFT ATRIUM             Index       RIGHT ATRIUM           Index LA diam:        3.00 cm 1.63 cm/m  RA Area:     13.10 cm LA Vol (A2C):   24.6 ml 13.38 ml/m RA Volume:   31.60 ml  17.18 ml/m LA Vol (A4C):   27.7 ml 15.06 ml/m LA Biplane Vol: 26.5 ml 14.41 ml/m  AORTIC VALVE                   PULMONIC VALVE AV Area (Vmax):    3.00 cm    PV Vmax:        0.40 m/s AV Area (Vmean):   2.99 cm    PV Peak grad:   0.6 mmHg AV Area (VTI):     3.48 cm    RVOT Peak grad: 7 mmHg AV Vmax:           60.30 cm/s AV Vmean:          41.200 cm/s AV VTI:            0.130 m AV Peak Grad:      1.5 mmHg AV Mean Grad:      1.0 mmHg LVOT Vmax:         47.60 cm/s LVOT Vmean:        32.400 cm/s LVOT VTI:          0.119 m LVOT/AV VTI ratio: 0.92  AORTA Ao Root diam: 3.73 cm MITRAL VALVE               TRICUSPID VALVE MV Area (PHT): 6.71 cm    TR Peak grad:   19.5 mmHg MV Decel Time: 113 msec    TR Vmax:        221.00 cm/s MV E velocity: 73.30 cm/s MV A velocity: 45.00 cm/s  SHUNTS MV E/A ratio:  1.63        Systemic VTI:  0.12 m                            Systemic Diam: 2.20 cm Dwayne  Prince Rome MD Electronically signed by Yolonda Kida MD Signature Date/Time: 01/11/2021/6:10:24 PM    Final      Assessment and Plan:   1. NSTEMI: Patient presented to [facility] with chest pain, found to have inferolateral STD on EKG and HsTrop elevated to 1500s. Echo with EF 50-55% with RWMA. He underwent LHC which showed severe stenosis in LAD and LCx, as well as PDA. Balloon pump placed at the end of his procedure given ongoing angina. He was transferred to [facility] for further management. Case reviewed with TCTS, Dr. Prescott Gum, who felt he had poor targets for  CABG and recommended high-risk PCI.  - Dr. Tamala Julian discussed with patient and his wife, will take to cath lab for high-risk PCI this afternoon - Continue heparin gtt for now - Continue aspirin - Will start statin   2. HTN: BP intermittently soft on A-line reading. On atenolol and candesartan at home - Anticipate starting BBlocker if BP stable post cath - Continue to hold candesartan for now  3. HLD: LDL 87 on labs this admission; goal <70 in light of cath - Will start atorvastatin 80mg  daily - Will need repeat FLP/LFTs in 6-8 weeks for close monitoring  4. CKD stage 3: Cr 1.5 on 01/08/2021 and 1.54 today. He has been receiving gentle hydration in anticipation of repeat cath - Continue to monitor Cr closely       TIMI Risk Score for Unstable Angina or Non-ST Elevation MI:   The patient's TIMI risk score is  , which indicates a  % risk of all cause mortality, new or recurrent myocardial infarction or need for urgent revascularization in the next 14 days.      Severity of Illness: The appropriate patient status for this patient is OBSERVATION. Observation status is judged to be reasonable and necessary in order to provide the required intensity of service to ensure the patient's safety. The patient's presenting symptoms, physical exam findings, and initial radiographic and laboratory data in the context of their medical condition is felt to place them at decreased risk for further clinical deterioration. Furthermore, it is anticipated that the patient will be medically stable for discharge from the hospital within 2 midnights of admission. The following factors support the patient status of observation.   " The patient's presenting symptoms include chest pain. " The physical exam findings include balloon pump in right groin. " The initial radiographic and laboratory data are Elevated HsTrop and STD on EKG.     For questions or updates, please contact [place] Please consult  [email or web address] for contact info under     Signed, Abigail Butts, PA-C  01/12/2021 1:20 PM   History and all data above reviewed.  Patient examined.  I agree with the findings as above.  The patient reports a history of chest pain or years managed medically and followed with stress tests.  Now with chest pain for several days.  Off and on but severe.  Presented initially to the ED but sent home from [place].  Came back with for office visit and referred for admission.  Cath as above with ongoing evidence of acute subendocardial ischemia and probable latera acute injury pattern.  Transferred from [place] with IABP in place.  Currently having active pain. Eventually The patient exam reveals COR:RRR  ,  Lungs: Clear  ,  Abd: Positive bowel sounds, no rebound no guarding, Ext Decreased bilateral pulses  .  All available labs, radiology testing, previous records reviewed. Agree with documented  assessment and plan.   ACS:  Films reviewed with Dr. Tamala Julian, Dr. Irish Lack and consult to TCTS in place.  Difficult situation.  Discussed with the patient. He does not have targets for CABG other than possible single vessel to circ.  LAD and distal RCA with diffuse disease.  The ostial circ has high grade obstructive disease with TIMI II flow and very much appears to be the culprit lesion.  This will be a high risk PCI but Dr. Tamala Julian agrees to do this and the patient, who is actively having pain despite the balloon pump, agrees.  DM:  Suprisingly his A1c is only 5.2.  No change in therapy.  Risk reduction:  Start high dose statin.  HTN:  Continue beta blocker.  Hold ACE inhibitor given his CKD IIIA until we see how he does with contrast.

## 2021-01-12 NOTE — Progress Notes (Signed)
Notified Roby Lofts PA of patient's drop in BP with one dose of SL nitro. Patient alert and oriented, but still complaining of 4/10 chest pain. She will be to bedside shortly.

## 2021-01-12 NOTE — Progress Notes (Signed)
Site area: Right groin a Balloon Pump sheath was removed   Site Prior to Removal:  Level 0  Pressure Applied For 30 MINUTES    Bedrest Beginning at [place] hours  Manual:   Yes.    Patient Status During Pull:  stable  Post Pull Groin Site:  Level 0  Post Pull Instructions Given:  Yes.    Post Pull Pulses Present:  Yes.    Dressing Applied:  Yes.    Comments:  Report off to [place]

## 2021-01-12 NOTE — Progress Notes (Addendum)
Patient transferred to special recovery with this RN to wait for carelink for transport due to incoming STEMI. Patient resting comfortably denies chest pain. Right groin sign soft, no bleeding or hematoma noted, Given warm blankets.

## 2021-01-12 NOTE — Progress Notes (Signed)
ANTICOAGULATION CONSULT NOTE - Initial Consult  Pharmacy Consult for heparin Indication: IABP  No Known Allergies  Patient Measurements:   Heparin Dosing Weight: 62kg  Vital Signs: Temp: 99.1 F (37.3 C) (01/13 1130) Temp Source: Oral (01/13 1130) BP: 158/78 (01/13 1330) Pulse Rate: 84 (01/13 1330)  Labs: Recent Labs    01/11/21 1051 01/11/21 1255 01/11/21 1930 01/12/21 0252  HGB 16.0  --   --  14.4  HCT 47.4  --   --  42.2  PLT 273  --   --  217  APTT 28  --   --   --   LABPROT 13.7  --   --   --   INR 1.1  --   --   --   HEPARINUNFRC  --   --  0.25* 0.38  CREATININE 1.62*  --   --  1.54*  TROPONINIHS 1,574* 1,446*  --   --     Estimated Creatinine Clearance: 37.3 mL/min (A) (by C-G formula based on SCr of 1.54 mg/dL (H)).   Medical History: Past Medical History:  Diagnosis Date  . Arthritis    neck and shoulders  . Chronic kidney disease    chronic kidney disease - stage 3, GFR 30-59 ml/min  . Colon cancer ([place])   . ED (erectile dysfunction)   . History of 2019 novel coronavirus disease (COVID-19) 08/23/2020  . Hyperlipidemia   . Hypertension   . Inguinal hernia   . Pre-diabetes      Assessment: 48 yo[facility] with IABP in place to undergo surgical consultation for revascularization. Heparin continues at 900 units/h. TCTS recommending PCI, pt now in cath lab.  Goal of Therapy:  Heparin level 0.2-0.5 units/ml Monitor platelets by anticoagulation protocol: Yes   Plan:  Heparin 900 units/h F/U Vania Rea, PharmD, BCPS, [facility] Clinical Pharmacist [phone] Please check AMION for all [facility] Pharm[facility] numbers 01/12/2021

## 2021-01-12 NOTE — Progress Notes (Signed)
Patient Transported to [facility] via [place]. Stable at time of transport balloon pump in place right groin site no bleeding or hematoma noted.

## 2021-01-12 NOTE — Progress Notes (Signed)
Day of Surgery Procedure(s) (LRB): CORONARY STENT INTERVENTION (N/A) Subjective: Patient examined, images of coronary arteriogram and echocardiogram from [facility] personally reviewed. Patient recently admitted to outside hospital with resting chest pain, ST segment depression, and positive cardiac enzymes consistent with non-STEMI.  He was stabilized and underwent cardiac catheterization today showing diffuse distal coronary disease with high-grade proximal stenosis of the circumflex.  His LV function by echo is preserved with EF 55%. A balloon pump was placed at the time of cath to reduce persistent chest pain. His hemodynamics have been stable.  I was asked to review his coronary arteriograms for coordination of care. The left main has no significant stenosis.  The LAD has good flow proximally with distal disease of a small size vessel.  The dominant RCA also has good proximal flow with diffuse disease of a small vessel.  The circumflex has high-grade proximal stenosis 90% with diffuse distal disease of small vessel size.  The culprit vessel is the circumflex which has high-grade proximal stenosis but is a very poor target for bypass grafting.  I would recommend consideration of PCI as his best therapy. Objective: Vital signs in last 24 hours: Temp:  [97.6 F (36.4 C)-99.1 F (37.3 C)] 99.1 F (37.3 C) (01/13 1130) Pulse Rate:  [53-82] 66 (01/13 1215) Cardiac Rhythm: Normal sinus rhythm (01/13 1130) Resp:  [12-21] 14 (01/13 1215) BP: (89-170)/(54-102) 108/54 (01/13 1215) SpO2:  [93 %-100 %] 99 % (01/13 1215) Arterial Line BP: (90-157)/(19-43) 90/19 (01/13 1215) Weight:  [62.6 kg] 62.6 kg (01/13 0305)  Hemodynamic parameters for last 24 hours:  Sinus rhythm stable blood pressure  Intake/Output from previous day: No intake/output data recorded. Intake/Output this shift: No intake/output data recorded.    Lab Results: Recent Labs    01/11/21 1051 01/12/21 0252   WBC 9.0 7.0  HGB 16.0 14.4  HCT 47.4 42.2  PLT 273 217   BMET:  Recent Labs    01/11/21 1051 01/12/21 0252  NA 135 136  K 3.7 3.9  CL 96* 101  CO2 26 26  GLUCOSE 117* 138*  BUN 33* 30*  CREATININE 1.62* 1.54*  CALCIUM 9.9 9.2    PT/INR:  Recent Labs    01/11/21 1051  LABPROT 13.7  INR 1.1   ABG No results found for: PHART, HCO3, TCO2, ACIDBASEDEF, O2SAT CBG (last 3)  No results for input(s): GLUCAP in the last 72 hours.  Assessment/Plan: S/P Procedure(s) (LRB): CORONARY STENT INTERVENTION (N/A) Non-STEMI with high-grade circumflex stenosis with severe diffuse distal disease making the vessel a poor target for CABG.  Recommend assessment for PCI to help this patient recover from non-STEMI.   LOS: 0 days    Carl Townsend 01/12/2021

## 2021-01-12 NOTE — Consult Note (Signed)
ANTICOAGULATION CONSULT NOTE  Pharmacy Consult for Heparin Infusion Indication: chest pain/ACS  Patient Measurements: Heparin Dosing Weight: 65.8 kg  Labs: Recent Labs    01/11/21 1051 01/11/21 1255 01/11/21 1930 01/12/21 0252  HGB 16.0  --   --  14.4  HCT 47.4  --   --  42.2  PLT 273  --   --  217  APTT 28  --   --   --   LABPROT 13.7  --   --   --   INR 1.1  --   --   --   HEPARINUNFRC  --   --  0.25* 0.38  CREATININE 1.62*  --   --  1.54*  TROPONINIHS 1,574* 1,446*  --   --     Estimated Creatinine Clearance: 37.3 mL/min (A) (by C-G formula based on SCr of 1.54 mg/dL (H)).   Medical History: Past Medical History:  Diagnosis Date  . Arthritis    neck and shoulders  . Chronic kidney disease    chronic kidney disease - stage 3, GFR 30-59 ml/min  . Colon cancer ([place])   . ED (erectile dysfunction)   . History of 2019 novel coronavirus disease (COVID-19) 08/23/2020  . Hyperlipidemia   . Hypertension   . Inguinal hernia   . Pre-diabetes     Medications:  No anticoagulation prior to admission per my chart review  Assessment: Patient is a 75 y/o M with medical history as above who presented to the ED 1/12 with chest pain. Patient seen in ED 1/9 for same complaint. Patient was seen in cardiology office prior to presentation and instructed to come to the ED. Pharmacy consulted to initiate heparin infusion for suspected ACS.  Baseline CBC pending. Was normal when checked 1/9 ED visit. Baseline aPTT and PT-INR pending.   1/12 1130 INITIAL: 4000 unit IV bolus x 1 followed by continuous infusion at 750 units/hr 1/12 1930 HL = 0.25 01/13 0252 HL = 0.38, therapeutic    Goal of Therapy:  Heparin level 0.3-0.7 units/ml Monitor platelets by anticoagulation protocol: Yes   Plan:  --WIll continue infusion rate at 900 units/hr --Will check HL 8 hours to confirm. --Daily CBC per protocol while on heparin infusion  Renda Rolls, PharmD, [facility] 01/12/2021 6:27 AM

## 2021-01-12 NOTE — Progress Notes (Signed)
Bil sheaths out.  Bil groin soft, nontender, Bil doppler pedal pulses.  Patient educated to call me if he has new low back pain, new low belly pain, nauseated, chest pain or  If he feels something wet in his groin area.  Placed new condom cath on patient.  Patient verbalized understanding of instructions.  Informed patient that he would be on 6 hour bed rest (2 a.m.).  Patient would need a nurse the first time he ambulated out of bed.    I would raise his head to 30 degrees 45 mins post sheath removal which would be 2045.

## 2021-01-12 NOTE — CV Procedure (Signed)
Brief cath note Indication non-STEMI unstable angina Abnormal EKG diffuse ST depression  Patient was brought to the cardiac Cath Lab 5 French sheath was placed in the right femoral artery Left system Left main minor disease ectatic LAD ectatic proximally diffuse disease distally moderate to severe 75 to 80% mid Circumflex 99% ostially TIMI I flow IRA diffusely ectatic slow flow RCA large relatively free of disease except distally has diffuse disease PL PDA Minimal luminal collaterals LV function mild to moderately depressed inferior lateral hypo-  Patient having intractable angina on the table Intra-aortic balloon pump was placed at the end of the case to help with relief of angina and ischemia Heparin restarted Case discussed with Dr. Percival Spanish at [facility] to help with transfer emergently Family notified Full Note to follow

## 2021-01-12 NOTE — Plan of Care (Signed)
  Problem: Education: Goal: Understanding of CV disease, CV risk reduction, and recovery process will improve Outcome: Progressing Goal: Individualized Educational Video(s) Outcome: Progressing   Problem: Activity: Goal: Ability to return to baseline activity level will improve Outcome: Progressing   Problem: Cardiovascular: Goal: Vascular access site(s) Level 0-1 will be maintained Outcome: Progressing   Problem: Pain Managment: Goal: General experience of comfort will improve Outcome: Progressing

## 2021-01-12 NOTE — Care Plan (Signed)
This patient was admitted yesterday with chest pain found to have non-STEMI.  Patient underwent left heart cath 1/13.  He is found to have multivessel disease.  He continued to have anginal pain while having Left heart cath.  Patient was emergently transferred to [facility] for CABG. Patient was transferred before I was able to see the patient.  Dr. Clayborn Bigness agreed to do discharge summary.

## 2021-01-12 NOTE — Plan of Care (Signed)
  Problem: Education: Goal: Knowledge of General Education information will improve Description: Including pain rating scale, medication(s)/side effects and non-pharmacologic comfort measures Outcome: Progressing   Problem: Clinical Measurements: Goal: Ability to maintain clinical measurements within normal limits will improve Outcome: Progressing Goal: Will remain free from infection Outcome: Progressing   

## 2021-01-13 ENCOUNTER — Encounter (HOSPITAL_COMMUNITY): Payer: Self-pay | Admitting: Interventional Cardiology

## 2021-01-13 ENCOUNTER — Other Ambulatory Visit: Payer: Self-pay

## 2021-01-13 DIAGNOSIS — E782 Mixed hyperlipidemia: Secondary | ICD-10-CM | POA: Diagnosis not present

## 2021-01-13 DIAGNOSIS — I214 Non-ST elevation (NSTEMI) myocardial infarction: Secondary | ICD-10-CM | POA: Diagnosis not present

## 2021-01-13 LAB — CBC
HCT: 40.3 % (ref 39.0–52.0)
Hemoglobin: 13.9 g/dL (ref 13.0–17.0)
MCH: 34.5 pg — ABNORMAL HIGH (ref 26.0–34.0)
MCHC: 34.5 g/dL (ref 30.0–36.0)
MCV: 100 fL (ref 80.0–100.0)
Platelets: 240 10*3/uL (ref 150–400)
RBC: 4.03 MIL/uL — ABNORMAL LOW (ref 4.22–5.81)
RDW: 13.7 % (ref 11.5–15.5)
WBC: 7 10*3/uL (ref 4.0–10.5)
nRBC: 0 % (ref 0.0–0.2)

## 2021-01-13 LAB — BASIC METABOLIC PANEL
Anion gap: 11 (ref 5–15)
BUN: 20 mg/dL (ref 8–23)
CO2: 21 mmol/L — ABNORMAL LOW (ref 22–32)
Calcium: 8.7 mg/dL — ABNORMAL LOW (ref 8.9–10.3)
Chloride: 104 mmol/L (ref 98–111)
Creatinine, Ser: 1.32 mg/dL — ABNORMAL HIGH (ref 0.61–1.24)
GFR, Estimated: 57 mL/min — ABNORMAL LOW (ref >60)
Glucose, Bld: 92 mg/dL (ref 70–99)
Potassium: 4 mmol/L (ref 3.5–5.1)
Sodium: 136 mmol/L (ref 135–145)

## 2021-01-13 MED ORDER — ENOXAPARIN SODIUM 40 MG/0.4ML ~~LOC~~ SOLN: 40.0000 mg | SUBCUTANEOUS | Status: DC

## 2021-01-13 MED ORDER — TICAGRELOR 90 MG PO TABS: 90.0000 mg | ORAL_TABLET | Freq: Two times a day (BID) | ORAL | 2 refills | Status: DC

## 2021-01-13 MED ORDER — ATORVASTATIN CALCIUM 80 MG PO TABS: 80.0000 mg | ORAL_TABLET | Freq: Every day | ORAL | 2 refills | Status: DC

## 2021-01-13 MED ORDER — NITROGLYCERIN 0.4 MG SL SUBL: 0.4000 mg | SUBLINGUAL_TABLET | SUBLINGUAL | 2 refills | Status: AC | PRN

## 2021-01-13 MED FILL — BRILINTA 90 MG TABLET: 90 | 30 days supply | Qty: 60 | Fill #0

## 2021-01-13 MED FILL — ATORVASTATIN CALCIUM 80 MG: 80 | 30 days supply | Qty: 30 | Fill #0

## 2021-01-13 MED FILL — Verapamil HCl IV Soln 2.5 MG/ML: INTRAVENOUS | Qty: 2 | Status: AC

## 2021-01-13 MED FILL — Nitroglycerin IV Soln 100 MCG/ML in D5W: INTRA_ARTERIAL | Qty: 10 | Status: AC

## 2021-01-13 NOTE — Progress Notes (Signed)
Pt discharged to home with wife.  AVS report reviewed with patient.  All questions answered.  IVs removed.  Education and care plan completed.

## 2021-01-13 NOTE — Progress Notes (Signed)
Progress Note  Patient Name: Carl Townsend Date of Encounter: 01/13/2021  [facility] Cardiologist: No primary care provider on file. Dr, Clayborn Bigness  Subjective   Had some chest pain relieved with SL NTG.  Did well post procedure.   Inpatient Medications    Scheduled Meds: . aspirin  81 mg Oral Daily  . atorvastatin  80 mg Oral QHS  . budesonide  1 mg Nebulization Daily  . Chlorhexidine Gluconate Cloth  6 each Topical Daily  . enoxaparin (LOVENOX) injection  40 mg Subcutaneous Q24H  . sodium chloride flush  3 mL Intravenous Q12H  . ticagrelor  90 mg Oral BID   Continuous Infusions: . sodium chloride     PRN Meds: sodium chloride, acetaminophen, albuterol, nitroGLYCERIN, ondansetron (ZOFRAN) IV, sodium chloride flush   Vital Signs    Vitals:   01/13/21 0800 01/13/21 0858 01/13/21 0900 01/13/21 1000  BP: 94/68  128/63 124/72  Pulse: 84 81 79 72  Resp: 20 18    Temp:      TempSrc:      SpO2: 97% 100% 100% 100%  Weight:        Intake/Output Summary (Last 24 hours) at 01/13/2021 1050 Last data filed at 01/13/2021 0800 Gross per 24 hour  Intake 965.94 ml  Output 850 ml  Net 115.94 ml   Last 3 Weights 01/13/2021 01/12/2021 01/11/2021  Weight (lbs) 136 lb 14.5 oz 137 lb 14.4 oz 145 lb  Weight (kg) 62.1 kg 62.551 kg 65.772 kg      Telemetry    NSR - Personally Reviewed  ECG     NSR, ST depression less prominent- Personally Reviewed  Physical Exam   GEN: No acute distress.   Neck: No JVD Cardiac: RRR, no murmurs, rubs, or gallops.  Respiratory: Clear to auscultation bilaterally. GI: Soft, nontender, non-distended  MS: No edema; No deformity. Palpable pulses bilaterally in the femoral arteries.  No hematomas.   Neuro:  Nonfocal  Psych: Normal affect   Labs    High Sensitivity Troponin:   Recent Labs  Lab 01/08/21 1404 01/08/21 1604 01/11/21 1051 01/11/21 1255  TROPONINIHS 14 35* 1,574* 1,446*      Chemistry Recent Labs  Lab  01/11/21 1051 01/12/21 0252 01/13/21 0109  NA 135 136 136  K 3.7 3.9 4.0  CL 96* 101 104  CO2 26 26 21*  GLUCOSE 117* 138* 92  BUN 33* 30* 20  CREATININE 1.62* 1.54* 1.32*  CALCIUM 9.9 9.2 8.7*  PROT  --  7.0  --   ALBUMIN  --  3.4*  --   AST  --  21  --   ALT  --  13  --   ALKPHOS  --  45  --   BILITOT  --  1.1  --   GFRNONAA 44* 47* 57*  ANIONGAP '13 9 11     ' Hematology Recent Labs  Lab 01/11/21 1051 01/12/21 0252 01/13/21 0109  WBC 9.0 7.0 7.0  RBC 4.85 4.31 4.03*  HGB 16.0 14.4 13.9  HCT 47.4 42.2 40.3  MCV 97.7 97.9 100.0  MCH 33.0 33.4 34.5*  MCHC 33.8 34.1 34.5  RDW 13.7 13.9 13.7  PLT 273 217 240    BNP Recent Labs  Lab 01/11/21 1347  BNP 720.9*     DDimer No results for input(s): DDIMER in the last 168 hours.   Radiology    DG Chest 1 View  Result Date: 01/11/2021 CLINICAL DATA:  Chest pain EXAM: CHEST  1 VIEW COMPARISON:  01/08/2021 FINDINGS: The heart size and mediastinal contours are within normal limits. Slightly hyperinflated lungs. No focal airspace consolidation, pleural effusion, or pneumothorax. The visualized skeletal structures are unremarkable. IMPRESSION: No active disease. Electronically Signed   By: Davina Poke D.O.   On: 01/11/2021 11:20   CARDIAC CATHETERIZATION  Result Date: 01/12/2021  Ost LAD to Prox LAD lesion is 10% stenosed.  Mid LAD to Dist LAD lesion is 75% stenosed.  Ost Cx lesion is 100% stenosed.  Dist RCA lesion is 75% stenosed with 75% stenosed side branch in RPDA.  RPAV lesion is 75% stenosed with 75% stenosed side branch in 2nd RPL.  Preserved overall left ventricular function around 55%  Intra aortic balloon pump placed for stabilization and refractory angina  Anticipate transfer to tertiary care center for high risk complex PCI stent to circumflex  Conclusion Inpatient diagnostic cardiac cath because of refractory angina non-STEMI Left ventricle appears to be normal EF around 55% Coronary Left main was  large but ectatic LAD ectatic proximally but diffusely diseased mid to distal (75% Circumflex 100% ostially with sluggish flow TIMI I IRA RCA large free of disease until PL and PDA diffusely diseased with 75% Intervention deferred Intra-aortic balloon pump placed for refractory angina 3 complex PCI to help with stabilization Transferred patient to [facility] for high risk complex PCI  CARDIAC CATHETERIZATION  Result Date: 01/12/2021  Successful stent implantation in the ostial to proximal circumflex reducing a 99% stenosis with TIMI grade II flow to less than 10% with TIMI grade III flow and complete resolution of what it been ongoing chest pain.  A 4.0 x 15 mm Onyx was placed and postdilated with a 4.0 mm balloon to 14 and 15 atm respectively. RECOMMENDATIONS:  Discontinue IV heparin.  Intra-aortic balloon pump to 1:2 for 1 hour hour then 1:3 for 1 hour and discontinue the balloon pump.  We will also need to remove the left femoral sheath.  Aspirin and Brilinta x6 months.  May switch to aspirin and Plavix after 6 months.  After total of 12 months of DAPT, discontinue aspirin and continue clopidogrel monotherapy.  ECHOCARDIOGRAM COMPLETE  Result Date: 01/11/2021    ECHOCARDIOGRAM REPORT   Patient Name:   Carl Townsend Date of Exam: 01/11/2021 Medical Rec #:  [id number]            Height:       71.0 in Accession #:    [id number]           Weight:       145.0 lb Date of Birth:  07-06-1946            BSA:          1.839 m Patient Age:    75 years             BP:           143/74 mmHg Patient Gender: M                    HR:           50 bpm. Exam Location:  [facility] Procedure: 2D Echo, Cardiac Doppler and Color Doppler Indications:     Acute myocardial infarction -unspecified I21.9  History:         Patient has no prior history of Echocardiogram examinations.                  Risk Factors:Hypertension and Dyslipidemia.  Sonographer:  Sherrie Sport RDCS (AE) Referring Phys:  [id number] DWAYNE D CALLWOOD  Diagnosing Phys: Yolonda Kida MD  Sonographer Comments: Suboptimal parasternal window. IMPRESSIONS  1. Inferior Hypokinesis.  2. Left ventricular ejection fraction, by estimation, is 50 to 55%. The left ventricle has low normal function. The left ventricle demonstrates regional wall motion abnormalities (see scoring diagram/findings for description). Left ventricular diastolic  parameters were normal.  3. Right ventricular systolic function is normal. The right ventricular size is normal.  4. The mitral valve is grossly normal. No evidence of mitral valve regurgitation.  5. The aortic valve is grossly normal. Aortic valve regurgitation is not visualized. FINDINGS  Left Ventricle: Inferior Hypo. Left ventricular ejection fraction, by estimation, is 50 to 55%. The left ventricle has low normal function. The left ventricle demonstrates regional wall motion abnormalities. The left ventricular internal cavity size was  normal in size. There is no left ventricular hypertrophy. Left ventricular diastolic parameters were normal. Right Ventricle: The right ventricular size is normal. No increase in right ventricular wall thickness. Right ventricular systolic function is normal. Left Atrium: Left atrial size was normal in size. Right Atrium: Right atrial size was normal in size. Pericardium: There is no evidence of pericardial effusion. Mitral Valve: The mitral valve is grossly normal. No evidence of mitral valve regurgitation. Tricuspid Valve: The tricuspid valve is normal in structure. Tricuspid valve regurgitation is trivial. Aortic Valve: The aortic valve is grossly normal. Aortic valve regurgitation is not visualized. Aortic valve mean gradient measures 1.0 mmHg. Aortic valve peak gradient measures 1.5 mmHg. Aortic valve area, by VTI measures 3.48 cm. Pulmonic Valve: The pulmonic valve was grossly normal. Pulmonic valve regurgitation is not visualized. Aorta: The aortic arch was not well visualized. IAS/Shunts: No  atrial level shunt detected by color flow Doppler. Additional Comments: Inferior Hypokinesis. There is no pleural effusion.  LEFT VENTRICLE PLAX 2D LVIDd:         3.63 cm  Diastology LVIDs:         2.63 cm  LV e' medial:    4.35 cm/s LV PW:         1.20 cm  LV E/e' medial:  16.9 LV IVS:        1.46 cm  LV e' lateral:   5.55 cm/s LVOT diam:     2.20 cm  LV E/e' lateral: 13.2 LV SV:         45 LV SV Index:   25 LVOT Area:     3.80 cm  RIGHT VENTRICLE RV S prime:     12.80 cm/s TAPSE (M-mode): 3.1 cm LEFT ATRIUM             Index       RIGHT ATRIUM           Index LA diam:        3.00 cm 1.63 cm/m  RA Area:     13.10 cm LA Vol (A2C):   24.6 ml 13.38 ml/m RA Volume:   31.60 ml  17.18 ml/m LA Vol (A4C):   27.7 ml 15.06 ml/m LA Biplane Vol: 26.5 ml 14.41 ml/m  AORTIC VALVE                   PULMONIC VALVE AV Area (Vmax):    3.00 cm    PV Vmax:        0.40 m/s AV Area (Vmean):   2.99 cm    PV Peak grad:   0.6 mmHg AV Area (  VTI):     3.48 cm    RVOT Peak grad: 7 mmHg AV Vmax:           60.30 cm/s AV Vmean:          41.200 cm/s AV VTI:            0.130 m AV Peak Grad:      1.5 mmHg AV Mean Grad:      1.0 mmHg LVOT Vmax:         47.60 cm/s LVOT Vmean:        32.400 cm/s LVOT VTI:          0.119 m LVOT/AV VTI ratio: 0.92  AORTA Ao Root diam: 3.73 cm MITRAL VALVE               TRICUSPID VALVE MV Area (PHT): 6.71 cm    TR Peak grad:   19.5 mmHg MV Decel Time: 113 msec    TR Vmax:        221.00 cm/s MV E velocity: 73.30 cm/s MV A velocity: 45.00 cm/s  SHUNTS MV E/A ratio:  1.63        Systemic VTI:  0.12 m                            Systemic Diam: 2.20 cm Dwayne Prince Rome MD Electronically signed by Yolonda Kida MD Signature Date/Time: 01/11/2021/6:10:24 PM    Final     Cardiac Studies   Cath films personally reviewed  Patient Profile     75 y.o. male with NSTEMI  Assessment & Plan    Doing well post PCI.  COntinue DAPT with aggressive secondary prevention.  SL NTG Rx needed as well.    Hyperlipidemia: Continue high dose statin.   For questions or updates, please contact [place] Please consult [email or web address] for contact info under        Signed, Larae Grooms, MD  01/13/2021, 10:50 AM

## 2021-01-13 NOTE — Plan of Care (Signed)
  Problem: Education: Goal: Understanding of CV disease, CV risk reduction, and recovery process will improve 01/13/2021 1125 by Osborne Oman, RN Outcome: Adequate for Discharge 01/13/2021 1124 by Osborne Oman, RN Outcome: Adequate for Discharge Goal: Individualized Educational Video(s) 01/13/2021 1125 by Osborne Oman, RN Outcome: Adequate for Discharge 01/13/2021 1124 by Osborne Oman, RN Outcome: Adequate for Discharge   Problem: Activity: Goal: Ability to return to baseline activity level will improve 01/13/2021 1125 by Osborne Oman, RN Outcome: Adequate for Discharge 01/13/2021 1124 by Osborne Oman, RN Outcome: Adequate for Discharge   Problem: Cardiovascular: Goal: Vascular access site(s) Level 0-1 will be maintained 01/13/2021 1125 by Osborne Oman, RN Outcome: Adequate for Discharge 01/13/2021 1124 by Osborne Oman, RN Outcome: Adequate for Discharge   Problem: Pain Managment: Goal: General experience of comfort will improve 01/13/2021 1125 by Osborne Oman, RN Outcome: Adequate for Discharge 01/13/2021 1124 by Osborne Oman, RN Outcome: Adequate for Discharge

## 2021-01-13 NOTE — Progress Notes (Signed)
CARDIAC REHAB PHASE I   PRE:  Rate/Rhythm: 68 SR    BP: sitting 128/77    SaO2:   MODE:  Ambulation: 290 ft   POST:  Rate/Rhythm: 82 SR    BP: sitting 134/76     SaO2:   Pt getting dressed on my arrival. Denies CP. Ambulated hall at very slow pace, no c/o. Discussed MI, stent, Brilinta, restrictions, diet, exercise, NTG use, and CRPII with pt. Receptive, understands importance of Brilinta. Will refer to [place].  [phone]   [place], ACSM 01/13/2021 12:08 PM

## 2021-01-13 NOTE — Plan of Care (Signed)
  Problem: Education: Goal: Understanding of CV disease, CV risk reduction, and recovery process will improve Outcome: Adequate for Discharge Goal: Individualized Educational Video(s) Outcome: Adequate for Discharge   Problem: Activity: Goal: Ability to return to baseline activity level will improve Outcome: Adequate for Discharge   Problem: Cardiovascular: Goal: Vascular access site(s) Level 0-1 will be maintained Outcome: Adequate for Discharge   Problem: Pain Managment: Goal: General experience of comfort will improve Outcome: Adequate for Discharge

## 2021-01-13 NOTE — Progress Notes (Signed)
Patient states he is having "bad" Chest Pressure mid sternal.  .  Pain is not sharp and doesn't travel. BP 144/76.  Administered one nitro 0.4mg  sublingual and EKG

## 2021-01-13 NOTE — Progress Notes (Signed)
Paged cardiology. Patient's chest pressure has eased with the nitro.  BP 122/82

## 2021-01-13 NOTE — Discharge Summary (Addendum)
Discharge Summary    Patient ID: CHASTIN RIESGO MRN: [id number]; DOB: September 06, 1946  Admit date: 01/12/2021 Discharge date: 01/13/2021  Primary Care Provider: [name], MD  Primary Cardiologist: Dr. Clayborn Bigness  Discharge Diagnoses    Active Problems:   NSTEMI (non-ST elevated myocardial infarction) [facility])   CAD   HTN   HLD  Diagnostic Studies/Procedures    CORONARY STENT INTERVENTION by Dr. Tamala Julian 01/12/21  Intravascular Ultrasound/IVUS    Conclusion   Successful stent implantation in the ostial to proximal circumflex reducing a 99% stenosis with TIMI grade II flow to less than 10% with TIMI grade III flow and complete resolution of what it been ongoing chest pain.  A 4.0 x 15 mm Onyx was placed and postdilated with a 4.0 mm balloon to 14 and 15 atm respectively.  RECOMMENDATIONS:   Discontinue IV heparin.  Intra-aortic balloon pump to 1:2 for 1 hour hour then 1:3 for 1 hour and discontinue the balloon pump.  We will also need to remove the left femoral sheath.  Aspirin and Brilinta x6 months.  May switch to aspirin and Plavix after 6 months.  After total of 12 months of DAPT, discontinue aspirin and continue clopidogrel monotherapy.  Diagnostic Dominance: Right    Intervention        IABP Insertion  By Dr. Clayborn Bigness 01/12/2021  LEFT HEART CATH AND CORONARY ANGIOGRAPHY possible PCI and stent    Conclusion    Ost LAD to Prox LAD lesion is 10% stenosed.  Mid LAD to Dist LAD lesion is 75% stenosed.  Ost Cx lesion is 100% stenosed.  Dist RCA lesion is 75% stenosed with 75% stenosed side branch in RPDA.  RPAV lesion is 75% stenosed with 75% stenosed side branch in 2nd RPL.  Preserved overall left ventricular function around 55%  Intra aortic balloon pump placed for stabilization and refractory angina  Anticipate transfer to tertiary care center for high risk complex PCI stent to circumflex   Conclusion Inpatient diagnostic cardiac cath  because of refractory angina non-STEMI Left ventricle appears to be normal EF around 55% Coronary Left main was large but ectatic LAD ectatic proximally but diffusely diseased mid to distal (75% Circumflex 100% ostially with sluggish flow TIMI I IRA RCA large free of disease until PL and PDA diffusely diseased with 75% Intervention deferred Intra-aortic balloon pump placed for refractory angina 3 complex PCI to help with stabilization Transferred patient to [facility] for high risk complex PCI  Echo 01/11/21 1. Inferior Hypokinesis.  2. Left ventricular ejection fraction, by estimation, is 50 to 55%. The  left ventricle has low normal function. The left ventricle demonstrates  regional wall motion abnormalities (see scoring diagram/findings for  description). Left ventricular diastolic  parameters were normal.  3. Right ventricular systolic function is normal. The right ventricular  size is normal.  4. The mitral valve is grossly normal. No evidence of mitral valve  regurgitation.  5. The aortic valve is grossly normal. Aortic valve regurgitation is not  visualized.   History of Present Illness     Carl Townsend is a 75 y.o. male with PMH of HTN, CKD, and remote tobacco abuse, who presented to [facility] with recurrent chest pain, transferred to [facility] emergently following LHC with balloon pump.  Carl Townsend was in his usual state of health until ~1 week ago when he began experiencing intermittent chest pain. He reported initial episode of chest pain occurred 01/04/21 with 10/10 chest pressure at rest lasting 20-30 minutes before resolving  spontaneously. He continued to have intermittent episodes prompting him to present to the ED 01/08/21 where he reported chest tightness and SOB. BP was significantly elevated to 184/98 at that time with non-ischemic EKG, and HsTrop 14>35. CXR did suggest COPD and he was started prednisone and an albuterol inhaler. His case was discussed with  cardiology at that time and was felt his symptoms/work-up were not c/w ACS and outpatient follow-up was recommended. He continued to have chest pain following this ED visit. He was seen by Dr. Clayborn Bigness outpatient 01/11/21 and was recommended to present back to the ED for further evaluation.   He was admitted to medicine at [facility]. HsTrop elevated to 1574 and trended down to 1446. BNP 720. EKG with significant baseline wander, though obvious STD in inferolateral leads identified. Dr. Clayborn Bigness with cardiology consulted. Echocardiogram showed EF 50-55%, normal LV diastolic function, +RWMA, normal RV function/size, and no significant valvular abnormalities. He was taken to the cath lab  where he was found to have minor LM disease, 75-80%, pLAD stenosis, 99% LCx stenosis, and diffuse distal PL PDA disease, with inferolateral hypokinesis. An intra-aortic balloon pump placed at the end of the case and patient was transferred emergently to [facility] for further management. Case was discussed with TCTS, Dr. Prescott Gum who felt patient had poor targets for CABG and would be better suited for high-risk PCI. Dr. Tamala Julian evaluated the patient and plans to take to the cath lab in the afternoon.   He has a significant family history of CAD with father passing from [place], brother passing from a 54rd MI in his 57s, and younger brother with multiple stents first placed in his 59s. Also with significant tobacco use history - smoked for 40 years.    Hospital Course     Consultants: None  Patient underwent successful stent implantation in the ostial to proximal circumflex reducing a 99% stenosis with TIMI grade II flow to less than 10% with TIMI grade III flow and complete resolution of what it been ongoing chest pain. However had recurrent pain early morning which resolved with SL nitro. He has been ambulating in room without recurrent chest discomfort. He will walk with cardiac rehab prior to discharge.   Recommended aspirin and  Brilinta x6 months.  May switch to aspirin and Plavix after 6 months.  After total of 12 months of DAPT, discontinue aspirin and continue clopidogrel monotherapy.  Resume home atenolol (can change to Coreg or metoprolol as outpatient) and Candesartan.   Added high intensity statin. Consider Lipid panel and LFTS in 6 weeks.   The patient been seen by Dr. Irish Lack  today and deemed ready for discharge home. All follow-up appointments have been scheduled. Discharge medications are listed below.    Did the patient have an acute coronary syndrome (MI, NSTEMI, STEMI, etc) this admission?:  Yes                               AHA/ACC Clinical Performance & Quality Measures: 4. Aspirin prescribed? - Yes 5. ADP Receptor Inhibitor (Plavix/Clopidogrel, Brilinta/Ticagrelor or Effient/Prasugrel) prescribed (includes medically managed patients)? - Yes 6. Beta Blocker prescribed? - Yes 7. High Intensity Statin (Lipitor 40-63m or Crestor 20-452m prescribed? - Yes 8. EF assessed during THIS hospitalization? - Yes 9. For EF <40%, was ACEI/ARB prescribed? - Yes 10. For EF <40%, Aldosterone Antagonist (Spironolactone or Eplerenone) prescribed? - Not Applicable (EF >/= 4008%11. Cardiac Rehab Phase II ordered (including  medically managed patients)? - Yes  Discharge Vitals Blood pressure 124/72, pulse 72, temperature 98.6 F (37 C), temperature source Oral, resp. rate 18, weight 62.1 kg, SpO2 100 %.  Filed Weights   01/13/21 0500  Weight: 62.1 kg    Labs & Radiologic Studies    CBC Recent Labs    01/12/21 0252 01/13/21 0109  WBC 7.0 7.0  HGB 14.4 13.9  HCT 42.2 40.3  MCV 97.9 100.0  PLT 217 347   Basic Metabolic Panel Recent Labs    01/12/21 0252 01/13/21 0109  NA 136 136  K 3.9 4.0  CL 101 104  CO2 26 21*  GLUCOSE 138* 92  BUN 30* 20  CREATININE 1.54* 1.32*  CALCIUM 9.2 8.7*   Liver Function Tests Recent Labs    01/12/21 0252  AST 21  ALT 13  ALKPHOS 45  BILITOT 1.1  PROT  7.0  ALBUMIN 3.4*    High Sensitivity Troponin:   Recent Labs  Lab 01/08/21 1404 01/08/21 1604 01/11/21 1051 01/11/21 1255  TROPONINIHS 14 35* 1,574* 1,446*    Hemoglobin A1C Recent Labs    01/11/21 1425  HGBA1C 5.2   Fasting Lipid Panel Recent Labs    01/12/21 0252  CHOL 147  HDL 45  LDLCALC 87  TRIG 77  CHOLHDL 3.3    DG Chest 1 View  Result Date: 01/11/2021 CLINICAL DATA:  Chest pain EXAM: CHEST  1 VIEW COMPARISON:  01/08/2021 FINDINGS: The heart size and mediastinal contours are within normal limits. Slightly hyperinflated lungs. No focal airspace consolidation, pleural effusion, or pneumothorax. The visualized skeletal structures are unremarkable. IMPRESSION: No active disease. Electronically Signed   By: Davina Poke D.O.   On: 01/11/2021 11:20   DG Chest 2 View  Result Date: 01/08/2021 CLINICAL DATA:  Chest pain.  Shortness of breath. EXAM: CHEST - 2 VIEW COMPARISON:  September 14, 2011 FINDINGS: Flattening of the hemidiaphragms the lateral view. The heart, hila, mediastinum, lungs, and pleura are otherwise unremarkable. 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 No acute abnormalities. Electronically Signed   By: Dorise Bullion III M.D   On: 01/08/2021 14:36   CARDIAC CATHETERIZATION  Result Date: 01/12/2021  Ost LAD to Prox LAD lesion is 10% stenosed.  Mid LAD to Dist LAD lesion is 75% stenosed.  Ost Cx lesion is 100% stenosed.  Dist RCA lesion is 75% stenosed with 75% stenosed side branch in RPDA.  RPAV lesion is 75% stenosed with 75% stenosed side branch in 2nd RPL.  Preserved overall left ventricular function around 55%  Intra aortic balloon pump placed for stabilization and refractory angina  Anticipate transfer to tertiary care center for high risk complex PCI stent to circumflex  Conclusion Inpatient diagnostic cardiac cath because of refractory angina non-STEMI Left ventricle appears to be normal EF around 55% Coronary  Left main was large but ectatic LAD ectatic proximally but diffusely diseased mid to distal (75% Circumflex 100% ostially with sluggish flow TIMI I IRA RCA large free of disease until PL and PDA diffusely diseased with 75% Intervention deferred Intra-aortic balloon pump placed for refractory angina 3 complex PCI to help with stabilization Transferred patient to [facility] for high risk complex PCI  CARDIAC CATHETERIZATION  Result Date: 01/12/2021  Successful stent implantation in the ostial to proximal circumflex reducing a 99% stenosis with TIMI grade II flow to less than 10% with TIMI grade III flow and complete resolution of what it been ongoing chest pain.  A 4.0 x 15 mm Onyx was placed and postdilated with a 4.0 mm balloon to 14 and 15 atm respectively. RECOMMENDATIONS:  Discontinue IV heparin.  Intra-aortic balloon pump to 1:2 for 1 hour hour then 1:3 for 1 hour and discontinue the balloon pump.  We will also need to remove the left femoral sheath.  Aspirin and Brilinta x6 months.  May switch to aspirin and Plavix after 6 months.  After total of 12 months of DAPT, discontinue aspirin and continue clopidogrel monotherapy.  ECHOCARDIOGRAM COMPLETE  Result Date: 01/11/2021    ECHOCARDIOGRAM REPORT   Patient Name:   Carl Townsend Date of Exam: 01/11/2021 Medical Rec #:  [id number]            Height:       71.0 in Accession #:    [id number]           Weight:       145.0 lb Date of Birth:  November 29, 1946            BSA:          1.839 m Patient Age:    43 years             BP:           143/74 mmHg Patient Gender: M                    HR:           50 bpm. Exam Location:  [facility] Procedure: 2D Echo, Cardiac Doppler and Color Doppler Indications:     Acute myocardial infarction -unspecified I21.9  History:         Patient has no prior history of Echocardiogram examinations.                  Risk Factors:Hypertension and Dyslipidemia.  Sonographer:     Sherrie Sport RDCS (AE) Referring Phys:  [id number] [facility]  [place] Diagnosing Phys: Yolonda Kida MD  Sonographer Comments: Suboptimal parasternal window. IMPRESSIONS  1. Inferior Hypokinesis.  2. Left ventricular ejection fraction, by estimation, is 50 to 55%. The left ventricle has low normal function. The left ventricle demonstrates regional wall motion abnormalities (see scoring diagram/findings for description). Left ventricular diastolic  parameters were normal.  3. Right ventricular systolic function is normal. The right ventricular size is normal.  4. The mitral valve is grossly normal. No evidence of mitral valve regurgitation.  5. The aortic valve is grossly normal. Aortic valve regurgitation is not visualized. FINDINGS  Left Ventricle: Inferior Hypo. Left ventricular ejection fraction, by estimation, is 50 to 55%. The left ventricle has low normal function. The left ventricle demonstrates regional wall motion abnormalities. The left ventricular internal cavity size was  normal in size. There is no left ventricular hypertrophy. Left ventricular diastolic parameters were normal. Right Ventricle: The right ventricular size is normal. No increase in right ventricular wall thickness. Right ventricular systolic function is normal. Left Atrium: Left atrial size was normal in size. Right Atrium: Right atrial size was normal in size. Pericardium: There is no evidence of pericardial effusion. Mitral Valve: The mitral valve is grossly normal. No evidence of mitral valve regurgitation. Tricuspid Valve: The tricuspid valve is normal in structure. Tricuspid valve regurgitation is trivial. Aortic Valve: The aortic valve is grossly normal. Aortic valve regurgitation is not visualized. Aortic valve mean gradient measures 1.0 mmHg. Aortic valve peak gradient measures 1.5 mmHg. Aortic valve area, by VTI measures 3.48 cm. Pulmonic  Valve: The pulmonic valve was grossly normal. Pulmonic valve regurgitation is not visualized. Aorta: The aortic arch was not well visualized.  IAS/Shunts: No atrial level shunt detected by color flow Doppler. Additional Comments: Inferior Hypokinesis. There is no pleural effusion.  LEFT VENTRICLE PLAX 2D LVIDd:         3.63 cm  Diastology LVIDs:         2.63 cm  LV e' medial:    4.35 cm/s LV PW:         1.20 cm  LV E/e' medial:  16.9 LV IVS:        1.46 cm  LV e' lateral:   5.55 cm/s LVOT diam:     2.20 cm  LV E/e' lateral: 13.2 LV SV:         45 LV SV Index:   25 LVOT Area:     3.80 cm  RIGHT VENTRICLE RV S prime:     12.80 cm/s TAPSE (M-mode): 3.1 cm LEFT ATRIUM             Index       RIGHT ATRIUM           Index LA diam:        3.00 cm 1.63 cm/m  RA Area:     13.10 cm LA Vol (A2C):   24.6 ml 13.38 ml/m RA Volume:   31.60 ml  17.18 ml/m LA Vol (A4C):   27.7 ml 15.06 ml/m LA Biplane Vol: 26.5 ml 14.41 ml/m  AORTIC VALVE                   PULMONIC VALVE AV Area (Vmax):    3.00 cm    PV Vmax:        0.40 m/s AV Area (Vmean):   2.99 cm    PV Peak grad:   0.6 mmHg AV Area (VTI):     3.48 cm    RVOT Peak grad: 7 mmHg AV Vmax:           60.30 cm/s AV Vmean:          41.200 cm/s AV VTI:            0.130 m AV Peak Grad:      1.5 mmHg AV Mean Grad:      1.0 mmHg LVOT Vmax:         47.60 cm/s LVOT Vmean:        32.400 cm/s LVOT VTI:          0.119 m LVOT/AV VTI ratio: 0.92  AORTA Ao Root diam: 3.73 cm MITRAL VALVE               TRICUSPID VALVE MV Area (PHT): 6.71 cm    TR Peak grad:   19.5 mmHg MV Decel Time: 113 msec    TR Vmax:        221.00 cm/s MV E velocity: 73.30 cm/s MV A velocity: 45.00 cm/s  SHUNTS MV E/A ratio:  1.63        Systemic VTI:  0.12 m                            Systemic Diam: 2.20 cm Yolonda Kida MD Electronically signed by Yolonda Kida MD Signature Date/Time: 01/11/2021/6:10:24 PM    Final    Disposition   Pt is being discharged home today in good condition.  Follow-up Plans & Appointments  Follow-up Information    Lujean Amel D, MD. Schedule an appointment as soon as possible for a visit in 1 week(s).    Specialties: Cardiology, Internal Medicine Why: for hospital follow up Contact information: [place] [address] [phone]              Discharge Instructions    Diet - low sodium heart healthy   Complete by: As directed    Discharge instructions   Complete by: As directed    NO HEAVY LIFTING (>10lbs) X 2 WEEKS. NO SEXUAL ACTIVITY X 2 WEEKS. NO DRIVING X 1 WEEK. NO SOAKING BATHS, HOT TUBS, POOLS, ETC., X 7 DAYS.   Increase activity slowly   Complete by: As directed       Discharge Medications   Allergies as of 01/13/2021   No Known Allergies     Medication List    STOP taking these medications   predniSONE 50 MG tablet Commonly known as: DELTASONE     TAKE these medications   albuterol 108 (90 Base) MCG/ACT inhaler Commonly known as: VENTOLIN HFA Inhale 2 puffs into the lungs every 4 (four) hours as needed for wheezing or shortness of breath.   ascorbic acid 500 MG tablet Commonly known as: VITAMIN C Take 500 mg by mouth daily.   aspirin 81 MG tablet Take 81 mg by mouth daily.   atenolol 100 MG tablet Commonly known as: TENORMIN Take 100 mg by mouth at bedtime.   atorvastatin 80 MG tablet Commonly known as: LIPITOR Take 1 tablet (80 mg total) by mouth at bedtime.   budesonide 1 MG/2ML nebulizer solution Commonly known as: PULMICORT Inhale 2 mLs into the lungs daily.   candesartan 32 MG tablet Commonly known as: ATACAND Take 32 mg by mouth daily.   cyanocobalamin 1000 MCG tablet Take 1,000 mcg by mouth daily.   ergocalciferol 1.25 MG (50000 UT) capsule Commonly known as: VITAMIN D2 Take 50,000 Units by mouth once a week.   multivitamin tablet Take 1 tablet by mouth daily.   nitroGLYCERIN 0.3 MG SL tablet Commonly known as: NITROSTAT Place 0.3 mg under the tongue every 5 (five) minutes as needed for chest pain.   REFRESH DRY EYE THERAPY OP Place 1 drop into both eyes daily as needed (dry eye).   Refresh  Liquigel 1 % Gel Generic drug: Carboxymethylcellulose Sodium Place 1 drop into both eyes at bedtime.   sildenafil 100 MG tablet Commonly known as: VIAGRA Take 100 mg by mouth daily as needed for erectile dysfunction.   ticagrelor 90 MG Tabs tablet Commonly known as: BRILINTA Take 1 tablet (90 mg total) by mouth 2 (two) times daily.   zinc gluconate 50 MG tablet Take 50 mg by mouth daily.          Outstanding Labs/Studies   Consider OP f/u labs 6-8 weeks given statin initiation this admission.  Duration of Discharge Encounter   Greater than 30 minutes including physician time.  SignedLeanor Kail, PA 01/13/2021, 11:00 AM  I have examined the patient and reviewed assessment and plan and discussed with patient.  Agree with above as stated.  Groins stable.    Doing well post PCI.  COntinue DAPT with aggressive secondary prevention.  SL NTG Rx needed as well.  OK to discharge if he does well walking with cardiac rehab.   Hyperlipidemia: Continue high dose statin.   Cardiac rehab will be beneficial. Healthy diet as well.    Larae Grooms

## 2021-01-13 NOTE — Progress Notes (Signed)
Patient ambulated 10 feet to the bathroom.  Patient denies CP, chest pressure, denies new back pain or belly pain.  bil groin sites benign. Patient states he feels better after walking.

## 2021-01-19 NOTE — Discharge Summary (Signed)
Physician Discharge Summary      Patient ID: Carl Townsend MRN: [id number] DOB/AGE: February 02, 1946 75 y.o.  Admit date: 01/11/2021 Discharge date: 01/19/2021  Primary Discharge Diagnosis NSTEMI Secondary Discharge Diagnosis ACS/CAD  Significant Diagnostic Studies: Status post diagnostic cardiac cath which showed high level stenosis of the ostium of the circumflex with ectasia requiring complex intervention high risk versus coronary bypass surgery so recommendation was to transfer the patient to [place]: cardiology  Hospital Course: Patient initially presented to the emergency room a few days previously and had a negative work-up including negative troponins EKG was nondiagnostic but had some diffuse ST segment depression the patient return to cardiology office with recurrent significant unstable anginal symptoms so he was directed immediately to the emergency room patient was admitted troponins were 1500 EKG had diffuse nonspecific ST depression suggestive of ischemia.  Patient then underwent diagnostic cardiac cath which showed ectatic left main and LAD proximally and a completely occluded ostial circumflex with TIMI 0 flow it was such a large vessel supplying such a large territory and the patient was having ongoing ischemia aortic balloon pump was placed and the patient was then transferred to [facility] for emergent high risk complex intervention versus consideration for coronary bypass surgery.  Dr. Percival Spanish excepted the patient to have Dr. Pernell Dupre perform intervention.  [facility] was gracious enough to come and pick the patient up and have him transported to [facility] with intra-aortic balloon pump in place on heparin nitrate patient had significant improvement in symptoms after balloon pump was placed   Discharge Exam: Blood pressure (!) 163/67, pulse 79, temperature 97.6 F (36.4 C), temperature source Oral, resp. rate 20, height 5\' 11"  (1.803 m), weight 62.6 kg,  SpO2 100 %.   General appearance: alert and appears stated age Resp: clear to auscultation bilaterally Chest wall: no tenderness Cardio: regular rate and rhythm, S1, S2 normal, no murmur, click, rub or gallop GI: soft, non-tender; bowel sounds normal; no masses,  no organomegaly Extremities: extremities normal, atraumatic, no cyanosis or edema Pulses: 2+ and symmetric Neurologic: Alert and oriented X 3, normal strength and tone. Normal symmetric reflexes. Normal coordination and gait Labs:   Lab Results  Component Value Date   WBC 7.0 01/13/2021   HGB 13.9 01/13/2021   HCT 40.3 01/13/2021   MCV 100.0 01/13/2021   PLT 240 01/13/2021    Recent Labs  Lab 01/13/21 0109  NA 136  K 4.0  CL 104  CO2 21*  BUN 20  CREATININE 1.32*  CALCIUM 8.7*  GLUCOSE 92      Radiology: Benign chest x-ray EKG: Diffuse ST segment depression sinus rhythm LVH changes consistent with possible ischemia rate of 80  FOLLOW UP PLANS AND APPOINTMENTS  Allergies as of 01/12/2021   No Known Allergies     Medication List    ASK your doctor about these medications   albuterol 108 (90 Base) MCG/ACT inhaler Commonly known as: VENTOLIN HFA Inhale 2 puffs into the lungs every 4 (four) hours as needed for wheezing or shortness of breath.   ascorbic acid 500 MG tablet Commonly known as: VITAMIN C Take 500 mg by mouth daily.   aspirin 81 MG tablet Take 81 mg by mouth daily.   atenolol 100 MG tablet Commonly known as: TENORMIN Take 100 mg by mouth at bedtime.   budesonide 1 MG/2ML nebulizer solution Commonly known as: PULMICORT Inhale 2 mLs into the lungs daily.   candesartan 32 MG tablet  Commonly known as: ATACAND Take 32 mg by mouth daily. Ask about: Which instructions should I use?   cyanocobalamin 1000 MCG tablet Take 1,000 mcg by mouth daily.   ergocalciferol 1.25 MG (50000 UT) capsule Commonly known as: VITAMIN D2 Take 50,000 Units by mouth once a week.   multivitamin tablet Take  1 tablet by mouth daily.   REFRESH DRY EYE THERAPY OP Place 1 drop into both eyes daily as needed (dry eye).   Refresh Liquigel 1 % Gel Generic drug: Carboxymethylcellulose Sodium Place 1 drop into both eyes at bedtime.   sildenafil 100 MG tablet Commonly known as: VIAGRA Take 100 mg by mouth daily as needed for erectile dysfunction.   zinc gluconate 50 MG tablet Take 50 mg by mouth daily.        BRING ALL MEDICATIONS WITH YOU TO FOLLOW UP APPOINTMENTS  Time spent with patient to include physician time: 60 minutes Signed:  Yolonda Kida MD 01/19/2021, 6:12 PM

## 2021-01-23 DIAGNOSIS — I2510 Atherosclerotic heart disease of native coronary artery without angina pectoris: Secondary | ICD-10-CM | POA: Insufficient documentation

## 2021-02-08 ENCOUNTER — Other Ambulatory Visit (INDEPENDENT_AMBULATORY_CARE_PROVIDER_SITE_OTHER): Payer: Self-pay | Admitting: Vascular Surgery

## 2021-02-08 DIAGNOSIS — R6889 Other general symptoms and signs: Secondary | ICD-10-CM

## 2021-02-09 ENCOUNTER — Other Ambulatory Visit: Payer: Self-pay

## 2021-02-09 ENCOUNTER — Encounter (INDEPENDENT_AMBULATORY_CARE_PROVIDER_SITE_OTHER): Payer: Self-pay | Admitting: Vascular Surgery

## 2021-02-09 ENCOUNTER — Ambulatory Visit (INDEPENDENT_AMBULATORY_CARE_PROVIDER_SITE_OTHER): Payer: Medicare Other | Admitting: Vascular Surgery

## 2021-02-09 ENCOUNTER — Ambulatory Visit (INDEPENDENT_AMBULATORY_CARE_PROVIDER_SITE_OTHER): Payer: Medicare Other

## 2021-02-09 VITALS — BP 150/78 | HR 83 | Resp 16 | Ht 71.0 in | Wt 138.0 lb

## 2021-02-09 DIAGNOSIS — F4312 Post-traumatic stress disorder, chronic: Secondary | ICD-10-CM | POA: Insufficient documentation

## 2021-02-09 DIAGNOSIS — F515 Nightmare disorder: Secondary | ICD-10-CM | POA: Insufficient documentation

## 2021-02-09 DIAGNOSIS — C189 Malignant neoplasm of colon, unspecified: Secondary | ICD-10-CM | POA: Insufficient documentation

## 2021-02-09 DIAGNOSIS — E782 Mixed hyperlipidemia: Secondary | ICD-10-CM

## 2021-02-09 DIAGNOSIS — I2510 Atherosclerotic heart disease of native coronary artery without angina pectoris: Secondary | ICD-10-CM | POA: Diagnosis not present

## 2021-02-09 DIAGNOSIS — R6889 Other general symptoms and signs: Secondary | ICD-10-CM

## 2021-02-09 DIAGNOSIS — I70213 Atherosclerosis of native arteries of extremities with intermittent claudication, bilateral legs: Secondary | ICD-10-CM

## 2021-02-09 DIAGNOSIS — N529 Male erectile dysfunction, unspecified: Secondary | ICD-10-CM | POA: Insufficient documentation

## 2021-02-09 DIAGNOSIS — I10 Essential (primary) hypertension: Secondary | ICD-10-CM | POA: Diagnosis not present

## 2021-02-10 ENCOUNTER — Encounter: Payer: Medicare Other | Attending: Internal Medicine | Admitting: *Deleted

## 2021-02-10 DIAGNOSIS — I252 Old myocardial infarction: Secondary | ICD-10-CM | POA: Insufficient documentation

## 2021-02-10 DIAGNOSIS — I214 Non-ST elevation (NSTEMI) myocardial infarction: Secondary | ICD-10-CM

## 2021-02-10 DIAGNOSIS — Z955 Presence of coronary angioplasty implant and graft: Secondary | ICD-10-CM | POA: Insufficient documentation

## 2021-02-10 NOTE — Progress Notes (Signed)
Initial telephone orientation completed. Diagnosis can be found in [facility] 1/13. EP orientation scheduled for Monday 2/14 at 1pm

## 2021-02-12 ENCOUNTER — Encounter (INDEPENDENT_AMBULATORY_CARE_PROVIDER_SITE_OTHER): Payer: Self-pay | Admitting: Vascular Surgery

## 2021-02-12 DIAGNOSIS — I70219 Atherosclerosis of native arteries of extremities with intermittent claudication, unspecified extremity: Secondary | ICD-10-CM | POA: Insufficient documentation

## 2021-02-12 DIAGNOSIS — E785 Hyperlipidemia, unspecified: Secondary | ICD-10-CM | POA: Insufficient documentation

## 2021-02-12 NOTE — Progress Notes (Signed)
MRN : [id number]  Carl Townsend is a 75 y.o. (January 21, 1946) male who presents with chief complaint of  Chief Complaint  Patient presents with  . New Patient (Initial Visit)    Ref Hande abnormal right ABI,right ABI non-compressible  .  History of Present Illness:    The patient is seen for evaluation of painful lower extremities and diminished pulses. Patient notes the pain is always associated with activity and is very consistent day today. Typically, the pain occurs at less than one block, progress is as activity continues to the point that the patient must stop walking. Resting including standing still for several minutes allowed resumption of the activity and the ability to walk a similar distance before stopping again. Uneven terrain and inclined shorten the distance. The pain has been progressive over the past several years. The patient states the inability to walk is not having a profound negative impact on quality of life and daily activities.  The patient denies rest pain or dangling of an extremity off the side of the bed during the night for relief. No open wounds or sores at this time. No prior interventions or surgeries.  No history of back problems or DJD of the lumbar sacral spine.   The patient denies changes in claudication symptoms or new rest pain symptoms.  No new ulcers or wounds of the foot.  The patient's blood pressure has been stable and relatively well controlled. The patient denies amaurosis fugax or recent TIA symptoms. There are no recent neurological changes noted. The patient denies history of DVT, PE or superficial thrombophlebitis. The patient denies recent episodes of angina or shortness of breath.   Current Meds  Medication Sig  . albuterol (VENTOLIN HFA) 108 (90 Base) MCG/ACT inhaler Inhale 2 puffs into the lungs every 4 (four) hours as needed for wheezing or shortness of breath.  [name] Kitchen ascorbic acid (VITAMIN C) 500 MG tablet Take 500 mg by mouth  daily.  [name] Kitchen aspirin 81 MG tablet Take 81 mg by mouth daily.  [name] Kitchen atenolol (TENORMIN) 50 MG tablet Take 50 mg by mouth at bedtime.  [name] Kitchen atorvastatin (LIPITOR) 80 MG tablet Take 1 tablet (80 mg total) by mouth at bedtime.  . budesonide (PULMICORT) 1 MG/2ML nebulizer solution Inhale 2 mLs into the lungs daily.  . candesartan (ATACAND) 16 MG tablet Take 16 mg by mouth daily.  . Carboxymethylcellulose Sodium (REFRESH LIQUIGEL) 1 % GEL Place 1 drop into both eyes at bedtime.   . cyanocobalamin 1000 MCG tablet Take 1,000 mcg by mouth daily.  . ergocalciferol (VITAMIN D2) 1.25 MG (50000 UT) capsule Take 50,000 Units by mouth once a week. (Patient not taking: Reported on 02/10/2021)  . Glycerin-Polysorbate 80 (REFRESH DRY EYE THERAPY OP) Place 1 drop into both eyes daily as needed (dry eye). (Patient not taking: Reported on 02/10/2021)  . Multiple Vitamin (MULTIVITAMIN) tablet Take 1 tablet by mouth daily.  . nitroGLYCERIN (NITROSTAT) 0.4 MG SL tablet Place 1 tablet (0.4 mg total) under the tongue every 5 (five) minutes as needed.  . sildenafil (VIAGRA) 100 MG tablet Take 100 mg by mouth daily as needed for erectile dysfunction.  . ticagrelor (BRILINTA) 90 MG TABS tablet Take 1 tablet (90 mg total) by mouth 2 (two) times daily.  [name] Kitchen zinc gluconate 50 MG tablet Take 50 mg by mouth daily.    Past Medical History:  Diagnosis Date  . Arthritis    neck and shoulders  . Chronic kidney disease  chronic kidney disease - stage 3, GFR 30-59 ml/min  . Colon cancer ([place])   . ED (erectile dysfunction)   . History of 2019 novel coronavirus disease (COVID-19) 08/23/2020  . Hyperlipidemia   . Hypertension   . Inguinal hernia   . Pre-diabetes     Past Surgical History:  Procedure Laterality Date  . COLON SURGERY Right 05/06   colectomy and sigmoid colectomy   . COLONOSCOPY WITH PROPOFOL N/A 03/13/2016   Procedure: COLONOSCOPY WITH PROPOFOL;  Surgeon: Lollie Sails, MD;  Location: [facility] ENDOSCOPY;  Service:  Endoscopy;  Laterality: N/A;  . COLONOSCOPY WITH PROPOFOL N/A 03/09/2020   Procedure: COLONOSCOPY WITH PROPOFOL;  Surgeon: Toledo, Benay Pike, MD;  Location: [facility] ENDOSCOPY;  Service: Gastroenterology;  Laterality: N/A;  . CORONARY STENT INTERVENTION N/A 01/12/2021   Procedure: CORONARY STENT INTERVENTION;  Surgeon: Belva Crome, MD;  Location: [place] [facility];  Service: Cardiovascular;  Laterality: N/A;  . EYE SURGERY Left    vitreous retinal surgery  . IABP INSERTION N/A 01/12/2021   Procedure: IABP Insertion;  Surgeon: Yolonda Kida, MD;  Location: [place] [facility];  Service: Cardiovascular;  Laterality: N/A;  . INTRAVASCULAR ULTRASOUND/IVUS N/A 01/12/2021   Procedure: Intravascular Ultrasound/IVUS;  Surgeon: Belva Crome, MD;  Location: [place] [facility];  Service: Cardiovascular;  Laterality: N/A;  . LEFT HEART CATH AND CORONARY ANGIOGRAPHY N/A 01/12/2021   Procedure: LEFT HEART CATH AND CORONARY ANGIOGRAPHY possible PCI and stent;  Surgeon: Yolonda Kida, MD;  Location: [place] [facility];  Service: Cardiovascular;  Laterality: N/A;  . right foot surgery    . XI ROBOTIC ASSISTED INGUINAL HERNIA REPAIR WITH MESH Bilateral 11/10/2020   Procedure: XI ROBOTIC ASSISTED INGUINAL HERNIA REPAIR WITH MESH;  Surgeon: Benjamine Sprague, DO;  Location: [facility] ORS;  Service: General;  Laterality: Bilateral;    Social History Social History   Tobacco Use  . Smoking status: Former Smoker    Quit date: 2000    Years since quitting: 22.1  . Smokeless tobacco: Never Used  Vaping Use  . Vaping Use: Never used  Substance Use Topics  . Alcohol use: No    Comment: glass of wine "once in a while"   . Drug use: No    Family History Family History  Problem Relation Age of Onset  . Heart failure Mother   . Heart attack Father   . Heart attack Brother        Multiple MI's; died in his 81s  . Heart disease Brother        Multiple PCI's first in their 69s  No family history of  bleeding/clotting disorders, porphyria or autoimmune disease   No Known Allergies   REVIEW OF SYSTEMS (Negative unless checked)  Constitutional: [] Weight loss  [] Fever  [] Chills Cardiac: [] Chest pain   [] Chest pressure   [] Palpitations   [] Shortness of breath when laying flat   [] Shortness of breath with exertion. Vascular:  [x] Pain in legs with walking   [] Pain in legs at rest  [] History of DVT   [] Phlebitis   [] Swelling in legs   [] Varicose veins   [] Non-healing ulcers Pulmonary:   [] Uses home oxygen   [] Productive cough   [] Hemoptysis   [] Wheeze  [] COPD   [] Asthma Neurologic:  [] Dizziness   [] Seizures   [] History of stroke   [] History of TIA  [] Aphasia   [] Vissual changes   [] Weakness or numbness in arm   [] Weakness or numbness in leg Musculoskeletal:   []   Joint swelling   [] Joint pain   [] Low back pain Hematologic:  [] Easy bruising  [] Easy bleeding   [] Hypercoagulable state   [] Anemic Gastrointestinal:  [] Diarrhea   [] Vomiting  [] Gastroesophageal reflux/heartburn   [] Difficulty swallowing. Genitourinary:  [x] Chronic kidney disease   [] Difficult urination  [] Frequent urination   [] Blood in urine Skin:  [] Rashes   [] Ulcers  Psychological:  [] History of anxiety   []  History of major depression.  Physical Examination  Vitals:   02/09/21 1116  BP: (!) 150/78  Pulse: 83  Resp: 16  Weight: 138 lb (62.6 kg)  Height: 5\' 11"  (1.803 m)   Body mass index is 19.25 kg/m. Gen: WD/WN, NAD Head: [place]/AT, No temporalis wasting.  Ear/Nose/Throat: Hearing grossly intact, nares w/o erythema or drainage, poor dentition Eyes: PER, EOMI, sclera nonicteric.  Neck: Supple, no masses.  No bruit or JVD.  Pulmonary:  Good air movement, clear to auscultation bilaterally, no use of accessory muscles.  Cardiac: RRR, normal S1, S2, no Murmurs. Vascular:  Vessel Right Left  Radial Palpable Palpable  PT Not Palpable Not Palpable  DP Not Palpable Not Palpable  Gastrointestinal: soft, non-distended. No  guarding/no peritoneal signs.  Musculoskeletal: M/S 5/5 throughout.  No deformity or atrophy.  Neurologic: CN 2-12 intact. Pain and light touch intact in extremities.  Symmetrical.  Speech is fluent. Motor exam as listed above. Psychiatric: Judgment intact, Mood & affect appropriate for pt's clinical situation. Dermatologic: No rashes or ulcers noted.  No changes consistent with cellulitis.  CBC Lab Results  Component Value Date   WBC 7.0 01/13/2021   HGB 13.9 01/13/2021   HCT 40.3 01/13/2021   MCV 100.0 01/13/2021   PLT 240 01/13/2021    BMET    Component Value Date/Time   NA 136 01/13/2021 0109   K 4.0 01/13/2021 0109   CL 104 01/13/2021 0109   CO2 21 (L) 01/13/2021 0109   GLUCOSE 92 01/13/2021 0109   BUN 20 01/13/2021 0109   CREATININE 1.32 (H) 01/13/2021 0109   CALCIUM 8.7 (L) 01/13/2021 0109   GFRNONAA 57 (L) 01/13/2021 0109   CrCl cannot be calculated (Patient's most recent lab result is older than the maximum 21 days allowed.).  COAG Lab Results  Component Value Date   INR 1.1 01/11/2021    Radiology VAS [place] ABI WITH/WO TBI  Result Date: 02/09/2021 LOWER EXTREMITY DOPPLER STUDY Indications: Peripheral artery disease.  Performing Technologist: Charlane Ferretti RT (R)(VS)  Examination Guidelines: A complete evaluation includes at minimum, Doppler waveform signals and systolic blood pressure reading at the level of bilateral brachial, anterior tibial, and posterior tibial arteries, when vessel segments are accessible. Bilateral testing is considered an integral part of a complete examination. Photoelectric Plethysmograph (PPG) waveforms and toe systolic pressure readings are included as required and additional duplex testing as needed. Limited examinations for reoccurring indications may be performed as noted.  ABI Findings: +---------+------------------+-----+---------+----------------+ Right    Rt Pressure (mmHg)IndexWaveform Comment           +---------+------------------+-----+---------+----------------+ Brachial 170                                              +---------+------------------+-----+---------+----------------+ ATA                             triphasicNon compressable +---------+------------------+-----+---------+----------------+ PTA  biphasic Non compressable +---------+------------------+-----+---------+----------------+ Great Toe107               0.61 Abnormal                  +---------+------------------+-----+---------+----------------+ +---------+------------------+-----+----------+----------------+ Left     Lt Pressure (mmHg)IndexWaveform  Comment          +---------+------------------+-----+----------+----------------+ Brachial 174                                               +---------+------------------+-----+----------+----------------+ ATA                             monophasicNon compressable +---------+------------------+-----+----------+----------------+ PTA      153               0.88 monophasic                 +---------+------------------+-----+----------+----------------+ Great Toe95                0.55 Abnormal                   +---------+------------------+-----+----------+----------------+  Summary: Right: Resting right ankle-brachial index indicates noncompressible right lower extremity arteries. The right toe-brachial index is abnormal. ABIs are unreliable. Left: Resting left ankle-brachial index indicates noncompressible left lower extremity arteries. The left toe-brachial index is abnormal. ABIs are unreliable. *See table(s) above for measurements and observations.  Electronically signed by Hortencia Pilar MD on 02/09/2021 at 5:04:48 PM.   Final      Assessment/Plan 1. Atherosclerosis of native artery of both lower extremities with intermittent claudication (HCC)  Recommend:  The patient has evidence of atherosclerosis of  the lower extremities with claudication.  The patient does not voice lifestyle limiting changes at this point in time.  Noninvasive studies do not suggest clinically significant change.  No invasive studies, angiography or surgery at this time The patient should continue walking and begin a more formal exercise program.  The patient should continue antiplatelet therapy and aggressive treatment of the lipid abnormalities  No changes in the patient's medications at this time  - VAS [place] ABI WITH/WO TBI; Future  2. CAD in native artery Continue cardiac and antihypertensive medications as already ordered and reviewed, no changes at this time.  Continue statin as ordered and reviewed, no changes at this time  Nitrates PRN for chest pain   3. Essential hypertension Continue antihypertensive medications as already ordered, these medications have been reviewed and there are no changes at this time.   4. Mixed hyperlipidemia Continue statin as ordered and reviewed, no changes at this time     Hortencia Pilar, MD  02/12/2021 3:09 PM

## 2021-02-13 ENCOUNTER — Other Ambulatory Visit: Payer: Self-pay

## 2021-02-13 VITALS — Ht 70.0 in | Wt 136.0 lb

## 2021-02-13 DIAGNOSIS — Z955 Presence of coronary angioplasty implant and graft: Secondary | ICD-10-CM | POA: Diagnosis not present

## 2021-02-13 DIAGNOSIS — I214 Non-ST elevation (NSTEMI) myocardial infarction: Secondary | ICD-10-CM

## 2021-02-13 DIAGNOSIS — I252 Old myocardial infarction: Secondary | ICD-10-CM | POA: Diagnosis not present

## 2021-02-13 NOTE — Progress Notes (Signed)
Cardiac Individual Treatment Plan  Patient Details  Name: Carl Townsend MRN: [id number] Date of Birth: 1946-06-20 Referring Provider:   Flowsheet Row Cardiac Rehab from 02/13/2021 in [facility] Cardiac and Pulmonary Rehab  Referring Provider [facility]      Initial Encounter Date:  Flowsheet Row Cardiac Rehab from 02/13/2021 in [facility] Cardiac and Pulmonary Rehab  Date 02/13/21      Visit Diagnosis: NSTEMI (non-ST elevated myocardial infarction) [facility])  Status post coronary artery stent placement  Patient's Home Medications on Admission:  Current Outpatient Medications:  .  albuterol (VENTOLIN HFA) 108 (90 Base) MCG/ACT inhaler, Inhale 2 puffs into the lungs every 4 (four) hours as needed for wheezing or shortness of breath., Disp: 1 each, Rfl: 0 .  ascorbic acid (VITAMIN C) 500 MG tablet, Take 500 mg by mouth daily., Disp: , Rfl:  .  aspirin 81 MG tablet, Take 81 mg by mouth daily., Disp: , Rfl:  .  atenolol (TENORMIN) 50 MG tablet, Take 50 mg by mouth at bedtime., Disp: , Rfl:  .  atorvastatin (LIPITOR) 80 MG tablet, Take 1 tablet (80 mg total) by mouth at bedtime., Disp: 30 tablet, Rfl: 2 .  budesonide (PULMICORT) 1 MG/2ML nebulizer solution, Inhale 2 mLs into the lungs daily., Disp: , Rfl:  .  candesartan (ATACAND) 16 MG tablet, Take 16 mg by mouth daily., Disp: , Rfl:  .  Carboxymethylcellulose Sodium (REFRESH LIQUIGEL) 1 % GEL, Place 1 drop into both eyes at bedtime. , Disp: , Rfl:  .  cyanocobalamin 1000 MCG tablet, Take 1,000 mcg by mouth daily., Disp: , Rfl:  .  ergocalciferol (VITAMIN D2) 1.25 MG (50000 UT) capsule, Take 50,000 Units by mouth once a week. (Patient not taking: Reported on 02/10/2021), Disp: , Rfl:  .  Glycerin-Polysorbate 80 (REFRESH DRY EYE THERAPY OP), Place 1 drop into both eyes daily as needed (dry eye). (Patient not taking: Reported on 02/10/2021), Disp: , Rfl:  .  Multiple Vitamin (MULTIVITAMIN) tablet, Take 1 tablet by mouth daily., Disp: , Rfl:  .   nitroGLYCERIN (NITROSTAT) 0.4 MG SL tablet, Place 1 tablet (0.4 mg total) under the tongue every 5 (five) minutes as needed., Disp: 25 tablet, Rfl: 2 .  sildenafil (VIAGRA) 100 MG tablet, Take 100 mg by mouth daily as needed for erectile dysfunction., Disp: , Rfl:  .  ticagrelor (BRILINTA) 90 MG TABS tablet, Take 1 tablet (90 mg total) by mouth 2 (two) times daily., Disp: 60 tablet, Rfl: 2 .  zinc gluconate 50 MG tablet, Take 50 mg by mouth daily., Disp: , Rfl:   Past Medical History: Past Medical History:  Diagnosis Date  . Arthritis    neck and shoulders  . Chronic kidney disease    chronic kidney disease - stage 3, GFR 30-59 ml/min  . Colon cancer ([place])   . ED (erectile dysfunction)   . History of 2019 novel coronavirus disease (COVID-19) 08/23/2020  . Hyperlipidemia   . Hypertension   . Inguinal hernia   . Pre-diabetes     Tobacco Use: Social History   Tobacco Use  Smoking Status Former Smoker  . Quit date: 2000  . Years since quitting: 22.1  Smokeless Tobacco Never Used    Labs: Recent Chemical engineer    Labs for ITP Cardiac and Pulmonary Rehab Latest Ref Rng & Units 01/11/2021 01/12/2021   Cholestrol 0 - 200 mg/dL - 147   LDLCALC 0 - 99 mg/dL - 87   HDL >40 mg/dL - 45  Trlycerides <150 mg/dL - 77   Hemoglobin A1c 4.8 - 5.6 % 5.2 -       Exercise Target Goals: Exercise Program Goal: Individual exercise prescription set using results from initial 6 min walk test and THRR while considering  patient's activity barriers and safety.   Exercise Prescription Goal: Initial exercise prescription builds to 30-45 minutes a day of aerobic activity, 2-3 days per week.  Home exercise guidelines will be given to patient during program as part of exercise prescription that the participant will acknowledge.   Education: Aerobic Exercise: - Group verbal and visual presentation on the components of exercise prescription. Introduces F.I.T.T principle from ACSM for exercise  prescriptions.  Reviews F.I.T.T. principles of aerobic exercise including progression. Written material given at graduation.   Education: Resistance Exercise: - Group verbal and visual presentation on the components of exercise prescription. Introduces F.I.T.T principle from ACSM for exercise prescriptions  Reviews F.I.T.T. principles of resistance exercise including progression. Written material given at graduation.    Education: Exercise & Equipment Safety: - Individual verbal instruction and demonstration of equipment use and safety with use of the equipment. Flowsheet Row Cardiac Rehab from 02/13/2021 in [facility] Cardiac and Pulmonary Rehab  Date 02/13/21  Educator AS  Instruction Review Code 1- Verbalizes Understanding      Education: Exercise Physiology & General Exercise Guidelines: - Group verbal and written instruction with models to review the exercise physiology of the cardiovascular system and associated critical values. Provides general exercise guidelines with specific guidelines to those with heart or lung disease.    Education: Flexibility, Balance, Mind/Body Relaxation: - Group verbal and visual presentation with interactive activity on the components of exercise prescription. Introduces F.I.T.T principle from ACSM for exercise prescriptions. Reviews F.I.T.T. principles of flexibility and balance exercise training including progression. Also discusses the mind body connection.  Reviews various relaxation techniques to help reduce and manage stress (i.e. Deep breathing, progressive muscle relaxation, and visualization). Balance handout provided to take home. Written material given at graduation.   Activity Barriers & Risk Stratification:  Activity Barriers & Cardiac Risk Stratification - 02/10/21 1306      Activity Barriers & Cardiac Risk Stratification   Activity Barriers Back Problems;Arthritis;Neck/Spine Problems;Other (comment)   arthritis in back and neck   Comments  numbness in both feet related to arthritis    Cardiac Risk Stratification High           6 Minute Walk:  6 Minute Walk    Row Name 02/13/21 1505         6 Minute Walk   Phase Initial     Distance 900 feet     Walk Time 6 minutes     # of Rest Breaks 0     MPH 1.7     METS 2.82     RPE 11     Perceived Dyspnea  1     VO2 Peak 9.9     Symptoms No     Resting HR 88 bpm     Resting BP 128/66     Resting Oxygen Saturation  96 %     Exercise Oxygen Saturation  during 6 min walk 95 %     Max Ex. HR 110 bpm     Max Ex. BP 154/74     2 Minute Post BP 124/64            Oxygen Initial Assessment:   Oxygen Re-Evaluation:   Oxygen Discharge (Final Oxygen Re-Evaluation):   Initial  Exercise Prescription:  Initial Exercise Prescription - 02/13/21 1500      Date of Initial Exercise RX and Referring Provider   Date 02/13/21    Referring Provider [facility]      Treadmill   MPH 1.7    Grade 1.5    Minutes 15    METs 2.5      Recumbant Bike   Level 2    RPM 60    Minutes 15    METs 2.8      NuStep   Level 2    SPM 80    Minutes 15    METs 2.8      T5 Nustep   Level 1    SPM 80    Minutes 15    METs 2.8      Biostep-RELP   Level 2    SPM 50    Minutes 15    METs 2.5      Prescription Details   Frequency (times per week) 3    Duration Progress to 30 minutes of continuous aerobic without signs/symptoms of physical distress      Intensity   THRR 40-80% of Max Heartrate 111-134    Ratings of Perceived Exertion 11-13    Perceived Dyspnea 0-4      Progression   Progression Continue to progress workloads to maintain intensity without signs/symptoms of physical distress.      Resistance Training   Training Prescription Yes    Weight 3 lb    Reps 10-15           Perform Capillary Blood Glucose checks as needed.  Exercise Prescription Changes:  Exercise Prescription Changes    Row Name 02/13/21 1500             Response to Exercise    Blood Pressure (Admit) 128/66       Blood Pressure (Exercise) 154/74       Blood Pressure (Exit) 124/64       Heart Rate (Admit) 88 bpm       Heart Rate (Exercise) 110 bpm       Heart Rate (Exit) 94 bpm       Oxygen Saturation (Admit) 96 %       Oxygen Saturation (Exercise) 95 %       Rating of Perceived Exertion (Exercise) 11       Perceived Dyspnea (Exercise) 1       Symptoms none              Exercise Comments:   Exercise Goals and Review:  Exercise Goals    Row Name 02/13/21 1523             Exercise Goals   Increase Physical Activity Yes       Intervention Provide advice, education, support and counseling about physical activity/exercise needs.;Develop an individualized exercise prescription for aerobic and resistive training based on initial evaluation findings, risk stratification, comorbidities and participant's personal goals.       Expected Outcomes Short Term: Attend rehab on a regular basis to increase amount of physical activity.;Long Term: Add in home exercise to make exercise part of routine and to increase amount of physical activity.;Long Term: Exercising regularly at least 3-5 days a week.       Increase Strength and Stamina Yes       Intervention Provide advice, education, support and counseling about physical activity/exercise needs.;Develop an individualized exercise prescription for aerobic and resistive training based on initial evaluation findings,  risk stratification, comorbidities and participant's personal goals.       Expected Outcomes Short Term: Increase workloads from initial exercise prescription for resistance, speed, and METs.;Short Term: Perform resistance training exercises routinely during rehab and add in resistance training at home;Long Term: Improve cardiorespiratory fitness, muscular endurance and strength as measured by increased METs and functional capacity (6MWT)       Able to understand and use rate of perceived exertion (RPE) scale Yes        Intervention Provide education and explanation on how to use RPE scale       Expected Outcomes Short Term: Able to use RPE daily in rehab to express subjective intensity level;Long Term:  Able to use RPE to guide intensity level when exercising independently       Able to understand and use Dyspnea scale Yes       Intervention Provide education and explanation on how to use Dyspnea scale       Expected Outcomes Short Term: Able to use Dyspnea scale daily in rehab to express subjective sense of shortness of breath during exertion;Long Term: Able to use Dyspnea scale to guide intensity level when exercising independently       Knowledge and understanding of Target Heart Rate Range (THRR) Yes       Intervention Provide education and explanation of THRR including how the numbers were predicted and where they are located for reference       Expected Outcomes Short Term: Able to state/look up THRR;Short Term: Able to use daily as guideline for intensity in rehab;Long Term: Able to use THRR to govern intensity when exercising independently       Able to check pulse independently Yes       Intervention Provide education and demonstration on how to check pulse in carotid and radial arteries.;Review the importance of being able to check your own pulse for safety during independent exercise       Expected Outcomes Short Term: Able to explain why pulse checking is important during independent exercise;Long Term: Able to check pulse independently and accurately       Understanding of Exercise Prescription Yes       Intervention Provide education, explanation, and written materials on patient's individual exercise prescription       Expected Outcomes Short Term: Able to explain program exercise prescription;Long Term: Able to explain home exercise prescription to exercise independently              Exercise Goals Re-Evaluation :   Discharge Exercise Prescription (Final Exercise Prescription Changes):   Exercise Prescription Changes - 02/13/21 1500      Response to Exercise   Blood Pressure (Admit) 128/66    Blood Pressure (Exercise) 154/74    Blood Pressure (Exit) 124/64    Heart Rate (Admit) 88 bpm    Heart Rate (Exercise) 110 bpm    Heart Rate (Exit) 94 bpm    Oxygen Saturation (Admit) 96 %    Oxygen Saturation (Exercise) 95 %    Rating of Perceived Exertion (Exercise) 11    Perceived Dyspnea (Exercise) 1    Symptoms none           Nutrition:  Target Goals: Understanding of nutrition guidelines, daily intake of sodium 1500mg , cholesterol 200mg , calories 30% from fat and 7% or less from saturated fats, daily to have 5 or more servings of fruits and vegetables.  Education: All About Nutrition: -Group instruction provided by verbal, written material, interactive activities, discussions, models,  and posters to present general guidelines for heart healthy nutrition including fat, fiber, MyPlate, the role of sodium in heart healthy nutrition, utilization of the nutrition label, and utilization of this knowledge for meal planning. Follow up email sent as well. Written material given at graduation.   Biometrics:  Pre Biometrics - 02/13/21 1524      Pre Biometrics   Height 5\' 10"  (1.778 m)    Weight 136 lb (61.7 kg)    BMI (Calculated) 19.51    Single Leg Stand 3 seconds            Nutrition Therapy Plan and Nutrition Goals:  Nutrition Therapy & Goals - 02/13/21 1428      Nutrition Therapy   Diet Heart healthy, low Na    Drug/Food Interactions Statins/Certain Fruits    Protein (specify units) 70g    Fiber 30 grams    Whole Grain Foods 3 servings    Saturated Fats 12 max. grams    Fruits and Vegetables 8 servings/day    Sodium 1.5 grams      Personal Nutrition Goals   Nutrition Goal ST: add nuts or peanut butter to breakfast, add in protein snack like boiled egg, try whole wheat bread LT: gain back muscle    Comments His wife is a Biomedical scientist and she cooks low salt, baked,  limits fried food. He will have cereal during the week. Wife uses olive oil when cooking. He eats fish 1x/week, cut down on red meat, pork chop baked once in awhile, will eat baked chicken. He likes mashed potatoes, green bean, peas, and fruit. B: cereal (cheerios) - whole milk with some fruit. Green tea (lemon and honey). D: meat, vegetables (starch/non-starchy). He uses white wheat bread, but will try whole wheat bread, he also like whole wheat pasta and brown rice. He reports his weight has gone down ~6lbs since heart event due to low appetite. Discussed heart healthy eating and how to increase calories and protein to gain back muscle.      Intervention Plan   Intervention Prescribe, educate and counsel regarding individualized specific dietary modifications aiming towards targeted core components such as weight, hypertension, lipid management, diabetes, heart failure and other comorbidities.;Nutrition handout(s) given to patient.    Expected Outcomes Short Term Goal: Understand basic principles of dietary content, such as calories, fat, sodium, cholesterol and nutrients.;Short Term Goal: A plan has been developed with personal nutrition goals set during dietitian appointment.;Long Term Goal: Adherence to prescribed nutrition plan.           Nutrition Assessments:  MEDIFICTS Score Key:  ?70 Need to make dietary changes   40-70 Heart Healthy Diet  ? 40 Therapeutic Level Cholesterol Diet  Flowsheet Row Cardiac Rehab from 02/13/2021 in [facility] Cardiac and Pulmonary Rehab  Picture Your Plate Total Score on Admission 60     Picture Your Plate Scores:  <09 Unhealthy dietary pattern with much room for improvement.  41-50 Dietary pattern unlikely to meet recommendations for good health and room for improvement.  51-60 More healthful dietary pattern, with some room for improvement.   >60 Healthy dietary pattern, although there may be some specific behaviors that could be improved.     Nutrition Goals Re-Evaluation:   Nutrition Goals Discharge (Final Nutrition Goals Re-Evaluation):   Psychosocial: Target Goals: Acknowledge presence or absence of significant depression and/or stress, maximize coping skills, provide positive support system. Participant is able to verbalize types and ability to use techniques and skills needed for reducing stress  and depression.   Education: Stress, Anxiety, and Depression - Group verbal and visual presentation to define topics covered.  Reviews how body is impacted by stress, anxiety, and depression.  Also discusses healthy ways to reduce stress and to treat/manage anxiety and depression.  Written material given at graduation.   Education: Sleep Hygiene -Provides group verbal and written instruction about how sleep can affect your health.  Define sleep hygiene, discuss sleep cycles and impact of sleep habits. Review good sleep hygiene tips.    Initial Review & Psychosocial Screening:  Initial Psych Review & Screening - 02/10/21 1308      Initial Review   Current issues with Current Stress Concerns    Source of Stress Concerns Unable to participate in former interests or hobbies;Unable to perform yard/household activities      [place]? Yes   wife     Barriers   Psychosocial barriers to participate in program There are no identifiable barriers or psychosocial needs.;The patient should benefit from training in stress management and relaxation.      Screening Interventions   Interventions Encouraged to exercise;Provide feedback about the scores to participant;To provide support and resources with identified psychosocial needs    Expected Outcomes Short Term goal: Utilizing psychosocial counselor, staff and physician to assist with identification of specific Stressors or current issues interfering with healing process. Setting desired goal for each stressor or current issue identified.;Long Term Goal:  Stressors or current issues are controlled or eliminated.;Short Term goal: Identification and review with participant of any Quality of Life or Depression concerns found by scoring the questionnaire.;Long Term goal: The participant improves quality of Life and PHQ9 Scores as seen by post scores and/or verbalization of changes           Quality of Life Scores:   Quality of Life - 02/13/21 1529      Quality of Life   Select Quality of Life      Quality of Life Scores   Health/Function Pre 20.93 %    Socioeconomic Pre 23.5 %    Psych/Spiritual Pre 29.29 %    Family Pre 20.5 %    GLOBAL Pre 23.19 %          Scores of 19 and below usually indicate a poorer quality of life in these areas.  A difference of  2-3 points is a clinically meaningful difference.  A difference of 2-3 points in the total score of the Quality of Life Index has been associated with significant improvement in overall quality of life, self-image, physical symptoms, and general health in studies assessing change in quality of life.  PHQ-9: Recent Review Flowsheet Data    Depression screen [facility] 2/9 02/13/2021   Decreased Interest 0   Down, Depressed, Hopeless 0   PHQ - 2 Score 0   Altered sleeping 0   Tired, decreased energy 1   Change in appetite 1   Feeling bad or failure about yourself  0   Trouble concentrating 0   Moving slowly or fidgety/restless 0   Suicidal thoughts 0   PHQ-9 Score 2   Difficult doing work/chores Not difficult at all     Interpretation of Total Score  Total Score Depression Severity:  1-4 = Minimal depression, 5-9 = Mild depression, 10-14 = Moderate depression, 15-19 = Moderately severe depression, 20-27 = Severe depression   Psychosocial Evaluation and Intervention:  Psychosocial Evaluation - 02/10/21 1312      Psychosocial Evaluation &  Interventions   Interventions Encouraged to exercise with the program and follow exercise prescription    Comments Mr. Bozman reports doing  well after his NSTEMI. He had some chest discomfort soon after discharge that resulted in him taking nitro and led to low blood pressure. Thankfully he hasn't had any more since then and has been feeling better after some medication adjustment. His biggest stressor right now is building back up his stamina so he is excited to start cardiac rehab. His wife is his main support system and feels like they are doing well working together after his MI. He is retired but keeps to the same sleep schedule of going to bed late and waking up later.    Expected Outcomes Short: attend cardiac rehab for education and exercise. Long: develop and maintain positive self care habits.    Continue Psychosocial Services  Follow up required by staff           Psychosocial Re-Evaluation:   Psychosocial Discharge (Final Psychosocial Re-Evaluation):   Vocational Rehabilitation: Provide vocational rehab assistance to qualifying candidates.   Vocational Rehab Evaluation & Intervention:  Vocational Rehab - 02/10/21 1305      Initial Vocational Rehab Evaluation & Intervention   Assessment shows need for Vocational Rehabilitation No           Education: Education Goals: Education classes will be provided on a variety of topics geared toward better understanding of heart health and risk factor modification. Participant will state understanding/return demonstration of topics presented as noted by education test scores.  Learning Barriers/Preferences:  Learning Barriers/Preferences - 02/10/21 1305      Learning Barriers/Preferences   Learning Barriers None    Learning Preferences None           General Cardiac Education Topics:  AED/CPR: - Group verbal and written instruction with the use of models to demonstrate the basic use of the AED with the basic ABC's of resuscitation.   Anatomy and Cardiac Procedures: - Group verbal and visual presentation and models provide information about basic cardiac  anatomy and function. Reviews the testing methods done to diagnose heart disease and the outcomes of the test results. Describes the treatment choices: Medical Management, Angioplasty, or Coronary Bypass Surgery for treating various heart conditions including Myocardial Infarction, Angina, Valve Disease, and Cardiac Arrhythmias.  Written material given at graduation.   Medication Safety: - Group verbal and visual instruction to review commonly prescribed medications for heart and lung disease. Reviews the medication, class of the drug, and side effects. Includes the steps to properly store meds and maintain the prescription regimen.  Written material given at graduation.   Intimacy: - Group verbal instruction through game format to discuss how heart and lung disease can affect sexual intimacy. Written material given at graduation..   Know Your Numbers and Heart Failure: - Group verbal and visual instruction to discuss disease risk factors for cardiac and pulmonary disease and treatment options.  Reviews associated critical values for Overweight/Obesity, Hypertension, Cholesterol, and Diabetes.  Discusses basics of heart failure: signs/symptoms and treatments.  Introduces Heart Failure Zone chart for action plan for heart failure.  Written material given at graduation.   Infection Prevention: - Provides verbal and written material to individual with discussion of infection control including proper hand washing and proper equipment cleaning during exercise session. Flowsheet Row Cardiac Rehab from 02/13/2021 in [facility] Cardiac and Pulmonary Rehab  Date 02/13/21  Educator AS  Instruction Review Code 1- Verbalizes Understanding  Falls Prevention: - Provides verbal and written material to individual with discussion of falls prevention and safety. Flowsheet Row Cardiac Rehab from 02/13/2021 in [facility] Cardiac and Pulmonary Rehab  Date 02/13/21  Educator AS  Instruction Review Code 1- Verbalizes  Understanding      Other: -Provides group and verbal instruction on various topics (see comments)   Knowledge Questionnaire Score:  Knowledge Questionnaire Score - 02/13/21 1527      Knowledge Questionnaire Score   Pre Score 21/26 heart disease /exercise           Core Components/Risk Factors/Patient Goals at Admission:  Personal Goals and Risk Factors at Admission - 02/13/21 1525      Core Components/Risk Factors/Patient Goals on Admission    Weight Management Yes;Weight Gain    Admit Weight 136 lb (61.7 kg)    Goal Weight: Short Term 140 lb (63.5 kg)    Goal Weight: Long Term 145 lb (65.8 kg)    Expected Outcomes Short Term: Continue to assess and modify interventions until short term weight is achieved;Long Term: Adherence to nutrition and physical activity/exercise program aimed toward attainment of established weight goal;Weight Gain: Understanding of general recommendations for a high calorie, high protein meal plan that promotes weight gain by distributing calorie intake throughout the day with the consumption for 4-5 meals, snacks, and/or supplements    Hypertension Yes    Intervention Provide education on lifestyle modifcations including regular physical activity/exercise, weight management, moderate sodium restriction and increased consumption of fresh fruit, vegetables, and low fat dairy, alcohol moderation, and smoking cessation.;Monitor prescription use compliance.    Expected Outcomes Short Term: Continued assessment and intervention until BP is < 140/37mm HG in hypertensive participants. < 130/51mm HG in hypertensive participants with diabetes, heart failure or chronic kidney disease.;Long Term: Maintenance of blood pressure at goal levels.    Lipids Yes    Intervention Provide education and support for participant on nutrition & aerobic/resistive exercise along with prescribed medications to achieve LDL 70mg , HDL >40mg .    Expected Outcomes Short Term: Participant  states understanding of desired cholesterol values and is compliant with medications prescribed. Participant is following exercise prescription and nutrition guidelines.;Long Term: Cholesterol controlled with medications as prescribed, with individualized exercise RX and with personalized nutrition plan. Value goals: LDL < 70mg , HDL > 40 mg.           Education:Diabetes - Individual verbal and written instruction to review signs/symptoms of diabetes, desired ranges of glucose level fasting, after meals and with exercise. Acknowledge that pre and post exercise glucose checks will be done for 3 sessions at entry of program.   Core Components/Risk Factors/Patient Goals Review:    Core Components/Risk Factors/Patient Goals at Discharge (Final Review):    ITP Comments:  ITP Comments    Row Name 02/10/21 1315           ITP Comments Initial telephone orientation completed. Diagnosis can be found in [facility] 1/13. EP orientation scheduled for Monday 2/14 at 1pm.              Comments: initial ITP

## 2021-02-13 NOTE — Patient Instructions (Signed)
Patient Instructions  Patient Details  Name: Carl Townsend MRN: [id number] Date of Birth: 09-28-1946 Referring Provider:  Yolonda Kida, MD  Below are your personal goals for exercise, nutrition, and risk factors. Our goal is to help you stay on track towards obtaining and maintaining these goals. We will be discussing your progress on these goals with you throughout the program.  Initial Exercise Prescription:  Initial Exercise Prescription - 02/13/21 1500      Date of Initial Exercise RX and Referring Provider   Date 02/13/21    Referring Provider [facility]      Treadmill   MPH 1.7    Grade 1.5    Minutes 15    METs 2.5      Recumbant Bike   Level 2    RPM 60    Minutes 15    METs 2.8      NuStep   Level 2    SPM 80    Minutes 15    METs 2.8      T5 Nustep   Level 1    SPM 80    Minutes 15    METs 2.8      Biostep-RELP   Level 2    SPM 50    Minutes 15    METs 2.5      Prescription Details   Frequency (times per week) 3    Duration Progress to 30 minutes of continuous aerobic without signs/symptoms of physical distress      Intensity   THRR 40-80% of Max Heartrate 111-134    Ratings of Perceived Exertion 11-13    Perceived Dyspnea 0-4      Progression   Progression Continue to progress workloads to maintain intensity without signs/symptoms of physical distress.      Resistance Training   Training Prescription Yes    Weight 3 lb    Reps 10-15           Exercise Goals: Frequency: Be able to perform aerobic exercise two to three times per week in program working toward 2-5 days per week of home exercise.  Intensity: Work with a perceived exertion of 11 (fairly light) - 15 (hard) while following your exercise prescription.  We will make changes to your prescription with you as you progress through the program.   Duration: Be able to do 30 to 45 minutes of continuous aerobic exercise in addition to a 5 minute warm-up and a 5 minute  cool-down routine.   Nutrition Goals: Your personal nutrition goals will be established when you do your nutrition analysis with the dietician.  The following are general nutrition guidelines to follow: Cholesterol < 200mg /day Sodium < 1500mg /day Fiber: Men over 50 yrs - 30 grams per day  Personal Goals:  Personal Goals and Risk Factors at Admission - 02/13/21 1525      Core Components/Risk Factors/Patient Goals on Admission    Weight Management Yes;Weight Gain    Admit Weight 136 lb (61.7 kg)    Goal Weight: Short Term 140 lb (63.5 kg)    Goal Weight: Long Term 145 lb (65.8 kg)    Expected Outcomes Short Term: Continue to assess and modify interventions until short term weight is achieved;Long Term: Adherence to nutrition and physical activity/exercise program aimed toward attainment of established weight goal;Weight Gain: Understanding of general recommendations for a high calorie, high protein meal plan that promotes weight gain by distributing calorie intake throughout the day with the consumption for 4-5 meals, snacks,  and/or supplements    Hypertension Yes    Intervention Provide education on lifestyle modifcations including regular physical activity/exercise, weight management, moderate sodium restriction and increased consumption of fresh fruit, vegetables, and low fat dairy, alcohol moderation, and smoking cessation.;Monitor prescription use compliance.    Expected Outcomes Short Term: Continued assessment and intervention until BP is < 140/30mm HG in hypertensive participants. < 130/23mm HG in hypertensive participants with diabetes, heart failure or chronic kidney disease.;Long Term: Maintenance of blood pressure at goal levels.    Lipids Yes    Intervention Provide education and support for participant on nutrition & aerobic/resistive exercise along with prescribed medications to achieve LDL 70mg , HDL >40mg .    Expected Outcomes Short Term: Participant states understanding of  desired cholesterol values and is compliant with medications prescribed. Participant is following exercise prescription and nutrition guidelines.;Long Term: Cholesterol controlled with medications as prescribed, with individualized exercise RX and with personalized nutrition plan. Value goals: LDL < 70mg , HDL > 40 mg.           Tobacco Use Initial Evaluation: Social History   Tobacco Use  Smoking Status Former Smoker  . Quit date: 2000  . Years since quitting: 22.1  Smokeless Tobacco Never Used    Exercise Goals and Review:  Exercise Goals    Row Name 02/13/21 1523             Exercise Goals   Increase Physical Activity Yes       Intervention Provide advice, education, support and counseling about physical activity/exercise needs.;Develop an individualized exercise prescription for aerobic and resistive training based on initial evaluation findings, risk stratification, comorbidities and participant's personal goals.       Expected Outcomes Short Term: Attend rehab on a regular basis to increase amount of physical activity.;Long Term: Add in home exercise to make exercise part of routine and to increase amount of physical activity.;Long Term: Exercising regularly at least 3-5 days a week.       Increase Strength and Stamina Yes       Intervention Provide advice, education, support and counseling about physical activity/exercise needs.;Develop an individualized exercise prescription for aerobic and resistive training based on initial evaluation findings, risk stratification, comorbidities and participant's personal goals.       Expected Outcomes Short Term: Increase workloads from initial exercise prescription for resistance, speed, and METs.;Short Term: Perform resistance training exercises routinely during rehab and add in resistance training at home;Long Term: Improve cardiorespiratory fitness, muscular endurance and strength as measured by increased METs and functional capacity  (6MWT)       Able to understand and use rate of perceived exertion (RPE) scale Yes       Intervention Provide education and explanation on how to use RPE scale       Expected Outcomes Short Term: Able to use RPE daily in rehab to express subjective intensity level;Long Term:  Able to use RPE to guide intensity level when exercising independently       Able to understand and use Dyspnea scale Yes       Intervention Provide education and explanation on how to use Dyspnea scale       Expected Outcomes Short Term: Able to use Dyspnea scale daily in rehab to express subjective sense of shortness of breath during exertion;Long Term: Able to use Dyspnea scale to guide intensity level when exercising independently       Knowledge and understanding of Target Heart Rate Range (THRR) Yes  Intervention Provide education and explanation of THRR including how the numbers were predicted and where they are located for reference       Expected Outcomes Short Term: Able to state/look up THRR;Short Term: Able to use daily as guideline for intensity in rehab;Long Term: Able to use THRR to govern intensity when exercising independently       Able to check pulse independently Yes       Intervention Provide education and demonstration on how to check pulse in carotid and radial arteries.;Review the importance of being able to check your own pulse for safety during independent exercise       Expected Outcomes Short Term: Able to explain why pulse checking is important during independent exercise;Long Term: Able to check pulse independently and accurately       Understanding of Exercise Prescription Yes       Intervention Provide education, explanation, and written materials on patient's individual exercise prescription       Expected Outcomes Short Term: Able to explain program exercise prescription;Long Term: Able to explain home exercise prescription to exercise independently              Copy of goals given to  participant.

## 2021-02-15 ENCOUNTER — Other Ambulatory Visit: Payer: Self-pay

## 2021-02-15 DIAGNOSIS — I214 Non-ST elevation (NSTEMI) myocardial infarction: Secondary | ICD-10-CM

## 2021-02-15 DIAGNOSIS — Z955 Presence of coronary angioplasty implant and graft: Secondary | ICD-10-CM

## 2021-02-15 NOTE — Progress Notes (Signed)
Daily Session Note  Patient Details  Name: Carl Townsend MRN: [id number] Date of Birth: Oct 13, 1946 Referring Provider:   Flowsheet Row Cardiac Rehab from 02/13/2021 in [facility] Card[facility] Rehab  Referring Provider [facility]      Encounter Date: 02/15/2021  Check In:  Session Check In - 02/15/21 1327      Check-In   Supervising physician immediately available to respond to emergencies See telemetry face sheet for immediately available ER MD    Location [facility]-Cardiac & Pulmonary Rehab    Staff Present Birdie Sons, MPA, Nino Glow, MS Exercise Physiologist;Joseph Flavia Shipper    Virtual Visit No    Medication changes reported     No    Fall or balance concerns reported    No    Warm-up and Cool-down Performed on first and last piece of equipment    Resistance Training Performed Yes    VAD Patient? No    PAD/SET Patient? No      Pain Assessment   Currently in Pain? No/denies              Social History   Tobacco Use  Smoking Status Former Smoker  . Quit date: 2000  . Years since quitting: 22.1  Smokeless Tobacco Never Used    Goals Met:  Independence with exercise equipment Exercise tolerated well No report of cardiac concerns or symptoms Strength training completed today  Goals Unmet:  Not Applicable  Comments: First full day of exercise!  Patient was oriented to gym and equipment including functions, settings, policies, and procedures.  Patient's individual exercise prescription and treatment plan were reviewed.  All starting workloads were established based on the results of the 6 minute walk test done at initial orientation visit.  The plan for exercise progression was also introduced and progression will be customized based on patient's performance and goals.     Dr. Emily Filbert is Medical Director for [facility] and [facility].

## 2021-02-16 ENCOUNTER — Encounter: Payer: Medicare Other | Admitting: *Deleted

## 2021-02-16 ENCOUNTER — Other Ambulatory Visit: Payer: Self-pay

## 2021-02-16 DIAGNOSIS — Z955 Presence of coronary angioplasty implant and graft: Secondary | ICD-10-CM

## 2021-02-16 DIAGNOSIS — I214 Non-ST elevation (NSTEMI) myocardial infarction: Secondary | ICD-10-CM

## 2021-02-16 NOTE — Progress Notes (Signed)
Daily Session Note  Patient Details  Name: Carl Townsend MRN: [id number] Date of Birth: [birth year] Referring Provider:   Flowsheet Row Cardiac Rehab from 02/13/2021 in [facility] Re[facility]  Referring Provider [facility]      Encounter Date: 02/16/2021  Check In:  Session Check In - 02/16/21 1337      Check-In   Supervising physician immediately available to respond to emergencies See telemetry face sheet for immediately available ER MD    Location [facility]-Cardiac & Pulmonary Rehab    Staff Present Renita Papa, RN BSN;Joseph [address] [place], [place], [place], CCRP, CCET    Virtual Visit No    Medication changes reported     No    Fall or balance concerns reported    No    Warm-up and Cool-down Performed on first and last piece of equipment    Resistance Training Performed Yes    VAD Patient? No    PAD/SET Patient? No      Pain Assessment   Currently in Pain? No/denies              Social History   Tobacco Use  Smoking Status Former Smoker  . Quit date: 2000  . Years since quitting: 22.1  Smokeless Tobacco Never Used    Goals Met:  Independence with exercise equipment Exercise tolerated well No report of cardiac concerns or symptoms Strength training completed today  Goals Unmet:  Not Applicable  Comments: Pt able to follow exercise prescription today without complaint.  Will continue to monitor for progression.    Dr. Emily Filbert is Medical Director for [facility] and [facility].

## 2021-02-17 ENCOUNTER — Encounter (INDEPENDENT_AMBULATORY_CARE_PROVIDER_SITE_OTHER): Payer: Self-pay

## 2021-02-20 ENCOUNTER — Other Ambulatory Visit: Payer: Self-pay

## 2021-02-20 DIAGNOSIS — Z955 Presence of coronary angioplasty implant and graft: Secondary | ICD-10-CM | POA: Diagnosis not present

## 2021-02-20 DIAGNOSIS — I214 Non-ST elevation (NSTEMI) myocardial infarction: Secondary | ICD-10-CM

## 2021-02-20 NOTE — Progress Notes (Signed)
Daily Session Note  Patient Details  Name: Carl Townsend MRN: [id number] Date of Birth: 04/30/1946 Referring Provider:   Flowsheet Row Cardiac Rehab from 02/13/2021 in [facility] Cardiac and Pulmonary Rehab  Referring Provider [facility]      Encounter Date: 02/20/2021  Check In:  Session Check In - 02/20/21 1358      Check-In   Supervising physician immediately available to respond to emergencies See telemetry face sheet for immediately available ER MD    Location [facility]-Cardiac & Pulmonary Rehab    Staff Present Kelly Bollinger, MPA, RN;Kelly Hayes, BS, ACSM CEP, Exercise Physiologist;Kara Langdon, MS Exercise Physiologist    Virtual Visit No    Medication changes reported     No    Fall or balance concerns reported    No    Warm-up and Cool-down Performed on first and last piece of equipment    Resistance Training Performed Yes    VAD Patient? No    PAD/SET Patient? No      Pain Assessment   Currently in Pain? No/denies              Social History   Tobacco Use  Smoking Status Former Smoker  . Quit date: 2000  . Years since quitting: 22.1  Smokeless Tobacco Never Used    Goals Met:  Independence with exercise equipment Exercise tolerated well No report of cardiac concerns or symptoms Strength training completed today  Goals Unmet:  Not Applicable  Comments: Pt able to follow exercise prescription today without complaint.  Will continue to monitor for progression.    Dr. Mark Miller is Medical Director for [facility] and [facility]. 

## 2021-02-22 ENCOUNTER — Encounter: Payer: Self-pay | Admitting: *Deleted

## 2021-02-22 ENCOUNTER — Other Ambulatory Visit: Payer: Self-pay

## 2021-02-22 DIAGNOSIS — Z955 Presence of coronary angioplasty implant and graft: Secondary | ICD-10-CM

## 2021-02-22 DIAGNOSIS — I214 Non-ST elevation (NSTEMI) myocardial infarction: Secondary | ICD-10-CM

## 2021-02-22 NOTE — Progress Notes (Signed)
Cardiac Individual Treatment Plan  Patient Details  Name: Carl Townsend MRN: [id number] Date of Birth: [birth year] Referring Provider:   Flowsheet Row Cardiac Rehab from 02/13/2021 in [facility] Cardiac and Pulmonary Rehab  Referring Provider [facility]      Initial Encounter Date:  Flowsheet Row Cardiac Rehab from 02/13/2021 in [facility] Cardiac and Pulmonary Rehab  Date 02/13/21      Visit Diagnosis: NSTEMI (non-ST elevated myocardial infarction) [facility])  Status post coronary artery stent placement  Patient's Home Medications on Admission:  Current Outpatient Medications:  .  albuterol (VENTOLIN HFA) 108 (90 Base) MCG/ACT inhaler, Inhale 2 puffs into the lungs every 4 (four) hours as needed for wheezing or shortness of breath., Disp: 1 each, Rfl: 0 .  ascorbic acid (VITAMIN C) 500 MG tablet, Take 500 mg by mouth daily., Disp: , Rfl:  .  aspirin 81 MG tablet, Take 81 mg by mouth daily., Disp: , Rfl:  .  atenolol (TENORMIN) 50 MG tablet, Take 50 mg by mouth at bedtime., Disp: , Rfl:  .  atorvastatin (LIPITOR) 80 MG tablet, Take 1 tablet (80 mg total) by mouth at bedtime., Disp: 30 tablet, Rfl: 2 .  budesonide (PULMICORT) 1 MG/2ML nebulizer solution, Inhale 2 mLs into the lungs daily., Disp: , Rfl:  .  candesartan (ATACAND) 16 MG tablet, Take 16 mg by mouth daily., Disp: , Rfl:  .  Carboxymethylcellulose Sodium (REFRESH LIQUIGEL) 1 % GEL, Place 1 drop into both eyes at bedtime. , Disp: , Rfl:  .  cyanocobalamin 1000 MCG tablet, Take 1,000 mcg by mouth daily., Disp: , Rfl:  .  ergocalciferol (VITAMIN D2) 1.25 MG (50000 UT) capsule, Take 50,000 Units by mouth once a week. (Patient not taking: Reported on 02/10/2021), Disp: , Rfl:  .  Glycerin-Polysorbate 80 (REFRESH DRY EYE THERAPY OP), Place 1 drop into both eyes daily as needed (dry eye). (Patient not taking: Reported on 02/10/2021), Disp: , Rfl:  .  Multiple Vitamin (MULTIVITAMIN) tablet, Take 1 tablet by mouth daily., Disp: , Rfl:  .   nitroGLYCERIN (NITROSTAT) 0.4 MG SL tablet, Place 1 tablet (0.4 mg total) under the tongue every 5 (five) minutes as needed., Disp: 25 tablet, Rfl: 2 .  sildenafil (VIAGRA) 100 MG tablet, Take 100 mg by mouth daily as needed for erectile dysfunction., Disp: , Rfl:  .  ticagrelor (BRILINTA) 90 MG TABS tablet, Take 1 tablet (90 mg total) by mouth 2 (two) times daily., Disp: 60 tablet, Rfl: 2 .  zinc gluconate 50 MG tablet, Take 50 mg by mouth daily., Disp: , Rfl:   Past Medical History: Past Medical History:  Diagnosis Date  . Arthritis    neck and shoulders  . Chronic kidney disease    chronic kidney disease - stage 3, GFR 30-59 ml/min  . Colon cancer ([place])   . ED (erectile dysfunction)   . History of 2019 novel coronavirus disease (COVID-19) 08/23/2020  . Hyperlipidemia   . Hypertension   . Inguinal hernia   . Pre-diabetes     Tobacco Use: Social History   Tobacco Use  Smoking Status Former Smoker  . Quit date: 2000  . Years since quitting: 22.1  Smokeless Tobacco Never Used    Labs: Recent Chemical engineer    Labs for ITP Cardiac and Pulmonary Rehab Latest Ref Rng & Units 01/11/2021 01/12/2021   Cholestrol 0 - 200 mg/dL - 147   LDLCALC 0 - 99 mg/dL - 87   HDL >40 mg/dL - 45  Trlycerides <150 mg/dL - 77   Hemoglobin A1c 4.8 - 5.6 % 5.2 -       Exercise Target Goals: Exercise Program Goal: Individual exercise prescription set using results from initial 6 min walk test and THRR while considering  patient's activity barriers and safety.   Exercise Prescription Goal: Initial exercise prescription builds to 30-45 minutes a day of aerobic activity, 2-3 days per week.  Home exercise guidelines will be given to patient during program as part of exercise prescription that the participant will acknowledge.   Education: Aerobic Exercise: - Group verbal and visual presentation on the components of exercise prescription. Introduces F.I.T.T principle from ACSM for exercise  prescriptions.  Reviews F.I.T.T. principles of aerobic exercise including progression. Written material given at graduation.   Education: Resistance Exercise: - Group verbal and visual presentation on the components of exercise prescription. Introduces F.I.T.T principle from ACSM for exercise prescriptions  Reviews F.I.T.T. principles of resistance exercise including progression. Written material given at graduation.    Education: Exercise & Equipment Safety: - Individual verbal instruction and demonstration of equipment use and safety with use of the equipment. Flowsheet Row Cardiac Rehab from 02/15/2021 in [facility] Cardiac and Pulmonary Rehab  Date 02/13/21  Educator AS  Instruction Review Code 1- Verbalizes Understanding      Education: Exercise Physiology & General Exercise Guidelines: - Group verbal and written instruction with models to review the exercise physiology of the cardiovascular system and associated critical values. Provides general exercise guidelines with specific guidelines to those with heart or lung disease.    Education: Flexibility, Balance, Mind/Body Relaxation: - Group verbal and visual presentation with interactive activity on the components of exercise prescription. Introduces F.I.T.T principle from ACSM for exercise prescriptions. Reviews F.I.T.T. principles of flexibility and balance exercise training including progression. Also discusses the mind body connection.  Reviews various relaxation techniques to help reduce and manage stress (i.e. Deep breathing, progressive muscle relaxation, and visualization). Balance handout provided to take home. Written material given at graduation.   Activity Barriers & Risk Stratification:  Activity Barriers & Cardiac Risk Stratification - 02/10/21 1306      Activity Barriers & Cardiac Risk Stratification   Activity Barriers Back Problems;Arthritis;Neck/Spine Problems;Other (comment)   arthritis in back and neck   Comments  numbness in both feet related to arthritis    Cardiac Risk Stratification High           6 Minute Walk:  6 Minute Walk    Row Name 02/13/21 1505         6 Minute Walk   Phase Initial     Distance 900 feet     Walk Time 6 minutes     # of Rest Breaks 0     MPH 1.7     METS 2.82     RPE 11     Perceived Dyspnea  1     VO2 Peak 9.9     Symptoms No     Resting HR 88 bpm     Resting BP 128/66     Resting Oxygen Saturation  96 %     Exercise Oxygen Saturation  during 6 min walk 95 %     Max Ex. HR 110 bpm     Max Ex. BP 154/74     2 Minute Post BP 124/64            Oxygen Initial Assessment:   Oxygen Re-Evaluation:   Oxygen Discharge (Final Oxygen Re-Evaluation):   Initial  Exercise Prescription:  Initial Exercise Prescription - 02/13/21 1500      Date of Initial Exercise RX and Referring Provider   Date 02/13/21    Referring Provider [facility]      Treadmill   MPH 1.7    Grade 1.5    Minutes 15    METs 2.5      Recumbant Bike   Level 2    RPM 60    Minutes 15    METs 2.8      NuStep   Level 2    SPM 80    Minutes 15    METs 2.8      T5 Nustep   Level 1    SPM 80    Minutes 15    METs 2.8      Biostep-RELP   Level 2    SPM 50    Minutes 15    METs 2.5      Prescription Details   Frequency (times per week) 3    Duration Progress to 30 minutes of continuous aerobic without signs/symptoms of physical distress      Intensity   THRR 40-80% of Max Heartrate 111-134    Ratings of Perceived Exertion 11-13    Perceived Dyspnea 0-4      Progression   Progression Continue to progress workloads to maintain intensity without signs/symptoms of physical distress.      Resistance Training   Training Prescription Yes    Weight 3 lb    Reps 10-15           Perform Capillary Blood Glucose checks as needed.  Exercise Prescription Changes:  Exercise Prescription Changes    Row Name 02/13/21 1500             Response to Exercise    Blood Pressure (Admit) 128/66       Blood Pressure (Exercise) 154/74       Blood Pressure (Exit) 124/64       Heart Rate (Admit) 88 bpm       Heart Rate (Exercise) 110 bpm       Heart Rate (Exit) 94 bpm       Oxygen Saturation (Admit) 96 %       Oxygen Saturation (Exercise) 95 %       Rating of Perceived Exertion (Exercise) 11       Perceived Dyspnea (Exercise) 1       Symptoms none              Exercise Comments:  Exercise Comments    Row Name 02/15/21 1328           Exercise Comments First full day of exercise!  Patient was oriented to gym and equipment including functions, settings, policies, and procedures.  Patient's individual exercise prescription and treatment plan were reviewed.  All starting workloads were established based on the results of the 6 minute walk test done at initial orientation visit.  The plan for exercise progression was also introduced and progression will be customized based on patient's performance and goals.              Exercise Goals and Review:  Exercise Goals    Row Name 02/13/21 1523             Exercise Goals   Increase Physical Activity Yes       Intervention Provide advice, education, support and counseling about physical activity/exercise needs.;Develop an individualized exercise prescription for aerobic and resistive training  based on initial evaluation findings, risk stratification, comorbidities and participant's personal goals.       Expected Outcomes Short Term: Attend rehab on a regular basis to increase amount of physical activity.;Long Term: Add in home exercise to make exercise part of routine and to increase amount of physical activity.;Long Term: Exercising regularly at least 3-5 days a week.       Increase Strength and Stamina Yes       Intervention Provide advice, education, support and counseling about physical activity/exercise needs.;Develop an individualized exercise prescription for aerobic and resistive training based  on initial evaluation findings, risk stratification, comorbidities and participant's personal goals.       Expected Outcomes Short Term: Increase workloads from initial exercise prescription for resistance, speed, and METs.;Short Term: Perform resistance training exercises routinely during rehab and add in resistance training at home;Long Term: Improve cardiorespiratory fitness, muscular endurance and strength as measured by increased METs and functional capacity (6MWT)       Able to understand and use rate of perceived exertion (RPE) scale Yes       Intervention Provide education and explanation on how to use RPE scale       Expected Outcomes Short Term: Able to use RPE daily in rehab to express subjective intensity level;Long Term:  Able to use RPE to guide intensity level when exercising independently       Able to understand and use Dyspnea scale Yes       Intervention Provide education and explanation on how to use Dyspnea scale       Expected Outcomes Short Term: Able to use Dyspnea scale daily in rehab to express subjective sense of shortness of breath during exertion;Long Term: Able to use Dyspnea scale to guide intensity level when exercising independently       Knowledge and understanding of Target Heart Rate Range (THRR) Yes       Intervention Provide education and explanation of THRR including how the numbers were predicted and where they are located for reference       Expected Outcomes Short Term: Able to state/look up THRR;Short Term: Able to use daily as guideline for intensity in rehab;Long Term: Able to use THRR to govern intensity when exercising independently       Able to check pulse independently Yes       Intervention Provide education and demonstration on how to check pulse in carotid and radial arteries.;Review the importance of being able to check your own pulse for safety during independent exercise       Expected Outcomes Short Term: Able to explain why pulse checking is  important during independent exercise;Long Term: Able to check pulse independently and accurately       Understanding of Exercise Prescription Yes       Intervention Provide education, explanation, and written materials on patient's individual exercise prescription       Expected Outcomes Short Term: Able to explain program exercise prescription;Long Term: Able to explain home exercise prescription to exercise independently              Exercise Goals Re-Evaluation :  Exercise Goals Re-Evaluation    Row Name 02/15/21 1328             Exercise Goal Re-Evaluation   Exercise Goals Review Increase Physical Activity;Able to understand and use rate of perceived exertion (RPE) scale;Knowledge and understanding of Target Heart Rate Range (THRR);Understanding of Exercise Prescription;Increase Strength and Stamina;Able to understand and use Dyspnea scale;Able to  check pulse independently       Comments Reviewed RPE and dyspnea scales, THR and program prescription with pt today.  Pt voiced understanding and was given a copy of goals to take home.       Expected Outcomes Short: Use RPE daily to regulate intensity. Long: Follow program prescription in THR.              Discharge Exercise Prescription (Final Exercise Prescription Changes):  Exercise Prescription Changes - 02/13/21 1500      Response to Exercise   Blood Pressure (Admit) 128/66    Blood Pressure (Exercise) 154/74    Blood Pressure (Exit) 124/64    Heart Rate (Admit) 88 bpm    Heart Rate (Exercise) 110 bpm    Heart Rate (Exit) 94 bpm    Oxygen Saturation (Admit) 96 %    Oxygen Saturation (Exercise) 95 %    Rating of Perceived Exertion (Exercise) 11    Perceived Dyspnea (Exercise) 1    Symptoms none           Nutrition:  Target Goals: Understanding of nutrition guidelines, daily intake of sodium 1500mg , cholesterol 200mg , calories 30% from fat and 7% or less from saturated fats, daily to have 5 or more servings of  fruits and vegetables.  Education: All About Nutrition: -Group instruction provided by verbal, written material, interactive activities, discussions, models, and posters to present general guidelines for heart healthy nutrition including fat, fiber, MyPlate, the role of sodium in heart healthy nutrition, utilization of the nutrition label, and utilization of this knowledge for meal planning. Follow up email sent as well. Written material given at graduation.   Biometrics:  Pre Biometrics - 02/13/21 1524      Pre Biometrics   Height 5\' 10"  (1.778 m)    Weight 136 lb (61.7 kg)    BMI (Calculated) 19.51    Single Leg Stand 3 seconds            Nutrition Therapy Plan and Nutrition Goals:  Nutrition Therapy & Goals - 02/13/21 1428      Nutrition Therapy   Diet Heart healthy, low Na    Drug/Food Interactions Statins/Certain Fruits    Protein (specify units) 70g    Fiber 30 grams    Whole Grain Foods 3 servings    Saturated Fats 12 max. grams    Fruits and Vegetables 8 servings/day    Sodium 1.5 grams      Personal Nutrition Goals   Nutrition Goal ST: add nuts or peanut butter to breakfast, add in protein snack like boiled egg, try whole wheat bread LT: gain back muscle    Comments His wife is a Biomedical scientist and she cooks low salt, baked, limits fried food. He will have cereal during the week. Wife uses olive oil when cooking. He eats fish 1x/week, cut down on red meat, pork chop baked once in awhile, will eat baked chicken. He likes mashed potatoes, green bean, peas, and fruit. B: cereal (cheerios) - whole milk with some fruit. Green tea (lemon and honey). D: meat, vegetables (starch/non-starchy). He uses white wheat bread, but will try whole wheat bread, he also like whole wheat pasta and brown rice. He reports his weight has gone down ~6lbs since heart event due to low appetite. Discussed heart healthy eating and how to increase calories and protein to gain back muscle.      Intervention  Plan   Intervention Prescribe, educate and counsel regarding individualized specific dietary modifications aiming  towards targeted core components such as weight, hypertension, lipid management, diabetes, heart failure and other comorbidities.;Nutrition handout(s) given to patient.    Expected Outcomes Short Term Goal: Understand basic principles of dietary content, such as calories, fat, sodium, cholesterol and nutrients.;Short Term Goal: A plan has been developed with personal nutrition goals set during dietitian appointment.;Long Term Goal: Adherence to prescribed nutrition plan.           Nutrition Assessments:  MEDIFICTS Score Key:  ?70 Need to make dietary changes   40-70 Heart Healthy Diet  ? 40 Therapeutic Level Cholesterol Diet  Flowsheet Row Cardiac Rehab from 02/13/2021 in [facility] Cardiac and Pulmonary Rehab  Picture Your Plate Total Score on Admission 60     Picture Your Plate Scores:  <16 Unhealthy dietary pattern with much room for improvement.  41-50 Dietary pattern unlikely to meet recommendations for good health and room for improvement.  51-60 More healthful dietary pattern, with some room for improvement.   >60 Healthy dietary pattern, although there may be some specific behaviors that could be improved.    Nutrition Goals Re-Evaluation:   Nutrition Goals Discharge (Final Nutrition Goals Re-Evaluation):   Psychosocial: Target Goals: Acknowledge presence or absence of significant depression and/or stress, maximize coping skills, provide positive support system. Participant is able to verbalize types and ability to use techniques and skills needed for reducing stress and depression.   Education: Stress, Anxiety, and Depression - Group verbal and visual presentation to define topics covered.  Reviews how body is impacted by stress, anxiety, and depression.  Also discusses healthy ways to reduce stress and to treat/manage anxiety and depression.  Written material  given at graduation.   Education: Sleep Hygiene -Provides group verbal and written instruction about how sleep can affect your health.  Define sleep hygiene, discuss sleep cycles and impact of sleep habits. Review good sleep hygiene tips.    Initial Review & Psychosocial Screening:  Initial Psych Review & Screening - 02/10/21 1308      Initial Review   Current issues with Current Stress Concerns    Source of Stress Concerns Unable to participate in former interests or hobbies;Unable to perform yard/household activities      [place]? Yes   wife     Barriers   Psychosocial barriers to participate in program There are no identifiable barriers or psychosocial needs.;The patient should benefit from training in stress management and relaxation.      Screening Interventions   Interventions Encouraged to exercise;Provide feedback about the scores to participant;To provide support and resources with identified psychosocial needs    Expected Outcomes Short Term goal: Utilizing psychosocial counselor, staff and physician to assist with identification of specific Stressors or current issues interfering with healing process. Setting desired goal for each stressor or current issue identified.;Long Term Goal: Stressors or current issues are controlled or eliminated.;Short Term goal: Identification and review with participant of any Quality of Life or Depression concerns found by scoring the questionnaire.;Long Term goal: The participant improves quality of Life and PHQ9 Scores as seen by post scores and/or verbalization of changes           Quality of Life Scores:   Quality of Life - 02/13/21 1529      Quality of Life   Select Quality of Life      Quality of Life Scores   Health/Function Pre 20.93 %    Socioeconomic Pre 23.5 %    Psych/Spiritual Pre 29.29 %  Family Pre 20.5 %    GLOBAL Pre 23.19 %          Scores of 19 and below usually indicate a poorer  quality of life in these areas.  A difference of  2-3 points is a clinically meaningful difference.  A difference of 2-3 points in the total score of the Quality of Life Index has been associated with significant improvement in overall quality of life, self-image, physical symptoms, and general health in studies assessing change in quality of life.  PHQ-9: Recent Review Flowsheet Data    Depression screen [facility] 2/9 02/13/2021   Decreased Interest 0   Down, Depressed, Hopeless 0   PHQ - 2 Score 0   Altered sleeping 0   Tired, decreased energy 1   Change in appetite 1   Feeling bad or failure about yourself  0   Trouble concentrating 0   Moving slowly or fidgety/restless 0   Suicidal thoughts 0   PHQ-9 Score 2   Difficult doing work/chores Not difficult at all     Interpretation of Total Score  Total Score Depression Severity:  1-4 = Minimal depression, 5-9 = Mild depression, 10-14 = Moderate depression, 15-19 = Moderately severe depression, 20-27 = Severe depression   Psychosocial Evaluation and Intervention:  Psychosocial Evaluation - 02/10/21 1312      Psychosocial Evaluation & Interventions   Interventions Encouraged to exercise with the program and follow exercise prescription    Comments Mr. Vasek reports doing well after his NSTEMI. He had some chest discomfort soon after discharge that resulted in him taking nitro and led to low blood pressure. Thankfully he hasn't had any more since then and has been feeling better after some medication adjustment. His biggest stressor right now is building back up his stamina so he is excited to start cardiac rehab. His wife is his main support system and feels like they are doing well working together after his MI. He is retired but keeps to the same sleep schedule of going to bed late and waking up later.    Expected Outcomes Short: attend cardiac rehab for education and exercise. Long: develop and maintain positive self care habits.     Continue Psychosocial Services  Follow up required by staff           Psychosocial Re-Evaluation:   Psychosocial Discharge (Final Psychosocial Re-Evaluation):   Vocational Rehabilitation: Provide vocational rehab assistance to qualifying candidates.   Vocational Rehab Evaluation & Intervention:  Vocational Rehab - 02/10/21 1305      Initial Vocational Rehab Evaluation & Intervention   Assessment shows need for Vocational Rehabilitation No           Education: Education Goals: Education classes will be provided on a variety of topics geared toward better understanding of heart health and risk factor modification. Participant will state understanding/return demonstration of topics presented as noted by education test scores.  Learning Barriers/Preferences:  Learning Barriers/Preferences - 02/10/21 1305      Learning Barriers/Preferences   Learning Barriers None    Learning Preferences None           General Cardiac Education Topics:  AED/CPR: - Group verbal and written instruction with the use of models to demonstrate the basic use of the AED with the basic ABC's of resuscitation.   Anatomy and Cardiac Procedures: - Group verbal and visual presentation and models provide information about basic cardiac anatomy and function. Reviews the testing methods done to diagnose heart disease and the  outcomes of the test results. Describes the treatment choices: Medical Management, Angioplasty, or Coronary Bypass Surgery for treating various heart conditions including Myocardial Infarction, Angina, Valve Disease, and Cardiac Arrhythmias.  Written material given at graduation.   Medication Safety: - Group verbal and visual instruction to review commonly prescribed medications for heart and lung disease. Reviews the medication, class of the drug, and side effects. Includes the steps to properly store meds and maintain the prescription regimen.  Written material given at  graduation.   Intimacy: - Group verbal instruction through game format to discuss how heart and lung disease can affect sexual intimacy. Written material given at graduation..   Know Your Numbers and Heart Failure: - Group verbal and visual instruction to discuss disease risk factors for cardiac and pulmonary disease and treatment options.  Reviews associated critical values for Overweight/Obesity, Hypertension, Cholesterol, and Diabetes.  Discusses basics of heart failure: signs/symptoms and treatments.  Introduces Heart Failure Zone chart for action plan for heart failure.  Written material given at graduation.   Infection Prevention: - Provides verbal and written material to individual with discussion of infection control including proper hand washing and proper equipment cleaning during exercise session. Flowsheet Row Cardiac Rehab from 02/15/2021 in [facility] [facility] Cardiac and Pulmonary Rehab  Date 02/13/21  Educator AS  Instruction Review Code 1- Verbalizes Understanding      Falls Prevention: - Provides verbal and written material to individual with discussion of falls prevention and safety. Flowsheet Row Cardiac Rehab from 02/15/2021 in [facility] Cardiac and Pulmonary Rehab  Date 02/13/21  Educator AS  Instruction Review Code 1- Verbalizes Understanding      Other: -Provides group and verbal instruction on various topics (see comments)   Knowledge Questionnaire Score:  Knowledge Questionnaire Score - 02/13/21 1527      Knowledge Questionnaire Score   Pre Score 21/26 heart disease /exercise           Core Components/Risk Factors/Patient Goals at Admission:  Personal Goals and Risk Factors at Admission - 02/13/21 1525      Core Components/Risk Factors/Patient Goals on Admission    Weight Management Yes;Weight Gain    Admit Weight 136 lb (61.7 kg)    Goal Weight: Short Term 140 lb (63.5 kg)    Goal Weight: Long Term 145 lb (65.8 kg)    Expected Outcomes Short Term: Continue to  assess and modify interventions until short term weight is achieved;Long Term: Adherence to nutrition and physical activity/exercise program aimed toward attainment of established weight goal;Weight Gain: Understanding of general recommendations for a high calorie, high protein meal plan that promotes weight gain by distributing calorie intake throughout the day with the consumption for 4-5 meals, snacks, and/or supplements    Hypertension Yes    Intervention Provide education on lifestyle modifcations including regular physical activity/exercise, weight management, moderate sodium restriction and increased consumption of fresh fruit, vegetables, and low fat dairy, alcohol moderation, and smoking cessation.;Monitor prescription use compliance.    Expected Outcomes Short Term: Continued assessment and intervention until BP is < 140/36mm HG in hypertensive participants. < 130/61mm HG in hypertensive participants with diabetes, heart failure or chronic kidney disease.;Long Term: Maintenance of blood pressure at goal levels.    Lipids Yes    Intervention Provide education and support for participant on nutrition & aerobic/resistive exercise along with prescribed medications to achieve LDL 70mg , HDL >40mg .    Expected Outcomes Short Term: Participant states understanding of desired cholesterol values and is compliant with medications prescribed. Participant is  following exercise prescription and nutrition guidelines.;Long Term: Cholesterol controlled with medications as prescribed, with individualized exercise RX and with personalized nutrition plan. Value goals: LDL < 70mg , HDL > 40 mg.           Education:Diabetes - Individual verbal and written instruction to review signs/symptoms of diabetes, desired ranges of glucose level fasting, after meals and with exercise. Acknowledge that pre and post exercise glucose checks will be done for 3 sessions at entry of program.   Core Components/Risk  Factors/Patient Goals Review:    Core Components/Risk Factors/Patient Goals at Discharge (Final Review):    ITP Comments:  ITP Comments    Row Name 02/10/21 1315 02/13/21 1538 02/15/21 1328 02/22/21 0718     ITP Comments Initial telephone orientation completed. Diagnosis can be found in [facility] 1/13. EP orientation scheduled for Monday 2/14 at 1pm. Completed 6MWT and gym orientation. Initial ITP created and sent for review to Dr. Emily Filbert, Medical Director. First full day of exercise!  Patient was oriented to gym and equipment including functions, settings, policies, and procedures.  Patient's individual exercise prescription and treatment plan were reviewed.  All starting workloads were established based on the results of the 6 minute walk test done at initial orientation visit.  The plan for exercise progression was also introduced and progression will be customized based on patient's performance and goals. 30 Day review completed. Medical Director ITP review done, changes made as directed, and signed approval by Medical Director.  New to program           Comments:

## 2021-02-22 NOTE — Progress Notes (Signed)
Daily Session Note  Patient Details  Name: MALLIE LINNEMANN MRN: [id number] Date of Birth: 21-Mar-1946 Referring Provider:   Flowsheet Row Cardiac Rehab from 02/13/2021 in [facility] Cardiac and Pulmonary Rehab  Referring Provider [facility]      Encounter Date: 02/22/2021  Check In:  Session Check In - 02/22/21 1342      Check-In   Supervising physician immediately available to respond to emergencies See telemetry face sheet for immediately available ER MD    Location [facility]-Cardiac & Pulmonary Rehab    Staff Present Birdie Sons, MPA, RN;Joseph Lou Miner, [place] Exercise Physiologist    Virtual Visit No    Medication changes reported     No    Fall or balance concerns reported    No    Warm-up and Cool-down Performed on first and last piece of equipment    Resistance Training Performed Yes    VAD Patient? No    PAD/SET Patient? No      Pain Assessment   Currently in Pain? No/denies              Social History   Tobacco Use  Smoking Status Former Smoker  . Quit date: 2000  . Years since quitting: 22.1  Smokeless Tobacco Never Used    Goals Met:  Independence with exercise equipment Exercise tolerated well No report of cardiac concerns or symptoms Strength training completed today  Goals Unmet:  Not Applicable  Comments: Pt able to follow exercise prescription today without complaint.  Will continue to monitor for progression.    Dr. Emily Filbert is Medical Director for [place] and [facility].

## 2021-02-27 ENCOUNTER — Encounter: Payer: Medicare Other | Admitting: *Deleted

## 2021-02-27 ENCOUNTER — Other Ambulatory Visit: Payer: Self-pay

## 2021-02-27 DIAGNOSIS — I214 Non-ST elevation (NSTEMI) myocardial infarction: Secondary | ICD-10-CM

## 2021-02-27 DIAGNOSIS — Z955 Presence of coronary angioplasty implant and graft: Secondary | ICD-10-CM

## 2021-02-27 NOTE — Progress Notes (Signed)
Daily Session Note  Patient Details  Name: Carl Townsend MRN: [id number] Date of Birth: Jul 03, 1946 Referring Provider:   Flowsheet Row Cardiac Rehab from 02/13/2021 in [facility] Cardiac and Pulmonary Rehab  Referring Provider [facility]      Encounter Date: 02/27/2021  Check In:  Session Check In - 02/27/21 2778      Check-In   Supervising physician immediately available to respond to emergencies See telemetry face sheet for immediately available ER MD    Location [facility]-Cardiac & Pulmonary Rehab    Staff Present Heath Lark, RN, BSN, CCRP;Joseph Hood RCP,RRT,BSRT;Kelly [place], [place], ACSM CEP, Exercise Physiologist    Virtual Visit No    Medication changes reported     No    Fall or balance concerns reported    No    Warm-up and Cool-down Performed on first and last piece of equipment    Resistance Training Performed Yes    VAD Patient? No    PAD/SET Patient? No      Pain Assessment   Currently in Pain? No/denies              Social History   Tobacco Use  Smoking Status Former Smoker  . Quit date: 2000  . Years since quitting: 22.1  Smokeless Tobacco Never Used    Goals Met:  Independence with exercise equipment Exercise tolerated well No report of cardiac concerns or symptoms  Goals Unmet:  Not Applicable  Comments: Pt able to follow exercise prescription today without complaint.  Will continue to monitor for progression. Carl Townsend stated he had to take a ntg last night. Resolved his symptoms. He when asked stated that this is a regular occurance since he left the hospital. Reviewed the sign and symptoms of MI veresus angina. Steps he needs to follow if he uses NTG and it does not resolve the symptoms. Call 911!  He verbalized understanding. He does have an appt with his cardiologist next week and was advised to call his doctor before appoinment if symptoms persist.  No concerns today with angina symptoms  Dr. Emily Filbert is Medical Director for [place] and [facility].

## 2021-03-01 ENCOUNTER — Encounter: Payer: Medicare Other | Attending: Internal Medicine

## 2021-03-01 ENCOUNTER — Other Ambulatory Visit: Payer: Self-pay

## 2021-03-01 DIAGNOSIS — Z955 Presence of coronary angioplasty implant and graft: Secondary | ICD-10-CM | POA: Insufficient documentation

## 2021-03-01 DIAGNOSIS — I214 Non-ST elevation (NSTEMI) myocardial infarction: Secondary | ICD-10-CM | POA: Diagnosis present

## 2021-03-01 NOTE — Progress Notes (Signed)
Daily Session Note  Patient Details  Name: Carl Townsend MRN: [id number] Date of Birth: [birth year] Referring Provider:   Flowsheet Row Cardiac Rehab from 02/13/2021 in [facility] Cardiac and Pulmonary Rehab  Referring Provider [facility]      Encounter Date: 03/01/2021  Check In:  Session Check In - 03/01/21 [place]      Check-In   Supervising physician immediately available to respond to emergencies See telemetry face sheet for immediately available ER MD    Location [facility]-Cardiac & Pulmonary Rehab    Staff Present Birdie Sons, MPA, RN;Joseph Lou Miner, [place] Exercise Physiologist    Virtual Visit No    Medication changes reported     No    Fall or balance concerns reported    No    Warm-up and Cool-down Performed on first and last piece of equipment    Resistance Training Performed Yes    VAD Patient? No    PAD/SET Patient? No      Pain Assessment   Currently in Pain? No/denies              Social History   Tobacco Use  Smoking Status Former Smoker  . Quit date: 2000  . Years since quitting: 22.1  Smokeless Tobacco Never Used    Goals Met:  Independence with exercise equipment Exercise tolerated well No report of cardiac concerns or symptoms Strength training completed today  Goals Unmet:  Not Applicable  Comments: Pt able to follow exercise prescription today without complaint.  Will continue to monitor for progression.    Dr. Emily Filbert is Medical Director for [place] and [facility].

## 2021-03-02 ENCOUNTER — Other Ambulatory Visit: Payer: Self-pay

## 2021-03-02 ENCOUNTER — Encounter: Payer: Medicare Other | Admitting: *Deleted

## 2021-03-02 DIAGNOSIS — I214 Non-ST elevation (NSTEMI) myocardial infarction: Secondary | ICD-10-CM

## 2021-03-02 DIAGNOSIS — Z955 Presence of coronary angioplasty implant and graft: Secondary | ICD-10-CM

## 2021-03-02 IMAGING — US US EXTREM LOW VENOUS*L*
1 series · 13 of 24 positions shown · non-contrast
Comparison: None.

CLINICAL DATA: Left lower extremity pain and edema.



[Series 1: us venous img lower uni left (dvt) · portal-venous · 13 of 42 slices shown]
[im 1/42]
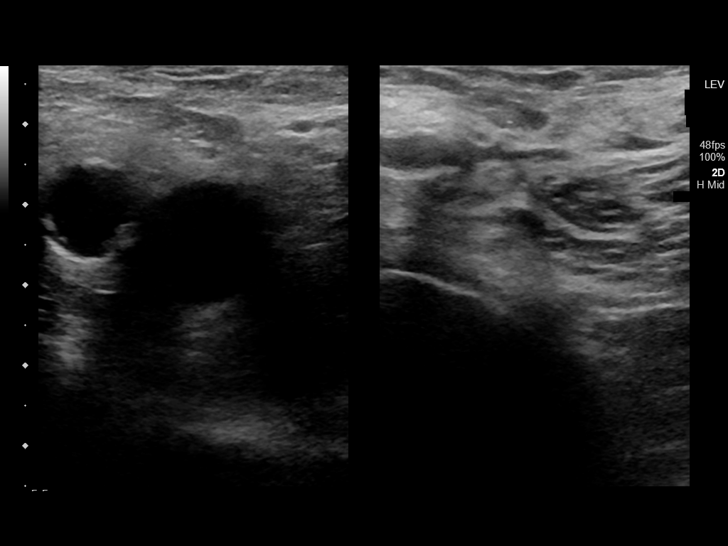
[im 4/42]
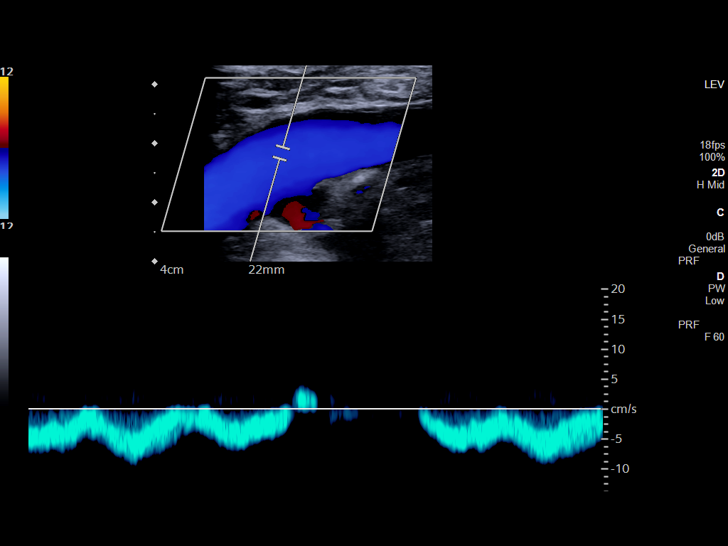
[im 8/42]
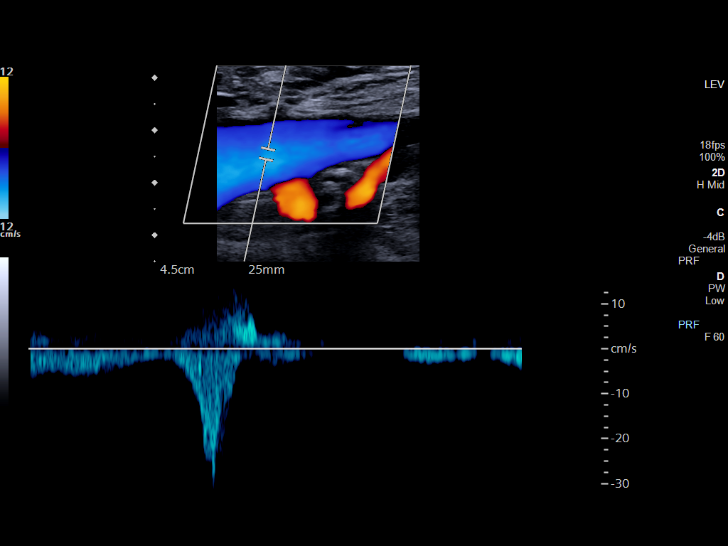
[im 11/42]
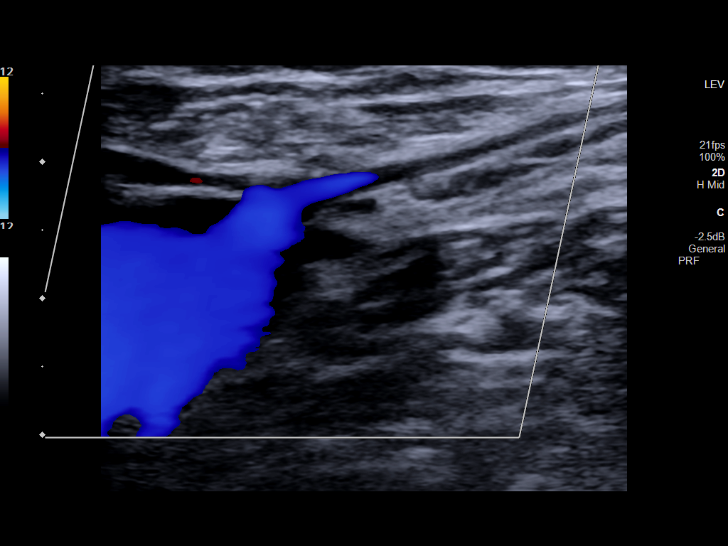
[im 15/42]
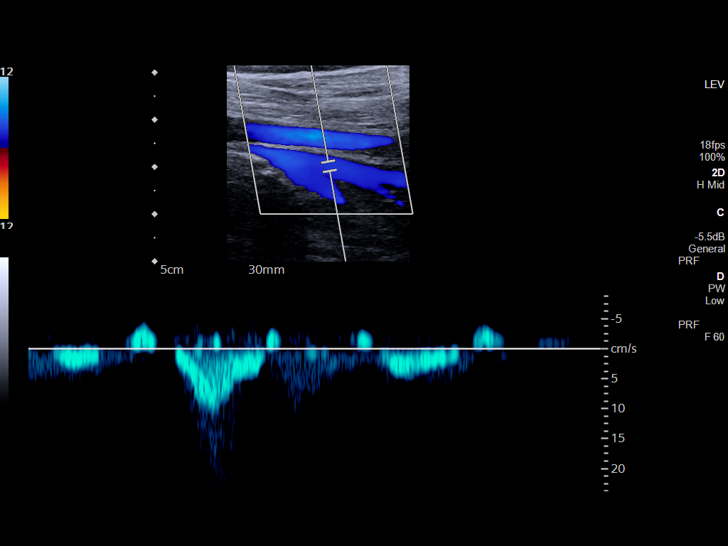
[im 18/42]
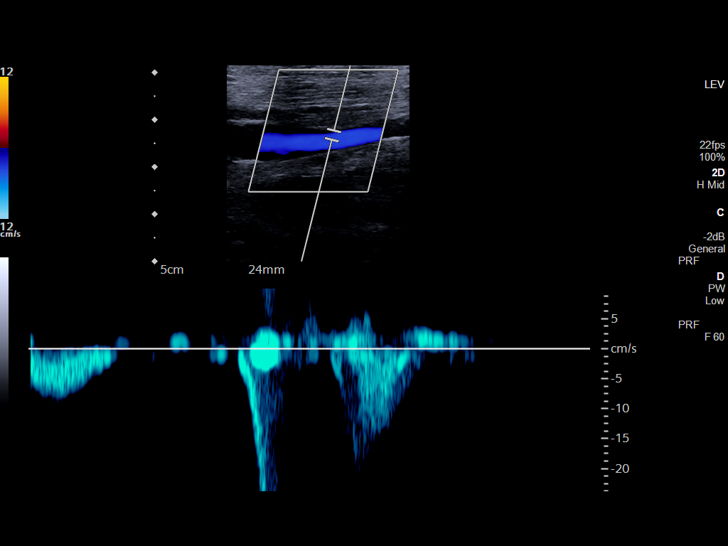
[im 22/42]
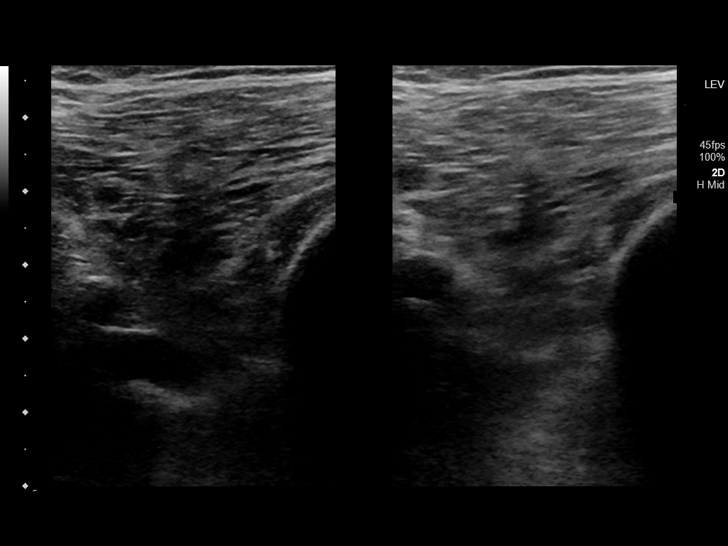
[im 24/42]
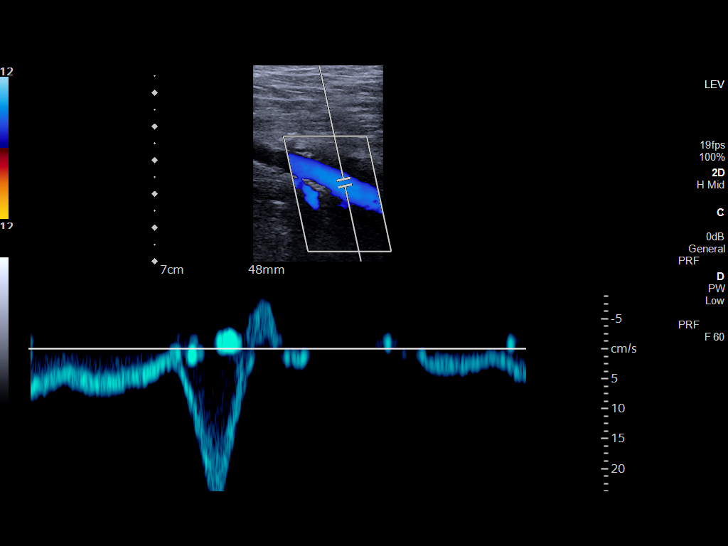
[im 27/42]
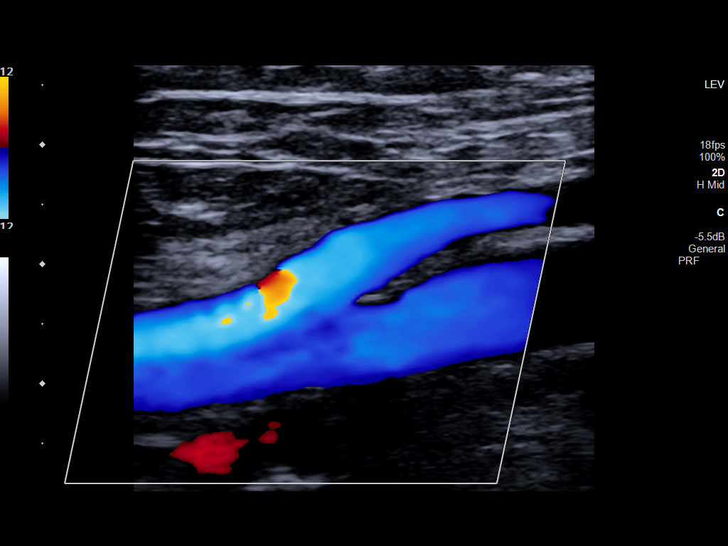
[im 31/42]
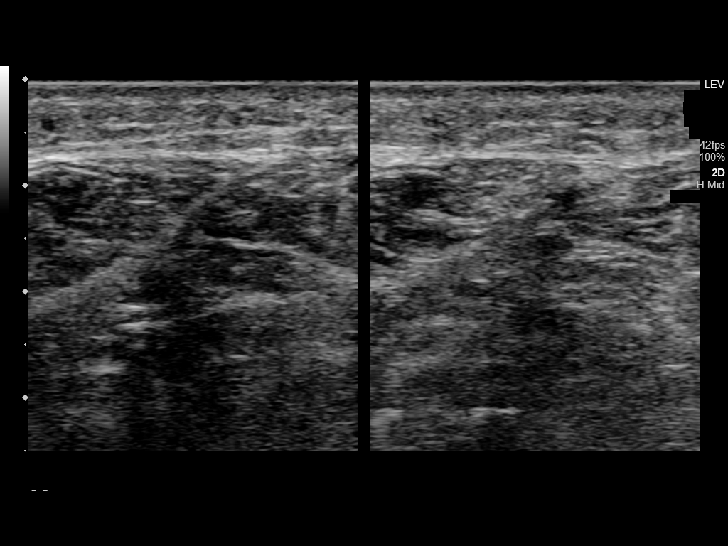
[im 34/42]
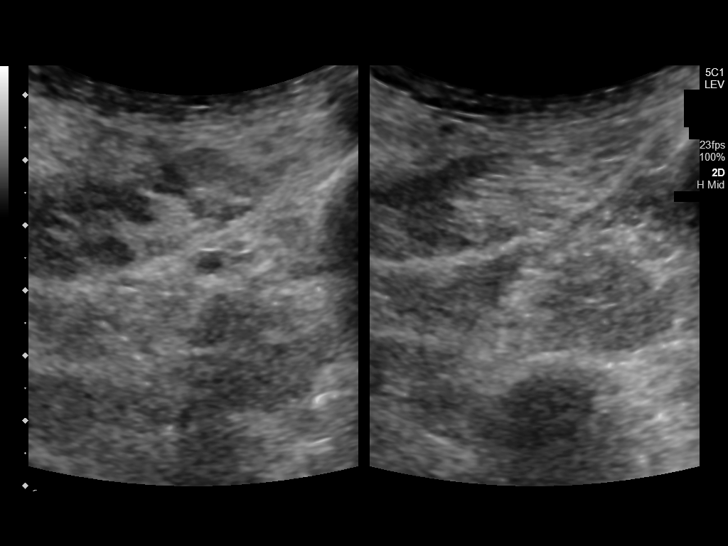
[im 38/42]
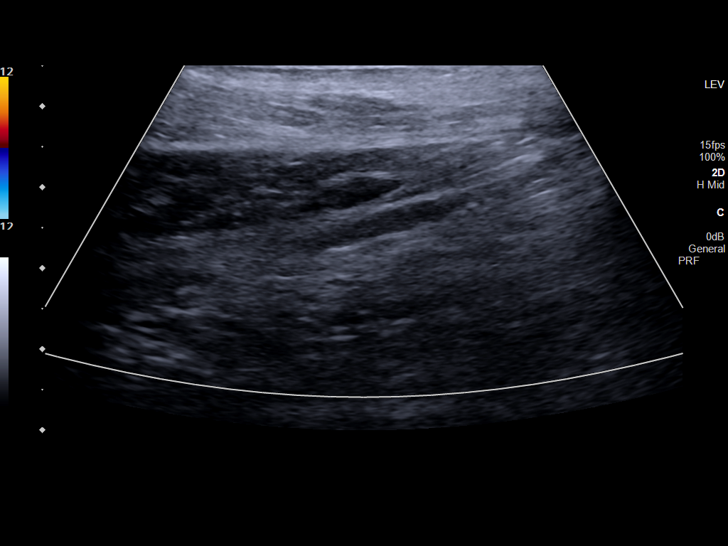
[im 42/42]
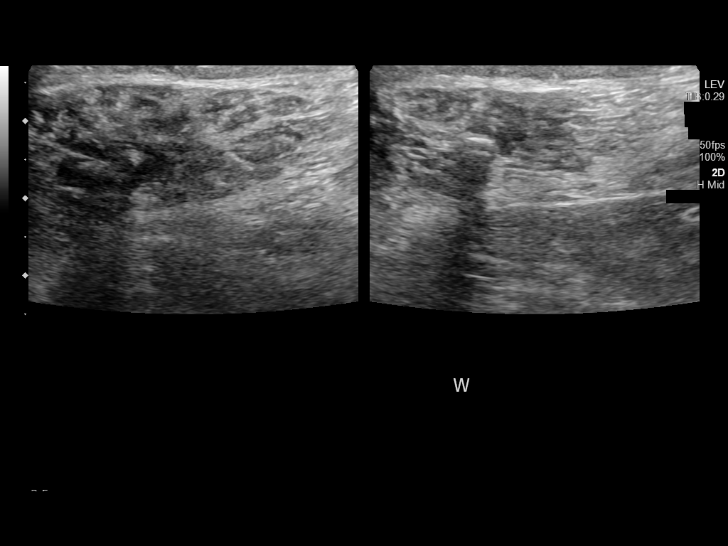

[13 of 24 positions shown; findings below may reference images not displayed]

FINDINGS: Contralateral Common Femoral Vein: Respiratory phasicity is normal
and symmetric with the symptomatic side. No evidence of thrombus.
Normal compressibility.

Common Femoral Vein: No evidence of thrombus. Normal
compressibility, respiratory phasicity and response to augmentation.

Saphenofemoral Junction: No evidence of thrombus. Normal
compressibility and flow on color Doppler imaging.

Profunda Femoral Vein: No evidence of thrombus. Normal
compressibility and flow on color Doppler imaging.

Femoral Vein: No evidence of thrombus. Normal compressibility,
respiratory phasicity and response to augmentation.

Popliteal Vein: No evidence of thrombus. Normal compressibility,
respiratory phasicity and response to augmentation.

Calf Veins: No evidence of thrombus. Normal compressibility and flow
on color Doppler imaging.

Superficial Great Saphenous Vein: No evidence of thrombus. Normal
compressibility.

Venous Reflux:  None.

Other Findings: No evidence of superficial thrombophlebitis or
abnormal fluid collection.
IMPRESSION: No evidence of left lower extremity deep venous thrombosis.

## 2021-03-02 NOTE — Progress Notes (Signed)
Daily Session Note  Patient Details  Name: Carl Townsend MRN: [id number] Date of Birth: 05-13-1946 Referring Provider:   Flowsheet Row Cardiac Rehab from 02/13/2021 in [facility] Cardiac and Pulmonary Rehab  Referring Provider [facility]      Encounter Date: 03/02/2021  Check In:  Session Check In - 03/02/21 1334      Check-In   Supervising physician immediately available to respond to emergencies See telemetry face sheet for immediately available ER MD    Location [facility]-Cardiac & Pulmonary Rehab    Staff Present Renita Papa, RN BSN;Joseph [address] [place], [place], [place], CCRP, CCET    Virtual Visit No    Medication changes reported     No    Fall or balance concerns reported    No    Warm-up and Cool-down Performed on first and last piece of equipment    Resistance Training Performed Yes    VAD Patient? No    PAD/SET Patient? No      Pain Assessment   Currently in Pain? No/denies              Social History   Tobacco Use  Smoking Status Former Smoker  . Quit date: 2000  . Years since quitting: 22.1  Smokeless Tobacco Never Used    Goals Met:  Proper associated with RPD/PD & O2 Sat Exercise tolerated well No report of cardiac concerns or symptoms Strength training completed today  Goals Unmet:  Not Applicable  Comments: Pt able to follow exercise prescription today without complaint.  Will continue to monitor for progression. Reviewed home exercise with pt today.  Pt plans to use his treadmill, bicycle, and YouTube videos for exercise.  Reviewed THR, pulse, RPE, sign and symptoms, pulse oximetery and when to call 911 or MD.  Also discussed weather considerations and indoor options.  Pt voiced understanding.     Dr. Emily Filbert is Medical Director for [place] and [facility].

## 2021-03-06 ENCOUNTER — Other Ambulatory Visit: Payer: Self-pay

## 2021-03-06 DIAGNOSIS — I214 Non-ST elevation (NSTEMI) myocardial infarction: Secondary | ICD-10-CM

## 2021-03-06 DIAGNOSIS — Z955 Presence of coronary angioplasty implant and graft: Secondary | ICD-10-CM

## 2021-03-06 NOTE — Progress Notes (Signed)
Daily Session Note  Patient Details  Name: Carl Townsend MRN: [id number] Date of Birth: [birth year] Referring Provider:   Flowsheet Row Cardiac Rehab from 02/13/2021 in [facility] Cardiac and Pulmonary Rehab  Referring Provider [facility]      Encounter Date: 03/06/2021  Check In:  Session Check In - 03/06/21 1342      Check-In   Supervising physician immediately available to respond to emergencies See telemetry face sheet for immediately available ER MD    Location [facility]-Cardiac & Pulmonary Rehab    Staff Present Birdie Sons, MPA, Mauricia Area, BS, ACSM CEP, Exercise Physiologist;Kara Eliezer Bottom, MS Exercise Physiologist    Virtual Visit No    Medication changes reported     No    Fall or balance concerns reported    No    Warm-up and Cool-down Performed on first and last piece of equipment    Resistance Training Performed Yes    VAD Patient? No    PAD/SET Patient? No      Pain Assessment   Currently in Pain? No/denies              Social History   Tobacco Use  Smoking Status Former Smoker  . Quit date: 2000  . Years since quitting: 22.1  Smokeless Tobacco Never Used    Goals Met:  Independence with exercise equipment Exercise tolerated well No report of cardiac concerns or symptoms Strength training completed today  Goals Unmet:  Not Applicable  Comments: Pt able to follow exercise prescription today without complaint.  Will continue to monitor for progression.    Dr. Emily Filbert is Medical Director for [place] and [facility].

## 2021-03-08 ENCOUNTER — Other Ambulatory Visit: Payer: Self-pay

## 2021-03-08 DIAGNOSIS — Z955 Presence of coronary angioplasty implant and graft: Secondary | ICD-10-CM

## 2021-03-08 DIAGNOSIS — I214 Non-ST elevation (NSTEMI) myocardial infarction: Secondary | ICD-10-CM | POA: Diagnosis not present

## 2021-03-08 NOTE — Progress Notes (Signed)
Daily Session Note  Patient Details  Name: Carl Townsend MRN: [id number] Date of Birth: [birth year] Referring Provider:   Flowsheet Row Cardiac Rehab from 02/13/2021 in [facility] Cardiac and Pulmonary Rehab  Referring Provider [facility]      Encounter Date: 03/08/2021  Check In:  Session Check In - 03/08/21 1340      Check-In   Supervising physician immediately available to respond to emergencies See telemetry face sheet for immediately available ER MD    Location [facility]-Cardiac & Pulmonary Rehab    Staff Present Birdie Sons, MPA, Elveria Rising, BA, ACSM CEP, Exercise Physiologist;Kara Eliezer Bottom, MS Exercise Physiologist;Meredith Sherryll Burger, RN BSN    Virtual Visit No    Medication changes reported     No    Fall or balance concerns reported    No    Warm-up and Cool-down Performed on first and last piece of equipment    Resistance Training Performed Yes    VAD Patient? No    PAD/SET Patient? No      Pain Assessment   Currently in Pain? No/denies              Social History   Tobacco Use  Smoking Status Former Smoker  . Quit date: 2000  . Years since quitting: 22.2  Smokeless Tobacco Never Used    Goals Met:  Independence with exercise equipment Exercise tolerated well Personal goals reviewed No report of cardiac concerns or symptoms Strength training completed today  Goals Unmet:  Not Applicable  Comments: Pt able to follow exercise prescription today without complaint.  Will continue to monitor for progression.    Dr. Emily Filbert is Medical Director for [place] and [facility].

## 2021-03-09 ENCOUNTER — Other Ambulatory Visit: Payer: Self-pay

## 2021-03-09 ENCOUNTER — Encounter: Payer: Medicare Other | Admitting: *Deleted

## 2021-03-09 DIAGNOSIS — Z955 Presence of coronary angioplasty implant and graft: Secondary | ICD-10-CM

## 2021-03-09 DIAGNOSIS — I214 Non-ST elevation (NSTEMI) myocardial infarction: Secondary | ICD-10-CM | POA: Diagnosis not present

## 2021-03-09 NOTE — Progress Notes (Signed)
Daily Session Note  Patient Details  Name: Carl Townsend MRN: [id number] Date of Birth: May 14, 1946 Referring Provider:   Flowsheet Row Cardiac Rehab from 02/13/2021 in [facility] Cardiac and Pulmonary Rehab  Referring Provider [facility]      Encounter Date: 03/09/2021  Check In:  Session Check In - 03/09/21 1335      Check-In   Supervising physician immediately available to respond to emergencies See telemetry face sheet for immediately available ER MD    Location [facility]-Cardiac & Pulmonary Rehab    Staff Present Renita Papa, RN BSN;Joseph [address] [place], [place], [place], CCRP, CCET    Virtual Visit No    Medication changes reported     No    Fall or balance concerns reported    No    Warm-up and Cool-down Performed on first and last piece of equipment    Resistance Training Performed Yes    VAD Patient? No    PAD/SET Patient? No      Pain Assessment   Currently in Pain? No/denies              Social History   Tobacco Use  Smoking Status Former Smoker  . Quit date: 2000  . Years since quitting: 22.2  Smokeless Tobacco Never Used    Goals Met:  Independence with exercise equipment Exercise tolerated well No report of cardiac concerns or symptoms Strength training completed today  Goals Unmet:  Not Applicable  Comments: Pt able to follow exercise prescription today without complaint.  Will continue to monitor for progression.    Dr. Emily Filbert is Medical Director for [place] and [facility].

## 2021-03-13 ENCOUNTER — Other Ambulatory Visit: Payer: Self-pay

## 2021-03-13 DIAGNOSIS — Z955 Presence of coronary angioplasty implant and graft: Secondary | ICD-10-CM

## 2021-03-13 DIAGNOSIS — I214 Non-ST elevation (NSTEMI) myocardial infarction: Secondary | ICD-10-CM

## 2021-03-13 NOTE — Progress Notes (Signed)
Daily Session Note  Patient Details  Name: Carl Townsend MRN: [id number] Date of Birth: Jul 08, 1946 Referring Provider:   Flowsheet Row Cardiac Rehab from 02/13/2021 in [facility] Cardiac and Pulmonary Rehab  Referring Provider [facility]      Encounter Date: 03/13/2021  Check In:  Session Check In - 03/13/21 1334      Check-In   Supervising physician immediately available to respond to emergencies See telemetry face sheet for immediately available ER MD    Location [facility]-Cardiac & Pulmonary Rehab    Staff Present Birdie Sons, MPA, Mauricia Area, BS, ACSM CEP, Exercise Physiologist;Kara Eliezer Bottom, MS Exercise Physiologist    Virtual Visit No    Medication changes reported     No    Fall or balance concerns reported    No    Warm-up and Cool-down Performed on first and last piece of equipment    Resistance Training Performed Yes    VAD Patient? No    PAD/SET Patient? No      Pain Assessment   Currently in Pain? No/denies              Social History   Tobacco Use  Smoking Status Former Smoker  . Quit date: 2000  . Years since quitting: 22.2  Smokeless Tobacco Never Used    Goals Met:  Independence with exercise equipment Exercise tolerated well No report of cardiac concerns or symptoms Strength training completed today  Goals Unmet:  Not Applicable  Comments: Pt able to follow exercise prescription today without complaint.  Will continue to monitor for progression.    Dr. Emily Filbert is Medical Director for [place] and [facility].

## 2021-03-15 ENCOUNTER — Other Ambulatory Visit: Payer: Self-pay

## 2021-03-15 DIAGNOSIS — Z955 Presence of coronary angioplasty implant and graft: Secondary | ICD-10-CM

## 2021-03-15 DIAGNOSIS — I214 Non-ST elevation (NSTEMI) myocardial infarction: Secondary | ICD-10-CM | POA: Diagnosis not present

## 2021-03-15 NOTE — Progress Notes (Signed)
Daily Session Note  Patient Details  Name: Carl Townsend MRN: [id number] Date of Birth: Apr 23, 1946 Referring Provider:   Flowsheet Row Cardiac Rehab from 02/13/2021 in [facility] Cardiac and Pulmonary Rehab  Referring Provider [facility]      Encounter Date: 03/15/2021  Check In:  Session Check In - 03/15/21 1351      Check-In   Supervising physician immediately available to respond to emergencies See telemetry face sheet for immediately available ER MD    Location [facility]-Cardiac & Pulmonary Rehab    Staff Present Birdie Sons, MPA, RN;Joseph Lou [place], [place] Exercise Physiologist    Virtual Visit No    Medication changes reported     No    Fall or balance concerns reported    No    Warm-up and Cool-down Performed on first and last piece of equipment    Resistance Training Performed Yes    VAD Patient? No    PAD/SET Patient? No      Pain Assessment   Currently in Pain? No/denies              Social History   Tobacco Use  Smoking Status Former Smoker  . Quit date: 2000  . Years since quitting: 22.2  Smokeless Tobacco Never Used    Goals Met:  Independence with exercise equipment Exercise tolerated well No report of cardiac concerns or symptoms Strength training completed today  Goals Unmet:  Not Applicable  Comments: Pt able to follow exercise prescription today without complaint.  Will continue to monitor for progression.    Dr. Emily Filbert is Medical Director for [place] and [facility].

## 2021-03-16 ENCOUNTER — Encounter: Payer: Medicare Other | Admitting: *Deleted

## 2021-03-16 ENCOUNTER — Other Ambulatory Visit: Payer: Self-pay

## 2021-03-16 DIAGNOSIS — Z955 Presence of coronary angioplasty implant and graft: Secondary | ICD-10-CM

## 2021-03-16 DIAGNOSIS — I214 Non-ST elevation (NSTEMI) myocardial infarction: Secondary | ICD-10-CM | POA: Diagnosis not present

## 2021-03-16 NOTE — Progress Notes (Signed)
Daily Session Note  Patient Details  Name: Carl Townsend MRN: [id number] Date of Birth: 01/11/1946 Referring Provider:   Flowsheet Row Cardiac Rehab from 02/13/2021 in [facility] Cardiac and Pulmonary Rehab  Referring Provider [place]      Encounter Date: 03/16/2021  Check In:  Session Check In - 03/16/21 1335      Check-In   Supervising physician immediately available to respond to emergencies See telemetry face sheet for immediately available ER MD    Location [facility]-Cardiac & Pulmonary Rehab    Staff Present Meredith Craven, RN BSN;Joseph Hood RCP,RRT,BSRT;Jessica Hawkins, MA, RCEP, CCRP, CCET    Virtual Visit No    Medication changes reported     No    Fall or balance concerns reported    No    Warm-up and Cool-down Performed on first and last piece of equipment    Resistance Training Performed Yes    VAD Patient? No    PAD/SET Patient? No      Pain Assessment   Currently in Pain? No/denies              Social History   Tobacco Use  Smoking Status Former Smoker  . Quit date: 2000  . Years since quitting: 22.2  Smokeless Tobacco Never Used    Goals Met:  Independence with exercise equipment Exercise tolerated well No report of cardiac concerns or symptoms Strength training completed today  Goals Unmet:  Not Applicable  Comments: Pt able to follow exercise prescription today without complaint.  Will continue to monitor for progression.    Dr. Mark Miller is Medical Director for [facility] and [facility]. 

## 2021-03-20 ENCOUNTER — Other Ambulatory Visit: Payer: Self-pay

## 2021-03-20 DIAGNOSIS — I214 Non-ST elevation (NSTEMI) myocardial infarction: Secondary | ICD-10-CM

## 2021-03-20 DIAGNOSIS — Z955 Presence of coronary angioplasty implant and graft: Secondary | ICD-10-CM

## 2021-03-20 NOTE — Progress Notes (Signed)
Daily Session Note  Patient Details  Name: Carl Townsend MRN: [id number] Date of Birth: 1946/04/29 Referring Provider:   Flowsheet Row Cardiac Rehab from 02/13/2021 in [facility] Cardiac and Pulmonary Rehab  Referring Provider [facility]      Encounter Date: 03/20/2021  Check In:  Session Check In - 03/20/21 [place]      Check-In   Supervising physician immediately available to respond to emergencies See telemetry face sheet for immediately available ER MD    Location [facility]-Cardiac & Pulmonary Rehab    Staff Present Birdie Sons, MPA, Mauricia Area, BS, ACSM CEP, Exercise Physiologist;Kara Eliezer Bottom, MS Exercise Physiologist    Virtual Visit No    Medication changes reported     No    Fall or balance concerns reported    No    Warm-up and Cool-down Performed on first and last piece of equipment    Resistance Training Performed Yes    VAD Patient? No    PAD/SET Patient? No      Pain Assessment   Currently in Pain? No/denies              Social History   Tobacco Use  Smoking Status Former Smoker  . Quit date: 2000  . Years since quitting: 22.2  Smokeless Tobacco Never Used    Goals Met:  Independence with exercise equipment Exercise tolerated well No report of cardiac concerns or symptoms Strength training completed today  Goals Unmet:  Not Applicable  Comments: Pt able to follow exercise prescription today without complaint.  Will continue to monitor for progression.    Dr. Emily Filbert is Medical Director for [place] and [facility].

## 2021-03-22 ENCOUNTER — Other Ambulatory Visit: Payer: Self-pay

## 2021-03-22 ENCOUNTER — Encounter: Payer: Self-pay | Admitting: *Deleted

## 2021-03-22 DIAGNOSIS — I214 Non-ST elevation (NSTEMI) myocardial infarction: Secondary | ICD-10-CM

## 2021-03-22 DIAGNOSIS — Z955 Presence of coronary angioplasty implant and graft: Secondary | ICD-10-CM

## 2021-03-22 NOTE — Progress Notes (Signed)
Cardiac Individual Treatment Plan  Patient Details  Name: Carl Townsend MRN: [id number] Date of Birth: [birth year] Referring Provider:   Flowsheet Row Cardiac Rehab from 02/13/2021 in [facility] Cardiac and Pulmonary Rehab  Referring Provider Detar North      Initial Encounter Date:  Flowsheet Row Cardiac Rehab from 02/13/2021 in [facility] Cardiac and Pulmonary Rehab  Date 02/13/21      Visit Diagnosis: No diagnosis found.  Patient's Home Medications on Admission:  Current Outpatient Medications:  .  albuterol (VENTOLIN HFA) 108 (90 Base) MCG/ACT inhaler, Inhale 2 puffs into the lungs every 4 (four) hours as needed for wheezing or shortness of breath., Disp: 1 each, Rfl: 0 .  ascorbic acid (VITAMIN C) 500 MG tablet, Take 500 mg by mouth daily., Disp: , Rfl:  .  aspirin 81 MG tablet, Take 81 mg by mouth daily., Disp: , Rfl:  .  atenolol (TENORMIN) 50 MG tablet, Take 50 mg by mouth at bedtime., Disp: , Rfl:  .  atorvastatin (LIPITOR) 80 MG tablet, Take 1 tablet (80 mg total) by mouth at bedtime., Disp: 30 tablet, Rfl: 2 .  budesonide (PULMICORT) 1 MG/2ML nebulizer solution, Inhale 2 mLs into the lungs daily., Disp: , Rfl:  .  candesartan (ATACAND) 16 MG tablet, Take 16 mg by mouth daily., Disp: , Rfl:  .  Carboxymethylcellulose Sodium (REFRESH LIQUIGEL) 1 % GEL, Place 1 drop into both eyes at bedtime. , Disp: , Rfl:  .  cyanocobalamin 1000 MCG tablet, Take 1,000 mcg by mouth daily., Disp: , Rfl:  .  ergocalciferol (VITAMIN D2) 1.25 MG (50000 UT) capsule, Take 50,000 Units by mouth once a week. (Patient not taking: Reported on 02/10/2021), Disp: , Rfl:  .  Glycerin-Polysorbate 80 (REFRESH DRY EYE THERAPY OP), Place 1 drop into both eyes daily as needed (dry eye). (Patient not taking: Reported on 02/10/2021), Disp: , Rfl:  .  Multiple Vitamin (MULTIVITAMIN) tablet, Take 1 tablet by mouth daily., Disp: , Rfl:  .  nitroGLYCERIN (NITROSTAT) 0.4 MG SL tablet, Place 1 tablet (0.4 mg total) under the  tongue every 5 (five) minutes as needed., Disp: 25 tablet, Rfl: 2 .  sildenafil (VIAGRA) 100 MG tablet, Take 100 mg by mouth daily as needed for erectile dysfunction., Disp: , Rfl:  .  ticagrelor (BRILINTA) 90 MG TABS tablet, Take 1 tablet (90 mg total) by mouth 2 (two) times daily., Disp: 60 tablet, Rfl: 2 .  zinc gluconate 50 MG tablet, Take 50 mg by mouth daily., Disp: , Rfl:   Past Medical History: Past Medical History:  Diagnosis Date  . Arthritis    neck and shoulders  . Chronic kidney disease    chronic kidney disease - stage 3, GFR 30-59 ml/min  . Colon cancer ([place])   . ED (erectile dysfunction)   . History of 2019 novel coronavirus disease (COVID-19) 08/23/2020  . Hyperlipidemia   . Hypertension   . Inguinal hernia   . Pre-diabetes     Tobacco Use: Social History   Tobacco Use  Smoking Status Former Smoker  . Quit date: 2000  . Years since quitting: 22.2  Smokeless Tobacco Never Used    Labs: Recent Chemical engineer    Labs for ITP Cardiac and Pulmonary Rehab Latest Ref Rng & Units 01/11/2021 01/12/2021   Cholestrol 0 - 200 mg/dL - 147   LDLCALC 0 - 99 mg/dL - 87   HDL >40 mg/dL - 45   Trlycerides <150 mg/dL - 77   Hemoglobin A1c  4.8 - 5.6 % 5.2 -       Exercise Target Goals: Exercise Program Goal: Individual exercise prescription set using results from initial 6 min walk test and THRR while considering  patient's activity barriers and safety.   Exercise Prescription Goal: Initial exercise prescription builds to 30-45 minutes a day of aerobic activity, 2-3 days per week.  Home exercise guidelines will be given to patient during program as part of exercise prescription that the participant will acknowledge.   Education: Aerobic Exercise: - Group verbal and visual presentation on the components of exercise prescription. Introduces F.I.T.T principle from ACSM for exercise prescriptions.  Reviews F.I.T.T. principles of aerobic exercise including  progression. Written material given at graduation.   Education: Resistance Exercise: - Group verbal and visual presentation on the components of exercise prescription. Introduces F.I.T.T principle from ACSM for exercise prescriptions  Reviews F.I.T.T. principles of resistance exercise including progression. Written material given at graduation. Flowsheet Row Cardiac Rehab from 03/15/2021 in [facility] Cardiac and Pulmonary Rehab  Date 02/22/21  Educator AS  Instruction Review Code 1- Verbalizes Understanding       Education: Exercise & Equipment Safety: - Individual verbal instruction and demonstration of equipment use and safety with use of the equipment. Flowsheet Row Cardiac Rehab from 03/15/2021 in [facility] Cardiac and Pulmonary Rehab  Date 02/13/21  Educator AS  Instruction Review Code 1- Verbalizes Understanding      Education: Exercise Physiology & General Exercise Guidelines: - Group verbal and written instruction with models to review the exercise physiology of the cardiovascular system and associated critical values. Provides general exercise guidelines with specific guidelines to those with heart or lung disease.    Education: Flexibility, Balance, Mind/Body Relaxation: - Group verbal and visual presentation with interactive activity on the components of exercise prescription. Introduces F.I.T.T principle from ACSM for exercise prescriptions. Reviews F.I.T.T. principles of flexibility and balance exercise training including progression. Also discusses the mind body connection.  Reviews various relaxation techniques to help reduce and manage stress (i.e. Deep breathing, progressive muscle relaxation, and visualization). Balance handout provided to take home. Written material given at graduation. Flowsheet Row Cardiac Rehab from 03/15/2021 in [facility] Cardiac and Pulmonary Rehab  Date 03/01/21  Educator AS  Instruction Review Code 1- Verbalizes Understanding      Activity Barriers & Risk  Stratification:  Activity Barriers & Cardiac Risk Stratification - 02/10/21 1306      Activity Barriers & Cardiac Risk Stratification   Activity Barriers Back Problems;Arthritis;Neck/Spine Problems;Other (comment)   arthritis in back and neck   Comments numbness in both feet related to arthritis    Cardiac Risk Stratification High           6 Minute Walk:  6 Minute Walk    Row Name 02/13/21 1505         6 Minute Walk   Phase Initial     Distance 900 feet     Walk Time 6 minutes     # of Rest Breaks 0     MPH 1.7     METS 2.82     RPE 11     Perceived Dyspnea  1     VO2 Peak 9.9     Symptoms No     Resting HR 88 bpm     Resting BP 128/66     Resting Oxygen Saturation  96 %     Exercise Oxygen Saturation  during 6 min walk 95 %     Max Ex. HR  110 bpm     Max Ex. BP 154/74     2 Minute Post BP 124/64            Oxygen Initial Assessment:   Oxygen Re-Evaluation:   Oxygen Discharge (Final Oxygen Re-Evaluation):   Initial Exercise Prescription:  Initial Exercise Prescription - 02/13/21 1500      Date of Initial Exercise RX and Referring Provider   Date 02/13/21    Referring Provider [facility]      Treadmill   MPH 1.7    Grade 1.5    Minutes 15    METs 2.5      Recumbant Bike   Level 2    RPM 60    Minutes 15    METs 2.8      NuStep   Level 2    SPM 80    Minutes 15    METs 2.8      T5 Nustep   Level 1    SPM 80    Minutes 15    METs 2.8      Biostep-RELP   Level 2    SPM 50    Minutes 15    METs 2.5      Prescription Details   Frequency (times per week) 3    Duration Progress to 30 minutes of continuous aerobic without signs/symptoms of physical distress      Intensity   THRR 40-80% of Max Heartrate 111-134    Ratings of Perceived Exertion 11-13    Perceived Dyspnea 0-4      Progression   Progression Continue to progress workloads to maintain intensity without signs/symptoms of physical distress.      Resistance Training    Training Prescription Yes    Weight 3 lb    Reps 10-15           Perform Capillary Blood Glucose checks as needed.  Exercise Prescription Changes:  Exercise Prescription Changes    Row Name 02/13/21 1500 02/27/21 1200 03/02/21 1300 03/15/21 1100       Response to Exercise   Blood Pressure (Admit) 128/66 122/72 -- 120/64    Blood Pressure (Exercise) 154/74 130/80 -- 134/60    Blood Pressure (Exit) 124/64 96/52 -- 104/56    Heart Rate (Admit) 88 bpm 79 bpm -- 88 bpm    Heart Rate (Exercise) 110 bpm 110 bpm -- 114 bpm    Heart Rate (Exit) 94 bpm 89 bpm -- 87 bpm    Oxygen Saturation (Admit) 96 % -- -- --    Oxygen Saturation (Exercise) 95 % -- -- --    Rating of Perceived Exertion (Exercise) 11 14 -- 12    Perceived Dyspnea (Exercise) 1 -- -- --    Symptoms none none -- none    Duration -- Continue with 30 min of aerobic exercise without signs/symptoms of physical distress. -- Continue with 30 min of aerobic exercise without signs/symptoms of physical distress.    Intensity -- THRR unchanged -- THRR unchanged         Progression   Progression -- Continue to progress workloads to maintain intensity without signs/symptoms of physical distress. -- Continue to progress workloads to maintain intensity without signs/symptoms of physical distress.    Average METs -- 2.8 -- 2.27         Resistance Training   Training Prescription -- Yes -- Yes    Weight -- 3 lb -- 3 lb    Reps -- 10-15 -- 10-15  Interval Training   Interval Training -- -- -- No         Treadmill   MPH -- -- -- 1.6    Grade -- -- -- 0.5    Minutes -- -- -- 15    METs -- -- -- 2.32         NuStep   Level -- 2 -- 2    SPM -- 80 -- --    Minutes -- 15 -- 15    METs -- 2.6 -- 2.5         Biostep-RELP   Level -- 1 -- 2    SPM -- 50 -- --    Minutes -- 15 -- 15    METs -- 3 -- 2         Home Exercise Plan   Plans to continue exercise at -- -- Home (comment)  treadmill, bike, youtube videos  Home (comment)  treadmill, bike, youtube videos    Frequency -- -- Add 2 additional days to program exercise sessions. Add 2 additional days to program exercise sessions.    Initial Home Exercises Provided -- -- 03/02/21 03/02/21           Exercise Comments:  Exercise Comments    Row Name 02/15/21 1328 02/27/21 0937 02/27/21 1018 03/02/21 1348     Exercise Comments First full day of exercise!  Patient was oriented to gym and equipment including functions, settings, policies, and procedures.  Patient's individual exercise prescription and treatment plan were reviewed.  All starting workloads were established based on the results of the 6 minute walk test done at initial orientation visit.  The plan for exercise progression was also introduced and progression will be customized based on patient's performance and goals. Carl Townsend stated he had to take a ntg last night. Resolved his symptoms. He when asked stated that this is a regular occurance since he left the hospital. Reviewed the sign and symptoms of MI veresus angina. Steps he needs to follow if he uses NTG and it does not resolve the symptoms. Call 911!  He verbalized understanding. He does have an appt with his cardiologist next week and was advised to call his doctor before appoinment if symptoms persist. No concerns today with angina symptoms Reviewed home exercise with pt today.  Pt plans to use his treadmill, bicycle, and YouTube videos for exercise.  Reviewed THR, pulse, RPE, sign and symptoms, pulse oximetery and when to call 911 or MD.  Also discussed weather considerations and indoor options.  Pt voiced understanding.           Exercise Goals and Review:  Exercise Goals    Row Name 02/13/21 1523             Exercise Goals   Increase Physical Activity Yes       Intervention Provide advice, education, support and counseling about physical activity/exercise needs.;Develop an individualized exercise prescription for aerobic and  resistive training based on initial evaluation findings, risk stratification, comorbidities and participant's personal goals.       Expected Outcomes Short Term: Attend rehab on a regular basis to increase amount of physical activity.;Long Term: Add in home exercise to make exercise part of routine and to increase amount of physical activity.;Long Term: Exercising regularly at least 3-5 days a week.       Increase Strength and Stamina Yes       Intervention Provide advice, education, support and counseling about physical activity/exercise needs.;Develop an individualized exercise  prescription for aerobic and resistive training based on initial evaluation findings, risk stratification, comorbidities and participant's personal goals.       Expected Outcomes Short Term: Increase workloads from initial exercise prescription for resistance, speed, and METs.;Short Term: Perform resistance training exercises routinely during rehab and add in resistance training at home;Long Term: Improve cardiorespiratory fitness, muscular endurance and strength as measured by increased METs and functional capacity (6MWT)       Able to understand and use rate of perceived exertion (RPE) scale Yes       Intervention Provide education and explanation on how to use RPE scale       Expected Outcomes Short Term: Able to use RPE daily in rehab to express subjective intensity level;Long Term:  Able to use RPE to guide intensity level when exercising independently       Able to understand and use Dyspnea scale Yes       Intervention Provide education and explanation on how to use Dyspnea scale       Expected Outcomes Short Term: Able to use Dyspnea scale daily in rehab to express subjective sense of shortness of breath during exertion;Long Term: Able to use Dyspnea scale to guide intensity level when exercising independently       Knowledge and understanding of Target Heart Rate Range (THRR) Yes       Intervention Provide education and  explanation of THRR including how the numbers were predicted and where they are located for reference       Expected Outcomes Short Term: Able to state/look up THRR;Short Term: Able to use daily as guideline for intensity in rehab;Long Term: Able to use THRR to govern intensity when exercising independently       Able to check pulse independently Yes       Intervention Provide education and demonstration on how to check pulse in carotid and radial arteries.;Review the importance of being able to check your own pulse for safety during independent exercise       Expected Outcomes Short Term: Able to explain why pulse checking is important during independent exercise;Long Term: Able to check pulse independently and accurately       Understanding of Exercise Prescription Yes       Intervention Provide education, explanation, and written materials on patient's individual exercise prescription       Expected Outcomes Short Term: Able to explain program exercise prescription;Long Term: Able to explain home exercise prescription to exercise independently              Exercise Goals Re-Evaluation :  Exercise Goals Re-Evaluation    [place] Name 02/15/21 1328 02/27/21 1244 03/02/21 1348 03/08/21 1337 03/15/21 1116     Exercise Goal Re-Evaluation   Exercise Goals Review Increase Physical Activity;Able to understand and use rate of perceived exertion (RPE) scale;Knowledge and understanding of Target Heart Rate Range (THRR);Understanding of Exercise Prescription;Increase Strength and Stamina;Able to understand and use Dyspnea scale;Able to check pulse independently Increase Physical Activity;Increase Strength and Stamina Increase Physical Activity;Increase Strength and Stamina;Able to understand and use rate of perceived exertion (RPE) scale;Knowledge and understanding of Target Heart Rate Range (THRR) Increase Physical Activity;Increase Strength and Stamina;Understanding of Exercise Prescription Increase Physical  Activity;Increase Strength and Stamina;Understanding of Exercise Prescription   Comments Reviewed RPE and dyspnea scales, THR and program prescription with pt today.  Pt voiced understanding and was given a copy of goals to take home. Carl Townsend is doing well in rehab.  He works at [employer].  Staff will monitor progress. Home exercise reviewed with patient. He plans to use his treadmill, bike, and YouTube videos to exercise at home. Patient voiced understanding of THRR and RPE scale Carl Townsend is doing well in rehab.  He is feeling pretty good overall.  He has gotten started with exercise at home and rescued the former clothing racks.  He has not noticed much change in his strength and stamina yet, but can tell that it is going to start getting better. Carl Townsend has been doing well in rehab.  He is up to 2.5 METs on the NuStep. He should be ready to start to increase his workloads.  We will continue to monitor his progress.   Expected Outcomes Short: Use RPE daily to regulate intensity. Long: Follow program prescription in THR. Short:  attend consistently Long:  improve overall stamina Short: add 2-3 days of exercise. Long: maintain independence in exercising on own. Short: Continue to add in exercise at home Long: Continue to improve stamina. Short: Increase workloads Long: Continue to improve stamina.          Discharge Exercise Prescription (Final Exercise Prescription Changes):  Exercise Prescription Changes - 03/15/21 1100      Response to Exercise   Blood Pressure (Admit) 120/64    Blood Pressure (Exercise) 134/60    Blood Pressure (Exit) 104/56    Heart Rate (Admit) 88 bpm    Heart Rate (Exercise) 114 bpm    Heart Rate (Exit) 87 bpm    Rating of Perceived Exertion (Exercise) 12    Symptoms none    Duration Continue with 30 min of aerobic exercise without signs/symptoms of physical distress.    Intensity THRR unchanged      Progression   Progression Continue to progress workloads to maintain  intensity without signs/symptoms of physical distress.    Average METs 2.27      Resistance Training   Training Prescription Yes    Weight 3 lb    Reps 10-15      Interval Training   Interval Training No      Treadmill   MPH 1.6    Grade 0.5    Minutes 15    METs 2.32      NuStep   Level 2    Minutes 15    METs 2.5      Biostep-RELP   Level 2    Minutes 15    METs 2      Home Exercise Plan   Plans to continue exercise at Home (comment)   treadmill, bike, youtube videos   Frequency Add 2 additional days to program exercise sessions.    Initial Home Exercises Provided 03/02/21           Nutrition:  Target Goals: Understanding of nutrition guidelines, daily intake of sodium 1500mg , cholesterol 200mg , calories 30% from fat and 7% or less from saturated fats, daily to have 5 or more servings of fruits and vegetables.  Education: All About Nutrition: -Group instruction provided by verbal, written material, interactive activities, discussions, models, and posters to present general guidelines for heart healthy nutrition including fat, fiber, MyPlate, the role of sodium in heart healthy nutrition, utilization of the nutrition label, and utilization of this knowledge for meal planning. Follow up email sent as well. Written material given at graduation. Flowsheet Row Cardiac Rehab from 03/15/2021 in [facility] Cardiac and Pulmonary Rehab  Date 03/08/21  Educator [facility]  Instruction Review Code 1- Verbalizes Understanding      Biometrics:  Pre Biometrics - 02/13/21 1524      Pre Biometrics   Height 5\' 10"  (1.778 m)    Weight 136 lb (61.7 kg)    BMI (Calculated) 19.51    Single Leg Stand 3 seconds            Nutrition Therapy Plan and Nutrition Goals:  Nutrition Therapy & Goals - 02/13/21 1428      Nutrition Therapy   Diet Heart healthy, low Na    Drug/Food Interactions Statins/Certain Fruits    Protein (specify units) 70g    Fiber 30 grams    Whole Grain Foods 3  servings    Saturated Fats 12 max. grams    Fruits and Vegetables 8 servings/day    Sodium 1.5 grams      Personal Nutrition Goals   Nutrition Goal ST: add nuts or peanut butter to breakfast, add in protein snack like boiled egg, try whole wheat bread LT: gain back muscle    Comments His wife is a Biomedical scientist and she cooks low salt, baked, limits fried food. He will have cereal during the week. Wife uses olive oil when cooking. He eats fish 1x/week, cut down on red meat, pork chop baked once in awhile, will eat baked chicken. He likes mashed potatoes, green bean, peas, and fruit. B: cereal (cheerios) - whole milk with some fruit. Green tea (lemon and honey). D: meat, vegetables (starch/non-starchy). He uses white wheat bread, but will try whole wheat bread, he also like whole wheat pasta and brown rice. He reports his weight has gone down ~6lbs since heart event due to low appetite. Discussed heart healthy eating and how to increase calories and protein to gain back muscle.      Intervention Plan   Intervention Prescribe, educate and counsel regarding individualized specific dietary modifications aiming towards targeted core components such as weight, hypertension, lipid management, diabetes, heart failure and other comorbidities.;Nutrition handout(s) given to patient.    Expected Outcomes Short Term Goal: Understand basic principles of dietary content, such as calories, fat, sodium, cholesterol and nutrients.;Short Term Goal: A plan has been developed with personal nutrition goals set during dietitian appointment.;Long Term Goal: Adherence to prescribed nutrition plan.           Nutrition Assessments:  MEDIFICTS Score Key:  ?70 Need to make dietary changes   40-70 Heart Healthy Diet  ? 40 Therapeutic Level Cholesterol Diet  Flowsheet Row Cardiac Rehab from 02/13/2021 in [facility] Cardiac and Pulmonary Rehab  Picture Your Plate Total Score on Admission 60     Picture Your Plate Scores:  <15  Unhealthy dietary pattern with much room for improvement.  41-50 Dietary pattern unlikely to meet recommendations for good health and room for improvement.  51-60 More healthful dietary pattern, with some room for improvement.   >60 Healthy dietary pattern, although there may be some specific behaviors that could be improved.    Nutrition Goals Re-Evaluation:  Nutrition Goals Re-Evaluation    [place] Name 03/08/21 1342             Goals   Nutrition Goal ST: add nuts or peanut butter to breakfast, add in protein snack like boiled egg, try whole wheat bread LT: gain back muscle       Comment Carl Townsend is doing well with his diet.  He has tried to add in more protein and making more grain based cookies.  He has tried some kellog nutrigrain bars as snack options.  He is tring a variety  on new protein sources to see what he likes.       Expected Outcome Short: Continue to try new protein options Long: continue to make heart healthy choices              Nutrition Goals Discharge (Final Nutrition Goals Re-Evaluation):  Nutrition Goals Re-Evaluation - 03/08/21 1342      Goals   Nutrition Goal ST: add nuts or peanut butter to breakfast, add in protein snack like boiled egg, try whole wheat bread LT: gain back muscle    Comment Carl Townsend is doing well with his diet.  He has tried to add in more protein and making more grain based cookies.  He has tried some kellog nutrigrain bars as snack options.  He is tring a variety on new protein sources to see what he likes.    Expected Outcome Short: Continue to try new protein options Long: continue to make heart healthy choices           Psychosocial: Target Goals: Acknowledge presence or absence of significant depression and/or stress, maximize coping skills, provide positive support system. Participant is able to verbalize types and ability to use techniques and skills needed for reducing stress and depression.   Education: Stress, Anxiety, and  Depression - Group verbal and visual presentation to define topics covered.  Reviews how body is impacted by stress, anxiety, and depression.  Also discusses healthy ways to reduce stress and to treat/manage anxiety and depression.  Written material given at graduation.   Education: Sleep Hygiene -Provides group verbal and written instruction about how sleep can affect your health.  Define sleep hygiene, discuss sleep cycles and impact of sleep habits. Review good sleep hygiene tips.    Initial Review & Psychosocial Screening:  Initial Psych Review & Screening - 02/10/21 1308      Initial Review   Current issues with Current Stress Concerns    Source of Stress Concerns Unable to participate in former interests or hobbies;Unable to perform yard/household activities      [place]? Yes   wife     Barriers   Psychosocial barriers to participate in program There are no identifiable barriers or psychosocial needs.;The patient should benefit from training in stress management and relaxation.      Screening Interventions   Interventions Encouraged to exercise;Provide feedback about the scores to participant;To provide support and resources with identified psychosocial needs    Expected Outcomes Short Term goal: Utilizing psychosocial counselor, staff and physician to assist with identification of specific Stressors or current issues interfering with healing process. Setting desired goal for each stressor or current issue identified.;Long Term Goal: Stressors or current issues are controlled or eliminated.;Short Term goal: Identification and review with participant of any Quality of Life or Depression concerns found by scoring the questionnaire.;Long Term goal: The participant improves quality of Life and PHQ9 Scores as seen by post scores and/or verbalization of changes           Quality of Life Scores:   Quality of Life - 02/13/21 1529      Quality of Life    Select Quality of Life      Quality of Life Scores   Health/Function Pre 20.93 %    Socioeconomic Pre 23.5 %    Psych/Spiritual Pre 29.29 %    Family Pre 20.5 %    GLOBAL Pre 23.19 %          Scores of 19 and  below usually indicate a poorer quality of life in these areas.  A difference of  2-3 points is a clinically meaningful difference.  A difference of 2-3 points in the total score of the Quality of Life Index has been associated with significant improvement in overall quality of life, self-image, physical symptoms, and general health in studies assessing change in quality of life.  PHQ-9: Recent Review Flowsheet Data    Depression screen [facility] 2/9 02/13/2021   Decreased Interest 0   Down, Depressed, Hopeless 0   PHQ - 2 Score 0   Altered sleeping 0   Tired, decreased energy 1   Change in appetite 1   Feeling bad or failure about yourself  0   Trouble concentrating 0   Moving slowly or fidgety/restless 0   Suicidal thoughts 0   PHQ-9 Score 2   Difficult doing work/chores Not difficult at all     Interpretation of Total Score  Total Score Depression Severity:  1-4 = Minimal depression, 5-9 = Mild depression, 10-14 = Moderate depression, 15-19 = Moderately severe depression, 20-27 = Severe depression   Psychosocial Evaluation and Intervention:  Psychosocial Evaluation - 02/10/21 1312      Psychosocial Evaluation & Interventions   Interventions Encouraged to exercise with the program and follow exercise prescription    Comments 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 He had some chest discomfort soon after discharge that resulted in him taking nitro and led to low blood pressure. Thankfully he hasn't had any more since then and has been feeling better after some medication adjustment. His biggest stressor right now is building back up his stamina so he is excited to start cardiac rehab. His wife is his main support system and feels like they are doing well working  together after his MI. He is retired but keeps to the same sleep schedule of going to bed late and waking up later.    Expected Outcomes Short: attend cardiac rehab for education and exercise. Long: develop and maintain positive self care habits.    Continue Psychosocial Services  Follow up required by staff           Psychosocial Re-Evaluation:  Psychosocial Re-Evaluation    [place] Name 03/08/21 1339             Psychosocial Re-Evaluation   Current issues with Current Stress Concerns       Comments Carl Townsend is doing well in rehab.  He is getting better and more able to help with dishes and activities around the house.  He is hoping to be able to get back to work done outside when the time comes.  He is sleeping pretty good, other than trips to the bathroom. He usually sleeps around 6-8 hrs each night. He denies any currently major stressors, just the daily life stresses.       Expected Outcomes Short: Continue to exericse to build stamina to do what he wants  Long: Continue to stay positive.              Psychosocial Discharge (Final Psychosocial Re-Evaluation):  Psychosocial Re-Evaluation - 03/08/21 1339      Psychosocial Re-Evaluation   Current issues with Current Stress Concerns    Comments Carl Townsend is doing well in rehab.  He is getting better and more able to help with dishes and activities around the house.  He is hoping to be able to get back to work done outside when the time comes.  He is sleeping  pretty good, other than trips to the bathroom. He usually sleeps around 6-8 hrs each night. He denies any currently major stressors, just the daily life stresses.    Expected Outcomes Short: Continue to exericse to build stamina to do what he wants  Long: Continue to stay positive.           Vocational Rehabilitation: Provide vocational rehab assistance to qualifying candidates.   Vocational Rehab Evaluation & Intervention:  Vocational Rehab - 02/10/21 1305      Initial  Vocational Rehab Evaluation & Intervention   Assessment shows need for Vocational Rehabilitation No           Education: Education Goals: Education classes will be provided on a variety of topics geared toward better understanding of heart health and risk factor modification. Participant will state understanding/return demonstration of topics presented as noted by education test scores.  Learning Barriers/Preferences:  Learning Barriers/Preferences - 02/10/21 1305      Learning Barriers/Preferences   Learning Barriers None    Learning Preferences None           General Cardiac Education Topics:  AED/CPR: - Group verbal and written instruction with the use of models to demonstrate the basic use of the AED with the basic ABC's of resuscitation.   Anatomy and Cardiac Procedures: - Group verbal and visual presentation and models provide information about basic cardiac anatomy and function. Reviews the testing methods done to diagnose heart disease and the outcomes of the test results. Describes the treatment choices: Medical Management, Angioplasty, or Coronary Bypass Surgery for treating various heart conditions including Myocardial Infarction, Angina, Valve Disease, and Cardiac Arrhythmias.  Written material given at graduation. Flowsheet Row Cardiac Rehab from 03/15/2021 in [facility] Cardiac and Pulmonary Rehab  Date 02/22/21  Educator [facility]  Instruction Review Code 1- Verbalizes Understanding      Medication Safety: - Group verbal and visual instruction to review commonly prescribed medications for heart and lung disease. Reviews the medication, class of the drug, and side effects. Includes the steps to properly store meds and maintain the prescription regimen.  Written material given at graduation. Flowsheet Row Cardiac Rehab from 03/15/2021 in [facility] Cardiac and Pulmonary Rehab  Date 03/15/21  Educator [facility]  Instruction Review Code 1- Verbalizes Understanding      Intimacy: - Group  verbal instruction through game format to discuss how heart and lung disease can affect sexual intimacy. Written material given at graduation..   Know Your Numbers and Heart Failure: - Group verbal and visual instruction to discuss disease risk factors for cardiac and pulmonary disease and treatment options.  Reviews associated critical values for Overweight/Obesity, Hypertension, Cholesterol, and Diabetes.  Discusses basics of heart failure: signs/symptoms and treatments.  Introduces Heart Failure Zone chart for action plan for heart failure.  Written material given at graduation.   Infection Prevention: - Provides verbal and written material to individual with discussion of infection control including proper hand washing and proper equipment cleaning during exercise session. Flowsheet Row Cardiac Rehab from 03/15/2021 in [facility] Cardiac and Pulmonary Rehab  Date 02/13/21  Educator AS  Instruction Review Code 1- Verbalizes Understanding      Falls Prevention: - Provides verbal and written material to individual with discussion of falls prevention and safety. Flowsheet Row Cardiac Rehab from 03/15/2021 in [facility] Cardiac and Pulmonary Rehab  Date 02/13/21  Educator AS  Instruction Review Code 1- Verbalizes Understanding      Other: -Provides group and verbal instruction on various topics (see comments)  Knowledge Questionnaire Score:  Knowledge Questionnaire Score - 02/13/21 1527      Knowledge Questionnaire Score   Pre Score 21/26 heart disease /exercise           Core Components/Risk Factors/Patient Goals at Admission:  Personal Goals and Risk Factors at Admission - 02/13/21 1525      Core Components/Risk Factors/Patient Goals on Admission    Weight Management Yes;Weight Gain    Admit Weight 136 lb (61.7 kg)    Goal Weight: Short Term 140 lb (63.5 kg)    Goal Weight: Long Term 145 lb (65.8 kg)    Expected Outcomes Short Term: Continue to assess and modify interventions until  short term weight is achieved;Long Term: Adherence to nutrition and physical activity/exercise program aimed toward attainment of established weight goal;Weight Gain: Understanding of general recommendations for a high calorie, high protein meal plan that promotes weight gain by distributing calorie intake throughout the day with the consumption for 4-5 meals, snacks, and/or supplements    Hypertension Yes    Intervention Provide education on lifestyle modifcations including regular physical activity/exercise, weight management, moderate sodium restriction and increased consumption of fresh fruit, vegetables, and low fat dairy, alcohol moderation, and smoking cessation.;Monitor prescription use compliance.    Expected Outcomes Short Term: Continued assessment and intervention until BP is < 140/78mm HG in hypertensive participants. < 130/70mm HG in hypertensive participants with diabetes, heart failure or chronic kidney disease.;Long Term: Maintenance of blood pressure at goal levels.    Lipids Yes    Intervention Provide education and support for participant on nutrition & aerobic/resistive exercise along with prescribed medications to achieve LDL 70mg , HDL >40mg .    Expected Outcomes Short Term: Participant states understanding of desired cholesterol values and is compliant with medications prescribed. Participant is following exercise prescription and nutrition guidelines.;Long Term: Cholesterol controlled with medications as prescribed, with individualized exercise RX and with personalized nutrition plan. Value goals: LDL < 70mg , HDL > 40 mg.           Education:Diabetes - Individual verbal and written instruction to review signs/symptoms of diabetes, desired ranges of glucose level fasting, after meals and with exercise. Acknowledge that pre and post exercise glucose checks will be done for 3 sessions at entry of program.   Core Components/Risk Factors/Patient Goals Review:   Goals and Risk  Factor Review    Row Name 03/08/21 1344             Core Components/Risk Factors/Patient Goals Review   Personal Goals Review Weight Management/Obesity;Hypertension;Lipids       Review Carl Townsend had lost 10 lb initially with his event.  But not  he is around 146 lb and holding steady.  He is trying to eat more protein to help regain muscle.  His pressures are doing well and he is checking them at home about 1x day.  Overall he is doing well.  He had a follow up with Dr. Clayborn Bigness this week.  He had asked some questions about his NTG use with him after he eats. We talked about different ways to help differentiate between chest pain and indejestion.  We will continue to keep a close eye on him.       Expected Outcomes Short: Continue to monitor chest pain and  work weight gain Long: Continue to monitor risk factors.              Core Components/Risk Factors/Patient Goals at Discharge (Final Review):   Goals and Risk Factor Review -  03/08/21 1344      Core Components/Risk Factors/Patient Goals Review   Personal Goals Review Weight Management/Obesity;Hypertension;Lipids    Review Carl Townsend had lost 10 lb initially with his event.  But not  he is around 146 lb and holding steady.  He is trying to eat more protein to help regain muscle.  His pressures are doing well and he is checking them at home about 1x day.  Overall he is doing well.  He had a follow up with Dr. Clayborn Bigness this week.  He had asked some questions about his NTG use with him after he eats. We talked about different ways to help differentiate between chest pain and indejestion.  We will continue to keep a close eye on him.    Expected Outcomes Short: Continue to monitor chest pain and  work weight gain Long: Continue to monitor risk factors.           ITP Comments:  ITP Comments    Row Name 02/10/21 1315 02/13/21 1538 02/15/21 1328 02/22/21 0718 02/27/21 0933   ITP Comments Initial telephone orientation completed. Diagnosis can be  found in [facility] 1/13. EP orientation scheduled for Monday 2/14 at 1pm. Completed 6MWT and gym orientation. Initial ITP created and sent for review to Dr. Emily Filbert, Medical Director. First full day of exercise!  Patient was oriented to gym and equipment including functions, settings, policies, and procedures.  Patient's individual exercise prescription and treatment plan were reviewed.  All starting workloads were established based on the results of the 6 minute walk test done at initial orientation visit.  The plan for exercise progression was also introduced and progression will be customized based on patient's performance and goals. 30 Day review completed. Medical Director ITP review done, changes made as directed, and signed approval by Medical Director.  New to program Carl Townsend stated he had to take a ntg last night. Resolved his symptoms. He when asked stated that this is a regular occurance since he left the hospital. Reviewed the sign and symptoms of MI veresus angina. Steps he needs to follow if he uses NTG and it does not resolve the symptoms. Call 911!  He verbalized understanding. He does have an appt with his cardiologist next week and was advised to call his doctor before appoinment if symptoms persist.          Comments:

## 2021-03-22 NOTE — Progress Notes (Signed)
Daily Session Note  Patient Details  Name: Carl Townsend MRN: [id number] Date of Birth: Jan 05, 1946 Referring Provider:   Flowsheet Row Cardiac Rehab from 02/13/2021 in [facility] Cardiac and Pulmonary Rehab  Referring Provider [facility]      Encounter Date: 03/22/2021  Check In:  Session Check In - 03/22/21 1333      Check-In   Supervising physician immediately available to respond to emergencies See telemetry face sheet for immediately available ER MD    Location [facility]-Cardiac & Pulmonary Rehab    Staff Present Birdie Sons, MPA, RN;Joseph Lou Miner, [place] Exercise Physiologist    Virtual Visit No    Medication changes reported     No    Fall or balance concerns reported    No    Warm-up and Cool-down Performed on first and last piece of equipment    Resistance Training Performed Yes    VAD Patient? No    PAD/SET Patient? No      Pain Assessment   Currently in Pain? No/denies              Social History   Tobacco Use  Smoking Status Former Smoker  . Quit date: 2000  . Years since quitting: 22.2  Smokeless Tobacco Never Used    Goals Met:  Independence with exercise equipment Exercise tolerated well No report of cardiac concerns or symptoms Strength training completed today  Goals Unmet:  Not Applicable  Comments: Pt able to follow exercise prescription today without complaint.  Will continue to monitor for progression.    Dr. Emily Filbert is Medical Director for [place] and [facility].

## 2021-03-23 ENCOUNTER — Other Ambulatory Visit: Payer: Self-pay

## 2021-03-23 ENCOUNTER — Encounter: Payer: Medicare Other | Admitting: *Deleted

## 2021-03-23 DIAGNOSIS — Z955 Presence of coronary angioplasty implant and graft: Secondary | ICD-10-CM

## 2021-03-23 DIAGNOSIS — I214 Non-ST elevation (NSTEMI) myocardial infarction: Secondary | ICD-10-CM

## 2021-03-23 NOTE — Progress Notes (Signed)
Daily Session Note  Patient Details  Name: Carl Townsend MRN: [id number] Date of Birth: [birth year] Referring Provider:   Flowsheet Row Cardiac Rehab from 02/13/2021 in [facility] Cardiac and Pulmonary Rehab  Referring Provider [facility]      Encounter Date: 03/23/2021  Check In:  Session Check In - 03/23/21 1339      Check-In   Supervising physician immediately available to respond to emergencies See telemetry face sheet for immediately available ER MD    Location [facility]-Cardiac & Pulmonary Rehab    Staff Present Renita Papa, RN BSN;Joseph [address] [place], [place], [place], CCRP, CCET    Virtual Visit No    Medication changes reported     No    Fall or balance concerns reported    No    Warm-up and Cool-down Performed on first and last piece of equipment    Resistance Training Performed Yes    VAD Patient? No    PAD/SET Patient? No      Pain Assessment   Currently in Pain? No/denies              Social History   Tobacco Use  Smoking Status Former Smoker  . Quit date: 2000  . Years since quitting: 22.2  Smokeless Tobacco Never Used    Goals Met:  Independence with exercise equipment Exercise tolerated well No report of cardiac concerns or symptoms Strength training completed today  Goals Unmet:  Not Applicable  Comments: Pt able to follow exercise prescription today without complaint.  Will continue to monitor for progression.    Dr. Emily Filbert is Medical Director for [place] and [facility].

## 2021-03-27 ENCOUNTER — Other Ambulatory Visit: Payer: Self-pay

## 2021-03-27 DIAGNOSIS — Z955 Presence of coronary angioplasty implant and graft: Secondary | ICD-10-CM

## 2021-03-27 DIAGNOSIS — I214 Non-ST elevation (NSTEMI) myocardial infarction: Secondary | ICD-10-CM

## 2021-03-27 NOTE — Progress Notes (Signed)
Daily Session Note  Patient Details  Name: ANES RIGEL MRN: [id number] Date of Birth: Jan 03, 1946 Referring Provider:   Flowsheet Row Cardiac Rehab from 02/13/2021 in [facility] Cardiac and Pulmonary Rehab  Referring Provider [facility]      Encounter Date: 03/27/2021  Check In:  Session Check In - 03/27/21 1333      Check-In   Supervising physician immediately available to respond to emergencies See telemetry face sheet for immediately available ER MD    Location [facility]-Cardiac & Pulmonary Rehab    Staff Present Birdie Sons, MPA, Mauricia Area, BS, ACSM CEP, Exercise Physiologist;Meredith Sherryll Burger, RN BSN    Virtual Visit No    Medication changes reported     No    Fall or balance concerns reported    No    Warm-up and Cool-down Performed on first and last piece of equipment    Resistance Training Performed Yes    VAD Patient? No    PAD/SET Patient? No      Pain Assessment   Currently in Pain? No/denies              Social History   Tobacco Use  Smoking Status Former Smoker  . Quit date: 2000  . Years since quitting: 22.2  Smokeless Tobacco Never Used    Goals Met:  Independence with exercise equipment Exercise tolerated well No report of cardiac concerns or symptoms Strength training completed today  Goals Unmet:  Not Applicable  Comments: Pt able to follow exercise prescription today without complaint.  Will continue to monitor for progression.    Dr. Emily Filbert is Medical Director for [place] and [facility].

## 2021-03-29 ENCOUNTER — Other Ambulatory Visit: Payer: Self-pay

## 2021-03-29 DIAGNOSIS — Z955 Presence of coronary angioplasty implant and graft: Secondary | ICD-10-CM

## 2021-03-29 DIAGNOSIS — I214 Non-ST elevation (NSTEMI) myocardial infarction: Secondary | ICD-10-CM

## 2021-03-29 NOTE — Progress Notes (Signed)
Daily Session Note  Patient Details  Name: Carl Townsend MRN: [id number] Date of Birth: [birth year] Referring Provider:   Flowsheet Row Cardiac Rehab from 02/13/2021 in [facility] Cardiac and Pulmonary Rehab  Referring Provider [facility]      Encounter Date: 03/29/2021  Check In:  Session Check In - 03/29/21 1343      Check-In   Supervising physician immediately available to respond to emergencies See telemetry face sheet for immediately available ER MD    Location [facility]-Cardiac & Pulmonary Rehab    Staff Present Birdie Sons, MPA, RN;Melissa Caiola RDN, LDN;Susanne Bice, RN, BSN, CCRP;Jessica [place], MA, RCEP, CCRP, CCET    Virtual Visit No    Medication changes reported     No    Fall or balance concerns reported    No    Warm-up and Cool-down Performed on first and last piece of equipment    Resistance Training Performed Yes    VAD Patient? No    PAD/SET Patient? No      Pain Assessment   Currently in Pain? No/denies              Social History   Tobacco Use  Smoking Status Former Smoker  . Quit date: 2000  . Years since quitting: 22.2  Smokeless Tobacco Never Used    Goals Met:  Independence with exercise equipment Exercise tolerated well No report of cardiac concerns or symptoms Strength training completed today  Goals Unmet:  Not Applicable  Comments: Pt able to follow exercise prescription today without complaint.  Will continue to monitor for progression.    Dr. Emily Filbert is Medical Director for [facility] and [facility].

## 2021-03-30 ENCOUNTER — Encounter: Payer: Medicare Other | Admitting: *Deleted

## 2021-03-30 DIAGNOSIS — Z955 Presence of coronary angioplasty implant and graft: Secondary | ICD-10-CM

## 2021-03-30 DIAGNOSIS — I214 Non-ST elevation (NSTEMI) myocardial infarction: Secondary | ICD-10-CM

## 2021-03-30 NOTE — Progress Notes (Signed)
Daily Session Note  Patient Details  Name: JAIMIE REDDITT MRN: [id number] Date of Birth: 1946/08/07 Referring Provider:   Flowsheet Row Cardiac Rehab from 02/13/2021 in [facility] Cardiac and Pulmonary Rehab  Referring Provider [facility]      Encounter Date: 03/30/2021  Check In:  Session Check In - 03/30/21 1328      Check-In   Supervising physician immediately available to respond to emergencies See telemetry face sheet for immediately available ER MD    Location [facility]-Cardiac & Pulmonary Rehab    Staff Present Renita Papa, RN BSN;Joseph [address] [place], [place], [place], CCRP, CCET    Virtual Visit No    Medication changes reported     No    Fall or balance concerns reported    No    Warm-up and Cool-down Performed on first and last piece of equipment    Resistance Training Performed Yes    VAD Patient? No    PAD/SET Patient? No      Pain Assessment   Currently in Pain? No/denies              Social History   Tobacco Use  Smoking Status Former Smoker  . Quit date: 2000  . Years since quitting: 22.2  Smokeless Tobacco Never Used    Goals Met:  Independence with exercise equipment Exercise tolerated well No report of cardiac concerns or symptoms Strength training completed today  Goals Unmet:  Not Applicable  Comments: Pt able to follow exercise prescription today without complaint.  Will continue to monitor for progression.    Dr. Emily Filbert is Medical Director for [place] and [facility].

## 2021-04-05 ENCOUNTER — Other Ambulatory Visit: Payer: Self-pay

## 2021-04-05 ENCOUNTER — Encounter: Payer: Medicare Other | Attending: Internal Medicine

## 2021-04-05 DIAGNOSIS — I214 Non-ST elevation (NSTEMI) myocardial infarction: Secondary | ICD-10-CM | POA: Insufficient documentation

## 2021-04-05 DIAGNOSIS — Z955 Presence of coronary angioplasty implant and graft: Secondary | ICD-10-CM | POA: Diagnosis present

## 2021-04-05 NOTE — Progress Notes (Signed)
Daily Session Note  Patient Details  Name: Carl Townsend MRN: [id number] Date of Birth: [birth year] Referring Provider:   Flowsheet Row Cardiac Rehab from 02/13/2021 in [facility] Cardiac and Pulmonary Rehab  Referring Provider [facility]      Encounter Date: 04/05/2021  Check In:  Session Check In - 04/05/21 1340      Check-In   Supervising physician immediately available to respond to emergencies See telemetry face sheet for immediately available ER MD    Location [facility]-Cardiac & Pulmonary Rehab    Staff Present Birdie Sons, MPA, RN;Meredith Sherryll Burger, RN Margurite Auerbach, MS Exercise Physiologist    Virtual Visit No    Medication changes reported     No    Fall or balance concerns reported    No    Warm-up and Cool-down Performed on first and last piece of equipment    Resistance Training Performed Yes    VAD Patient? No    PAD/SET Patient? No      Pain Assessment   Currently in Pain? No/denies              Social History   Tobacco Use  Smoking Status Former Smoker  . Quit date: 2000  . Years since quitting: 22.2  Smokeless Tobacco Never Used    Goals Met:  Independence with exercise equipment Exercise tolerated well Personal goals reviewed No report of cardiac concerns or symptoms Strength training completed today  Goals Unmet:  Not Applicable  Comments: Pt able to follow exercise prescription today without complaint.  Will continue to monitor for progression.    Dr. Emily Filbert is Medical Director for [place] and [facility].

## 2021-04-06 ENCOUNTER — Encounter: Payer: Medicare Other | Admitting: *Deleted

## 2021-04-06 DIAGNOSIS — I214 Non-ST elevation (NSTEMI) myocardial infarction: Secondary | ICD-10-CM

## 2021-04-06 DIAGNOSIS — Z955 Presence of coronary angioplasty implant and graft: Secondary | ICD-10-CM

## 2021-04-06 NOTE — Progress Notes (Signed)
Daily Session Note  Patient Details  Name: Carl Townsend MRN: [id number] Date of Birth: 06-Jan-1946 Referring Provider:   Flowsheet Row Cardiac Rehab from 02/13/2021 in [facility]  Referring Provider [facility]      Encounter Date: 04/06/2021  Check In:  Session Check In - 04/06/21 1346      Check-In   Supervising physician immediately available to respond to emergencies See telemetry face sheet for immediately available ER MD    Location [facility]    Staff Present Renita Papa, RN BSN;Joseph [address] [place], [place], [place], CCRP, CCET    Virtual Visit No    Medication changes reported     No    Fall or balance concerns reported    No    Warm-up and Cool-down Performed on first and last piece of equipment    Resistance Training Performed Yes    VAD Patient? No    PAD/SET Patient? No      Pain Assessment   Currently in Pain? No/denies              Social History   Tobacco Use  Smoking Status Former Smoker  . Quit date: 2000  . Years since quitting: 22.2  Smokeless Tobacco Never Used    Goals Met:  Independence with exercise equipment Exercise tolerated well No report of cardiac concerns or symptoms Strength training completed today  Goals Unmet:  Not Applicable  Comments: Pt able to follow exercise prescription today without complaint.  Will continue to monitor for progression.    Dr. Emily Filbert is Medical Director for [place] and [facility].

## 2021-04-10 ENCOUNTER — Other Ambulatory Visit: Payer: Self-pay

## 2021-04-10 DIAGNOSIS — I214 Non-ST elevation (NSTEMI) myocardial infarction: Secondary | ICD-10-CM | POA: Diagnosis not present

## 2021-04-10 DIAGNOSIS — Z955 Presence of coronary angioplasty implant and graft: Secondary | ICD-10-CM

## 2021-04-10 NOTE — Progress Notes (Signed)
Daily Session Note  Patient Details  Name: Carl Townsend MRN: [id number] Date of Birth: 01/23/1946 Referring Provider:   Flowsheet Row Cardiac Rehab from 02/13/2021 in [facility]  Referring Provider [place]      Encounter Date: 04/10/2021  Check In:  Session Check In - 04/10/21 1352      Check-In   Supervising physician immediately available to respond to emergencies See telemetry face sheet for immediately available ER MD    Location [facility]    Staff Present Kelly Bollinger, MPA, RN;Kelly Hayes, BS, ACSM CEP, Exercise Physiologist;Kara Langdon, MS Exercise Physiologist    Virtual Visit No    Medication changes reported     No    Fall or balance concerns reported    No    Warm-up and Cool-down Performed on first and last piece of equipment    Resistance Training Performed Yes    VAD Patient? No    PAD/SET Patient? No      Pain Assessment   Currently in Pain? No/denies              Social History   Tobacco Use  Smoking Status Former Smoker  . Quit date: 2000  . Years since quitting: 22.2  Smokeless Tobacco Never Used    Goals Met:  Independence with exercise equipment Exercise tolerated well No report of cardiac concerns or symptoms Strength training completed today  Goals Unmet:  Not Applicable  Comments: Pt able to follow exercise prescription today without complaint.  Will continue to monitor for progression.    Dr. Mark Miller is Medical Director for [facility] and [facility]. 

## 2021-04-12 ENCOUNTER — Other Ambulatory Visit: Payer: Self-pay

## 2021-04-12 DIAGNOSIS — Z955 Presence of coronary angioplasty implant and graft: Secondary | ICD-10-CM

## 2021-04-12 DIAGNOSIS — I214 Non-ST elevation (NSTEMI) myocardial infarction: Secondary | ICD-10-CM

## 2021-04-12 NOTE — Progress Notes (Signed)
Daily Session Note  Patient Details  Name: LADAMIEN RAMMEL MRN: [id number] Date of Birth: 05-Jul-1946 Referring Provider:   Flowsheet Row Cardiac Rehab from 02/13/2021 in [facility]  Referring Provider [facility]      Encounter Date: 04/12/2021  Check In:  Session Check In - 04/12/21 1338      Check-In   Supervising physician immediately available to respond to emergencies See telemetry face sheet for immediately available ER MD    Location [facility]    Staff Present Birdie Sons, MPA, RN;Joseph Lou [place], [place] Exercise Physiologist    Virtual Visit No    Medication changes reported     No    Fall or balance concerns reported    No    Warm-up and Cool-down Performed on first and last piece of equipment    Resistance Training Performed Yes    VAD Patient? No    PAD/SET Patient? No      Pain Assessment   Currently in Pain? No/denies            6 Minute Walk    Row Name 02/13/21 1505 04/12/21 1512       6 Minute Walk   Phase Initial Discharge    Distance 900 feet 1180 feet    Distance % Change -- 31.1 %    Distance Feet Change -- 280 ft    Walk Time 6 minutes 6 minutes    # of Rest Breaks 0 0    MPH 1.7 2.23    METS 2.82 2.98    RPE 11 11    Perceived Dyspnea  1 0    VO2 Peak 9.9 10.4    Symptoms No No    Resting HR 88 bpm 68 bpm    Resting BP 128/66 120/58    Resting Oxygen Saturation  96 % --    Exercise Oxygen Saturation  during 6 min walk 95 % --    Max Ex. HR 110 bpm 96 bpm    Max Ex. BP 154/74 128/60    2 Minute Post BP 124/64 --            Social History   Tobacco Use  Smoking Status Former Smoker  . Quit date: 2000  . Years since quitting: 22.2  Smokeless Tobacco Never Used    Goals Met:  Independence with exercise equipment Exercise tolerated well No report of cardiac concerns or symptoms Strength training completed today  Goals Unmet:  Not Applicable  Comments: Pt  able to follow exercise prescription today without complaint.  Will continue to monitor for progression.    Dr. Emily Filbert is Medical Director for [place] and [facility].

## 2021-04-13 ENCOUNTER — Other Ambulatory Visit: Payer: Self-pay

## 2021-04-13 ENCOUNTER — Encounter: Payer: Medicare Other | Admitting: *Deleted

## 2021-04-13 DIAGNOSIS — I214 Non-ST elevation (NSTEMI) myocardial infarction: Secondary | ICD-10-CM | POA: Diagnosis not present

## 2021-04-13 DIAGNOSIS — Z955 Presence of coronary angioplasty implant and graft: Secondary | ICD-10-CM

## 2021-04-13 NOTE — Progress Notes (Signed)
Daily Session Note  Patient Details  Name: Carl Townsend MRN: [id number] Date of Birth: March 14, 1946 Referring Provider:   Flowsheet Row Cardiac Rehab from 02/13/2021 in [facility]  Referring Provider [facility]      Encounter Date: 04/13/2021  Check In:  Session Check In - 04/13/21 1338      Check-In   Supervising physician immediately available to respond to emergencies See telemetry face sheet for immediately available ER MD    Location [facility]    Staff Present Renita Papa, RN BSN;Joseph [address] [place], [place], [place], CCRP, CCET    Virtual Visit No    Medication changes reported     No    Fall or balance concerns reported    No    Warm-up and Cool-down Performed on first and last piece of equipment    Resistance Training Performed Yes    VAD Patient? No    PAD/SET Patient? No      Pain Assessment   Currently in Pain? No/denies              Social History   Tobacco Use  Smoking Status Former Smoker  . Quit date: 2000  . Years since quitting: 22.2  Smokeless Tobacco Never Used    Goals Met:  Independence with exercise equipment Exercise tolerated well No report of cardiac concerns or symptoms Strength training completed today  Goals Unmet:  Not Applicable  Comments: Pt able to follow exercise prescription today without complaint.  Will continue to monitor for progression.    Dr. Emily Filbert is Medical Director for [facility] Townsend and [facility].

## 2021-04-17 ENCOUNTER — Other Ambulatory Visit: Payer: Self-pay

## 2021-04-17 ENCOUNTER — Encounter: Payer: Medicare Other | Admitting: *Deleted

## 2021-04-17 DIAGNOSIS — I214 Non-ST elevation (NSTEMI) myocardial infarction: Secondary | ICD-10-CM

## 2021-04-17 DIAGNOSIS — Z955 Presence of coronary angioplasty implant and graft: Secondary | ICD-10-CM

## 2021-04-17 NOTE — Progress Notes (Signed)
Daily Session Note  Patient Details  Name: Carl Townsend MRN: [id number] Date of Birth: September 05, 1946 Referring Provider:   Flowsheet Row Cardiac Rehab from 02/13/2021 in [facility]  Referring Provider [facility]      Encounter Date: 04/17/2021  Check In:  Session Check In - 04/17/21 1357      Check-In   Supervising physician immediately available to respond to emergencies See telemetry face sheet for immediately available ER MD    Location [facility]    Staff Present Heath Lark, RN, BSN, Laveda Norman, BS, ACSM CEP, Exercise Physiologist;Joseph Tessie Fass RCP,RRT,BSRT    Virtual Visit No    Medication changes reported     No    Fall or balance concerns reported    No    Warm-up and Cool-down Performed on first and last piece of equipment    Resistance Training Performed Yes    VAD Patient? No    PAD/SET Patient? No      Pain Assessment   Currently in Pain? No/denies              Social History   Tobacco Use  Smoking Status Former Smoker  . Quit date: 2000  . Years since quitting: 22.3  Smokeless Tobacco Never Used    Goals Met:  Independence with exercise equipment Exercise tolerated well No report of cardiac concerns or symptoms  Goals Unmet:  Not Applicable  Comments: Pt able to follow exercise prescription today without complaint.  Will continue to monitor for progression.    Dr. Emily Filbert is Medical Director for [place] and [facility].

## 2021-04-19 ENCOUNTER — Encounter: Payer: Self-pay | Admitting: *Deleted

## 2021-04-19 ENCOUNTER — Other Ambulatory Visit: Payer: Self-pay

## 2021-04-19 ENCOUNTER — Encounter: Payer: Medicare Other | Admitting: *Deleted

## 2021-04-19 DIAGNOSIS — I214 Non-ST elevation (NSTEMI) myocardial infarction: Secondary | ICD-10-CM

## 2021-04-19 DIAGNOSIS — Z955 Presence of coronary angioplasty implant and graft: Secondary | ICD-10-CM

## 2021-04-19 NOTE — Progress Notes (Signed)
Cardiac Individual Treatment Plan  Patient Details  Name: Carl Townsend MRN: [id number] Date of Birth: [birth year] Referring Provider:   Flowsheet Row Cardiac Rehab from 02/13/2021 in [facility] Cardiac and Pulmonary Rehab  Referring Provider [facility]      Initial Encounter Date:  Flowsheet Row Cardiac Rehab from 02/13/2021 in [facility] Cardiac and Pulmonary Rehab  Date 02/13/21      Visit Diagnosis: NSTEMI (non-ST elevated myocardial infarction) [facility])  Status post coronary artery stent placement  Patient's Home Medications on Admission:  Current Outpatient Medications:  .  albuterol (VENTOLIN HFA) 108 (90 Base) MCG/ACT inhaler, Inhale 2 puffs into the lungs every 4 (four) hours as needed for wheezing or shortness of breath., Disp: 1 each, Rfl: 0 .  ascorbic acid (VITAMIN C) 500 MG tablet, Take 500 mg by mouth daily., Disp: , Rfl:  .  aspirin 81 MG tablet, Take 81 mg by mouth daily., Disp: , Rfl:  .  atenolol (TENORMIN) 50 MG tablet, Take 50 mg by mouth at bedtime., Disp: , Rfl:  .  atorvastatin (LIPITOR) 80 MG tablet, Take 1 tablet (80 mg total) by mouth at bedtime., Disp: 30 tablet, Rfl: 2 .  budesonide (PULMICORT) 1 MG/2ML nebulizer solution, Inhale 2 mLs into the lungs daily., Disp: , Rfl:  .  candesartan (ATACAND) 16 MG tablet, Take 16 mg by mouth daily., Disp: , Rfl:  .  Carboxymethylcellulose Sodium (REFRESH LIQUIGEL) 1 % GEL, Place 1 drop into both eyes at bedtime. , Disp: , Rfl:  .  cyanocobalamin 1000 MCG tablet, Take 1,000 mcg by mouth daily., Disp: , Rfl:  .  ergocalciferol (VITAMIN D2) 1.25 MG (50000 UT) capsule, Take 50,000 Units by mouth once a week. (Patient not taking: Reported on 02/10/2021), Disp: , Rfl:  .  Glycerin-Polysorbate 80 (REFRESH DRY EYE THERAPY OP), Place 1 drop into both eyes daily as needed (dry eye). (Patient not taking: Reported on 02/10/2021), Disp: , Rfl:  .  Multiple Vitamin (MULTIVITAMIN) tablet, Take 1 tablet by mouth daily., Disp: , Rfl:  .   nitroGLYCERIN (NITROSTAT) 0.4 MG SL tablet, Place 1 tablet (0.4 mg total) under the tongue every 5 (five) minutes as needed., Disp: 25 tablet, Rfl: 2 .  sildenafil (VIAGRA) 100 MG tablet, Take 100 mg by mouth daily as needed for erectile dysfunction., Disp: , Rfl:  .  ticagrelor (BRILINTA) 90 MG TABS tablet, Take 1 tablet (90 mg total) by mouth 2 (two) times daily., Disp: 60 tablet, Rfl: 2 .  zinc gluconate 50 MG tablet, Take 50 mg by mouth daily., Disp: , Rfl:   Past Medical History: Past Medical History:  Diagnosis Date  . Arthritis    neck and shoulders  . Chronic kidney disease    chronic kidney disease - stage 3, GFR 30-59 ml/min  . Colon cancer ([place])   . ED (erectile dysfunction)   . History of 2019 novel coronavirus disease (COVID-19) 08/23/2020  . Hyperlipidemia   . Hypertension   . Inguinal hernia   . Pre-diabetes     Tobacco Use: Social History   Tobacco Use  Smoking Status Former Smoker  . Quit date: 2000  . Years since quitting: 22.3  Smokeless Tobacco Never Used    Labs: Recent Chemical engineer    Labs for ITP Cardiac and Pulmonary Rehab Latest Ref Rng & Units 01/11/2021 01/12/2021   Cholestrol 0 - 200 mg/dL - 147   LDLCALC 0 - 99 mg/dL - 87   HDL >40 mg/dL - 45  Trlycerides <150 mg/dL - 77   Hemoglobin A1c 4.8 - 5.6 % 5.2 -       Exercise Target Goals: Exercise Program Goal: Individual exercise prescription set using results from initial 6 min walk test and THRR while considering  patient's activity barriers and safety.   Exercise Prescription Goal: Initial exercise prescription builds to 30-45 minutes a day of aerobic activity, 2-3 days per week.  Home exercise guidelines will be given to patient during program as part of exercise prescription that the participant will acknowledge.   Education: Aerobic Exercise: - Group verbal and visual presentation on the components of exercise prescription. Introduces F.I.T.T principle from ACSM for exercise  prescriptions.  Reviews F.I.T.T. principles of aerobic exercise including progression. Written material given at graduation.   Education: Resistance Exercise: - Group verbal and visual presentation on the components of exercise prescription. Introduces F.I.T.T principle from ACSM for exercise prescriptions  Reviews F.I.T.T. principles of resistance exercise including progression. Written material given at graduation. Flowsheet Row Cardiac Rehab from 04/12/2021 in [facility] Cardiac and Pulmonary Rehab  Date 02/22/21  Educator AS  Instruction Review Code 1- Verbalizes Understanding       Education: Exercise & Equipment Safety: - Individual verbal instruction and demonstration of equipment use and safety with use of the equipment. Flowsheet Row Cardiac Rehab from 04/12/2021 in [facility] Cardiac and Pulmonary Rehab  Date 02/13/21  Educator AS  Instruction Review Code 1- Verbalizes Understanding      Education: Exercise Physiology & General Exercise Guidelines: - Group verbal and written instruction with models to review the exercise physiology of the cardiovascular system and associated critical values. Provides general exercise guidelines with specific guidelines to those with heart or lung disease.  Flowsheet Row Cardiac Rehab from 04/12/2021 in [facility] Cardiac and Pulmonary Rehab  Date 04/12/21  Educator AS  Instruction Review Code 1- Verbalizes Understanding      Education: Flexibility, Balance, Mind/Body Relaxation: - Group verbal and visual presentation with interactive activity on the components of exercise prescription. Introduces F.I.T.T principle from ACSM for exercise prescriptions. Reviews F.I.T.T. principles of flexibility and balance exercise training including progression. Also discusses the mind body connection.  Reviews various relaxation techniques to help reduce and manage stress (i.e. Deep breathing, progressive muscle relaxation, and visualization). Balance handout provided to take  home. Written material given at graduation. Flowsheet Row Cardiac Rehab from 04/12/2021 in [facility] Cardiac and Pulmonary Rehab  Date 03/01/21  Educator AS  Instruction Review Code 1- Verbalizes Understanding      Activity Barriers & Risk Stratification:  Activity Barriers & Cardiac Risk Stratification - 02/10/21 1306      Activity Barriers & Cardiac Risk Stratification   Activity Barriers Back Problems;Arthritis;Neck/Spine Problems;Other (comment)   arthritis in back and neck   Comments numbness in both feet related to arthritis    Cardiac Risk Stratification High           6 Minute Walk:  6 Minute Walk    Row Name 02/13/21 1505 04/12/21 1512       6 Minute Walk   Phase Initial Discharge    Distance 900 feet 1180 feet    Distance % Change -- 31.1 %    Distance Feet Change -- 280 ft    Walk Time 6 minutes 6 minutes    # of Rest Breaks 0 0    MPH 1.7 2.23    METS 2.82 2.98    RPE 11 11    Perceived Dyspnea  1 0  VO2 Peak 9.9 10.4    Symptoms No No    Resting HR 88 bpm 68 bpm    Resting BP 128/66 120/58    Resting Oxygen Saturation  96 % --    Exercise Oxygen Saturation  during 6 min walk 95 % --    Max Ex. HR 110 bpm 96 bpm    Max Ex. BP 154/74 128/60    2 Minute Post BP 124/64 --           Oxygen Initial Assessment:   Oxygen Re-Evaluation:   Oxygen Discharge (Final Oxygen Re-Evaluation):   Initial Exercise Prescription:  Initial Exercise Prescription - 02/13/21 1500      Date of Initial Exercise RX and Referring Provider   Date 02/13/21    Referring Provider [facility]      Treadmill   MPH 1.7    Grade 1.5    Minutes 15    METs 2.5      Recumbant Bike   Level 2    RPM 60    Minutes 15    METs 2.8      NuStep   Level 2    SPM 80    Minutes 15    METs 2.8      T5 Nustep   Level 1    SPM 80    Minutes 15    METs 2.8      Biostep-RELP   Level 2    SPM 50    Minutes 15    METs 2.5      Prescription Details   Frequency (times per  week) 3    Duration Progress to 30 minutes of continuous aerobic without signs/symptoms of physical distress      Intensity   THRR 40-80% of Max Heartrate 111-134    Ratings of Perceived Exertion 11-13    Perceived Dyspnea 0-4      Progression   Progression Continue to progress workloads to maintain intensity without signs/symptoms of physical distress.      Resistance Training   Training Prescription Yes    Weight 3 lb    Reps 10-15           Perform Capillary Blood Glucose checks as needed.  Exercise Prescription Changes:  Exercise Prescription Changes    Row Name 02/13/21 1500 02/27/21 1200 03/02/21 1300 03/15/21 1100 03/28/21 1400     Response to Exercise   Blood Pressure (Admit) 128/66 122/72 -- 120/64 130/62   Blood Pressure (Exercise) 154/74 130/80 -- 134/60 134/68   Blood Pressure (Exit) 124/64 96/52 -- 104/56 120/60   Heart Rate (Admit) 88 bpm 79 bpm -- 88 bpm 52 bpm   Heart Rate (Exercise) 110 bpm 110 bpm -- 114 bpm 116 bpm   Heart Rate (Exit) 94 bpm 89 bpm -- 87 bpm 103 bpm   Oxygen Saturation (Admit) 96 % -- -- -- --   Oxygen Saturation (Exercise) 95 % -- -- -- --   Rating of Perceived Exertion (Exercise) 11 14 -- 12 12   Perceived Dyspnea (Exercise) 1 -- -- -- --   Symptoms none none -- none none   Duration -- Continue with 30 min of aerobic exercise without signs/symptoms of physical distress. -- Continue with 30 min of aerobic exercise without signs/symptoms of physical distress. Continue with 30 min of aerobic exercise without signs/symptoms of physical distress.   Intensity -- THRR unchanged -- THRR unchanged THRR unchanged     Progression   Progression -- Continue to  progress workloads to maintain intensity without signs/symptoms of physical distress. -- Continue to progress workloads to maintain intensity without signs/symptoms of physical distress. Continue to progress workloads to maintain intensity without signs/symptoms of physical distress.   Average  METs -- 2.8 -- 2.27 2.85     Resistance Training   Training Prescription -- Yes -- Yes Yes   Weight -- 3 lb -- 3 lb 4 lb   Reps -- 10-15 -- 10-15 10-15     Interval Training   Interval Training -- -- -- No No     Treadmill   MPH -- -- -- 1.6 --   Grade -- -- -- 0.5 --   Minutes -- -- -- 15 --   METs -- -- -- 2.32 --     Recumbant Bike   Level -- -- -- -- 1   Minutes -- -- -- -- 15   METs -- -- -- -- 3.3     NuStep   Level -- 2 -- 2 2   SPM -- 80 -- -- --   Minutes -- 15 -- 15 15   METs -- 2.6 -- 2.5 2.4     Biostep-RELP   Level -- 1 -- 2 --   SPM -- 50 -- -- --   Minutes -- 15 -- 15 --   METs -- 3 -- 2 --     Home Exercise Plan   Plans to continue exercise at -- -- Home (comment)  treadmill, bike, youtube videos Home (comment)  treadmill, bike, [employer] Home (comment)  treadmill, bike, youtube videos   Frequency -- -- Add 2 additional days to program exercise sessions. Add 2 additional days to program exercise sessions. Add 2 additional days to program exercise sessions.   Initial Home Exercises Provided -- -- 03/02/21 03/02/21 03/02/21   Row Name 04/10/21 1500             Response to Exercise   Blood Pressure (Admit) 132/70       Blood Pressure (Exercise) 132/70       Blood Pressure (Exit) 114/60       Heart Rate (Admit) 90 bpm       Heart Rate (Exercise) 104 bpm       Heart Rate (Exit) 94 bpm       Rating of Perceived Exertion (Exercise) 14       Symptoms none       Duration Continue with 30 min of aerobic exercise without signs/symptoms of physical distress.       Intensity THRR unchanged               Progression   Progression Continue to progress workloads to maintain intensity without signs/symptoms of physical distress.       Average METs 2.53               Resistance Training   Training Prescription Yes       Weight 4 lb       Reps 10-15               Interval Training   Interval Training No               Treadmill   MPH 2        Grade 2       Minutes 15       METs 3.08               NuStep   Level  1       Minutes 15       METs 2.5               Elliptical   Level 1       Speed 2.5       Minutes 2               T5 Nustep   Level 4       Minutes 15               Biostep-RELP   Level 1       Minutes 15       METs 2               Home Exercise Plan   Plans to continue exercise at Home (comment)  treadmill, bike, youtube videos       Frequency Add 2 additional days to program exercise sessions.       Initial Home Exercises Provided 03/02/21              Exercise Comments:  Exercise Comments    Row Name 02/15/21 1328 02/27/21 0937 02/27/21 1018 03/02/21 1348     Exercise Comments First full day of exercise!  Patient was oriented to gym and equipment including functions, settings, policies, and procedures.  Patient's individual exercise prescription and treatment plan were reviewed.  All starting workloads were established based on the results of the 6 minute walk test done at initial orientation visit.  The plan for exercise progression was also introduced and progression will be customized based on patient's performance and goals. Dacota stated he had to take a ntg last night. Resolved his symptoms. He when asked stated that this is a regular occurance since he left the hospital. Reviewed the sign and symptoms of MI veresus angina. Steps he needs to follow if he uses NTG and it does not resolve the symptoms. Call 911!  He verbalized understanding. He does have an appt with his cardiologist next week and was advised to call his doctor before appoinment if symptoms persist. No concerns today with angina symptoms Reviewed home exercise with pt today.  Pt plans to use his treadmill, bicycle, and YouTube videos for exercise.  Reviewed THR, pulse, RPE, sign and symptoms, pulse oximetery and when to call 911 or MD.  Also discussed weather considerations and indoor options.  Pt voiced understanding.            Exercise Goals and Review:  Exercise Goals    Row Name 02/13/21 1523             Exercise Goals   Increase Physical Activity Yes       Intervention Provide advice, education, support and counseling about physical activity/exercise needs.;Develop an individualized exercise prescription for aerobic and resistive training based on initial evaluation findings, risk stratification, comorbidities and participant's personal goals.       Expected Outcomes Short Term: Attend rehab on a regular basis to increase amount of physical activity.;Long Term: Add in home exercise to make exercise part of routine and to increase amount of physical activity.;Long Term: Exercising regularly at least 3-5 days a week.       Increase Strength and Stamina Yes       Intervention Provide advice, education, support and counseling about physical activity/exercise needs.;Develop an individualized exercise prescription for aerobic and resistive training based on initial evaluation findings, risk stratification, comorbidities and participant's personal goals.  Expected Outcomes Short Term: Increase workloads from initial exercise prescription for resistance, speed, and METs.;Short Term: Perform resistance training exercises routinely during rehab and add in resistance training at home;Long Term: Improve cardiorespiratory fitness, muscular endurance and strength as measured by increased METs and functional capacity (6MWT)       Able to understand and use rate of perceived exertion (RPE) scale Yes       Intervention Provide education and explanation on how to use RPE scale       Expected Outcomes Short Term: Able to use RPE daily in rehab to express subjective intensity level;Long Term:  Able to use RPE to guide intensity level when exercising independently       Able to understand and use Dyspnea scale Yes       Intervention Provide education and explanation on how to use Dyspnea scale       Expected Outcomes Short  Term: Able to use Dyspnea scale daily in rehab to express subjective sense of shortness of breath during exertion;Long Term: Able to use Dyspnea scale to guide intensity level when exercising independently       Knowledge and understanding of Target Heart Rate Range (THRR) Yes       Intervention Provide education and explanation of THRR including how the numbers were predicted and where they are located for reference       Expected Outcomes Short Term: Able to state/look up THRR;Short Term: Able to use daily as guideline for intensity in rehab;Long Term: Able to use THRR to govern intensity when exercising independently       Able to check pulse independently Yes       Intervention Provide education and demonstration on how to check pulse in carotid and radial arteries.;Review the importance of being able to check your own pulse for safety during independent exercise       Expected Outcomes Short Term: Able to explain why pulse checking is important during independent exercise;Long Term: Able to check pulse independently and accurately       Understanding of Exercise Prescription Yes       Intervention Provide education, explanation, and written materials on patient's individual exercise prescription       Expected Outcomes Short Term: Able to explain program exercise prescription;Long Term: Able to explain home exercise prescription to exercise independently              Exercise Goals Re-Evaluation :  Exercise Goals Re-Evaluation    [place] Name 02/15/21 1328 02/27/21 1244 03/02/21 1348 03/08/21 1337 03/15/21 1116     Exercise Goal Re-Evaluation   Exercise Goals Review Increase Physical Activity;Able to understand and use rate of perceived exertion (RPE) scale;Knowledge and understanding of Target Heart Rate Range (THRR);Understanding of Exercise Prescription;Increase Strength and Stamina;Able to understand and use Dyspnea scale;Able to check pulse independently Increase Physical Activity;Increase  Strength and Stamina Increase Physical Activity;Increase Strength and Stamina;Able to understand and use rate of perceived exertion (RPE) scale;Knowledge and understanding of Target Heart Rate Range (THRR) Increase Physical Activity;Increase Strength and Stamina;Understanding of Exercise Prescription Increase Physical Activity;Increase Strength and Stamina;Understanding of Exercise Prescription   Comments Reviewed RPE and dyspnea scales, THR and program prescription with pt today.  Pt voiced understanding and was given a copy of goals to take home. Bari is doing well in rehab.  He works at [employer].  Staff will monitor progress. Home exercise reviewed with patient. He plans to use his treadmill, bike, and YouTube videos to exercise at home.  Patient voiced understanding of THRR and RPE scale Manraj is doing well in rehab.  He is feeling pretty good overall.  He has gotten started with exercise at home and rescued the former clothing racks.  He has not noticed much change in his strength and stamina yet, but can tell that it is going to start getting better. Tadarrius has been doing well in rehab.  He is up to 2.5 METs on the NuStep. He should be ready to start to increase his workloads.  We will continue to monitor his progress.   Expected Outcomes Short: Use RPE daily to regulate intensity. Long: Follow program prescription in THR. Short:  attend consistently Long:  improve overall stamina Short: add 2-3 days of exercise. Long: maintain independence in exercising on own. Short: Continue to add in exercise at home Long: Continue to improve stamina. Short: Increase workloads Long: Continue to improve stamina.   [place] Name 03/28/21 1454 04/05/21 1344 04/10/21 1504         Exercise Goal Re-Evaluation   Exercise Goals Review Increase Physical Activity;Increase Strength and Stamina Increase Physical Activity;Increase Strength and Stamina;Understanding of Exercise Prescription Increase Physical Activity;Increase  Strength and Stamina;Understanding of Exercise Prescription     Comments Shilo has been attending consistently and works in [employer] range.  We will continue to monitor progress. Millan is doing well in rehab.  He is doing well with his exercise. He has noticed that exercise is helping.  He has some equipment at home that he uses and plans to continue to use after graduation.  He is also looking forward to working in the yard again this year.  He went out Saturday and just took his time with it. Latasha is due for his post 6MWT next visit and we expect to see an improvement in his score.  He is planning to use his treadmill and bike at home still after graduation.  He tried the elliptical last week and was able to get 2 min!!  We will continue to monitor his progress.     Expected Outcomes Short:  increase workloads Long: improve overall MET level Short: Improve post 6MWT  Long: Continue to improve stamina. Short: Improve post 6MWT  Long: Continue to improve stamina.            Discharge Exercise Prescription (Final Exercise Prescription Changes):  Exercise Prescription Changes - 04/10/21 1500      Response to Exercise   Blood Pressure (Admit) 132/70    Blood Pressure (Exercise) 132/70    Blood Pressure (Exit) 114/60    Heart Rate (Admit) 90 bpm    Heart Rate (Exercise) 104 bpm    Heart Rate (Exit) 94 bpm    Rating of Perceived Exertion (Exercise) 14    Symptoms none    Duration Continue with 30 min of aerobic exercise without signs/symptoms of physical distress.    Intensity THRR unchanged      Progression   Progression Continue to progress workloads to maintain intensity without signs/symptoms of physical distress.    Average METs 2.53      Resistance Training   Training Prescription Yes    Weight 4 lb    Reps 10-15      Interval Training   Interval Training No      Treadmill   MPH 2    Grade 2    Minutes 15    METs 3.08      NuStep   Level 1    Minutes 15  METs 2.5       Elliptical   Level 1    Speed 2.5    Minutes 2      T5 Nustep   Level 4    Minutes 15      Biostep-RELP   Level 1    Minutes 15    METs 2      Home Exercise Plan   Plans to continue exercise at Home (comment)   treadmill, bike, youtube videos   Frequency Add 2 additional days to program exercise sessions.    Initial Home Exercises Provided 03/02/21           Nutrition:  Target Goals: Understanding of nutrition guidelines, daily intake of sodium <1577m, cholesterol <2053m calories 30% from fat and 7% or less from saturated fats, daily to have 5 or more servings of fruits and vegetables.  Education: All About Nutrition: -Group instruction provided by verbal, written material, interactive activities, discussions, models, and posters to present general guidelines for heart healthy nutrition including fat, fiber, MyPlate, the role of sodium in heart healthy nutrition, utilization of the nutrition label, and utilization of this knowledge for meal planning. Follow up email sent as well. Written material given at graduation. [facility]  Date 03/08/21  Educator [facility] Center[facility]ruction Review Code 1- Verbalizes Understanding      Biometrics:  Pre Biometrics - 02/13/21 1524      Pre Biometrics   Height '5\' 10"'  (1.778 m)    Weight 136 lb (61.7 kg)    BMI (Calculated) 19.51    Single Leg Stand 3 seconds            Nutrition Therapy Plan and Nutrition Goals:  Nutrition Therapy & Goals - 02/13/21 1428      Nutrition Therapy   Diet Heart healthy, low Na    Drug/Food Interactions Statins/Certain Fruits    Protein (specify units) 70g    Fiber 30 grams    Whole Grain Foods 3 servings    Saturated Fats 12 max. grams    Fruits and Vegetables 8 servings/day    Sodium 1.5 grams      Personal Nutrition Goals   Nutrition Goal ST: add nuts or peanut butter to breakfast, add in protein snack like boiled egg, try whole  wheat bread LT: gain back muscle    Comments His wife is a chBiomedical scientistnd she cooks low salt, baked, limits fried food. He will have cereal during the week. Wife uses olive oil when cooking. He eats fish 1x/week, cut down on red meat, pork chop baked once in awhile, will eat baked chicken. He likes mashed potatoes, green bean, peas, and fruit. B: cereal (cheerios) - whole milk with some fruit. Green tea (lemon and honey). D: meat, vegetables (starch/non-starchy). He uses white wheat bread, but will try whole wheat bread, he also like whole wheat pasta and brown rice. He reports his weight has gone down ~6lbs since heart event due to low appetite. Discussed heart healthy eating and how to increase calories and protein to gain back muscle.      Intervention Plan   Intervention Prescribe, educate and counsel regarding individualized specific dietary modifications aiming towards targeted core components such as weight, hypertension, lipid management, diabetes, heart failure and other comorbidities.;Nutrition handout(s) given to patient.    Expected Outcomes Short Term Goal: Understand basic principles of dietary content, such as calories, fat, sodium, cholesterol and nutrients.;Short Term Goal: A  plan has been developed with personal nutrition goals set during dietitian appointment.;Long Term Goal: Adherence to prescribed nutrition plan.           Nutrition Assessments:  MEDIFICTS Score Key:  ?70 Need to make dietary changes   40-70 Heart Healthy Diet  ? 40 Therapeutic Level Cholesterol Diet  Flowsheet Row Cardiac Rehab from 02/13/2021 in [facility] Cardiac and Pulmonary Rehab  Picture Your Plate Total Score on Admission 60     Picture Your Plate Scores:  <52 Unhealthy dietary pattern with much room for improvement.  41-50 Dietary pattern unlikely to meet recommendations for good health and room for improvement.  51-60 More healthful dietary pattern, with some room for improvement.   >60 Healthy  dietary pattern, although there may be some specific behaviors that could be improved.    Nutrition Goals Re-Evaluation:  Nutrition Goals Re-Evaluation    [place] Name 03/08/21 1342 04/05/21 1354           Goals   Nutrition Goal ST: add nuts or peanut butter to breakfast, add in protein snack like boiled egg, try whole wheat bread LT: gain back muscle Short: Continue to try new protein options Long: continue to make heart healthy choices      Comment Taft is doing well with his diet.  He has tried to add in more protein and making more grain based cookies.  He has tried some kellog nutrigrain bars as snack options.  He is tring a variety on new protein sources to see what he likes. Chalres is doing well with his diet.  He is getting in more protein in his diet and trying to eat better overall.  He is dong better with snacking too.      Expected Outcome Short: Continue to try new protein options Long: continue to make heart healthy choices Short: continue to add in protein Long; Continue with healthy diet.             Nutrition Goals Discharge (Final Nutrition Goals Re-Evaluation):  Nutrition Goals Re-Evaluation - 04/05/21 1354      Goals   Nutrition Goal Short: Continue to try new protein options Long: continue to make heart healthy choices    Comment Chalres is doing well with his diet.  He is getting in more protein in his diet and trying to eat better overall.  He is dong better with snacking too.    Expected Outcome Short: continue to add in protein Long; Continue with healthy diet.           Psychosocial: Target Goals: Acknowledge presence or absence of significant depression and/or stress, maximize coping skills, provide positive support system. Participant is able to verbalize types and ability to use techniques and skills needed for reducing stress and depression.   Education: Stress, Anxiety, and Depression - Group verbal and visual presentation to define topics covered.   Reviews how body is impacted by stress, anxiety, and depression.  Also discusses healthy ways to reduce stress and to treat/manage anxiety and depression.  Written material given at graduation.   Education: Sleep Hygiene -Provides group verbal and written instruction about how sleep can affect your health.  Define sleep hygiene, discuss sleep cycles and impact of sleep habits. Review good sleep hygiene tips.    Initial Review & Psychosocial Screening:  Initial Psych Review & Screening - 02/10/21 1308      Initial Review   Current issues with Current Stress Concerns    Source of Stress Concerns Unable  to participate in former interests or hobbies;Unable to perform yard/household activities      [place]? Yes   wife     Barriers   Psychosocial barriers to participate in program There are no identifiable barriers or psychosocial needs.;The patient should benefit from training in stress management and relaxation.      Screening Interventions   Interventions Encouraged to exercise;Provide feedback about the scores to participant;To provide support and resources with identified psychosocial needs    Expected Outcomes Short Term goal: Utilizing psychosocial counselor, staff and physician to assist with identification of specific Stressors or current issues interfering with healing process. Setting desired goal for each stressor or current issue identified.;Long Term Goal: Stressors or current issues are controlled or eliminated.;Short Term goal: Identification and review with participant of any Quality of Life or Depression concerns found by scoring the questionnaire.;Long Term goal: The participant improves quality of Life and PHQ9 Scores as seen by post scores and/or verbalization of changes           Quality of Life Scores:   Quality of Life - 02/13/21 1529      Quality of Life   Select Quality of Life      Quality of Life Scores   Health/Function Pre 20.93  %    Socioeconomic Pre 23.5 %    Psych/Spiritual Pre 29.29 %    Family Pre 20.5 %    GLOBAL Pre 23.19 %          Scores of 19 and below usually indicate a poorer quality of life in these areas.  A difference of  2-3 points is a clinically meaningful difference.  A difference of 2-3 points in the total score of the Quality of Life Index has been associated with significant improvement in overall quality of life, self-image, physical symptoms, and general health in studies assessing change in quality of life.  PHQ-9: Recent Review Flowsheet Data    Depression screen [facility] 2/9 02/13/2021   Decreased Interest 0   Down, Depressed, Hopeless 0   PHQ - 2 Score 0   Altered sleeping 0   Tired, decreased energy 1   Change in appetite 1   Feeling bad or failure about yourself  0   Trouble concentrating 0   Moving slowly or fidgety/restless 0   Suicidal thoughts 0   PHQ-9 Score 2   Difficult doing work/chores Not difficult at all     Interpretation of Total Score  Total Score Depression Severity:  1-4 = Minimal depression, 5-9 = Mild depression, 10-14 = Moderate depression, 15-19 = Moderately severe depression, 20-27 = Severe depression   Psychosocial Evaluation and Intervention:  Psychosocial Evaluation - 02/10/21 1312      Psychosocial Evaluation & Interventions   Interventions Encouraged to exercise with the program and follow exercise prescription    Comments 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 He had some chest discomfort soon after discharge that resulted in him taking nitro and led to low blood pressure. Thankfully he hasn't had any more since then and has been feeling better after some medication adjustment. His biggest stressor right now is building back up his stamina so he is excited to start cardiac rehab. His wife is his main support system and feels like they are doing well working together after his MI. He is retired but keeps to the same sleep schedule of going to  bed late and waking up later.    Expected  Outcomes Short: attend cardiac rehab for education and exercise. Long: develop and maintain positive self care habits.    Continue Psychosocial Services  Follow up required by staff           Psychosocial Re-Evaluation:  Psychosocial Re-Evaluation    [place] Name 03/08/21 1339 04/05/21 1347           Psychosocial Re-Evaluation   Current issues with Current Stress Concerns None Identified      Comments Mohammed is doing well in rehab.  He is getting better and more able to help with dishes and activities around the house.  He is hoping to be able to get back to work done outside when the time comes.  He is sleeping pretty good, other than trips to the bathroom. He usually sleeps around 6-8 hrs each night. He denies any currently major stressors, just the daily life stresses. Marcellino is doing well in rehab still.  He is working towards graduation.  He tries to just breathe his way through stress and shakes it off to move forward.  He is back to working in yard and still sleeping well. Overall, he is positive and has a good outlook.  He has enjoyed rehab and being amongst others going through the same thing he is.  It has also been good to get his stamina back again.      Expected Outcomes Short: Continue to exericse to build stamina to do what he wants  Long: Continue to stay positive. Short: Continue to work towards graduation Long: Continue to stay positive      Interventions -- Encouraged to attend Cardiac Rehabilitation for the exercise      Continue Psychosocial Services  -- Follow up required by staff             Psychosocial Discharge (Final Psychosocial Re-Evaluation):  Psychosocial Re-Evaluation - 04/05/21 1347      Psychosocial Re-Evaluation   Current issues with None Identified    Comments Arturo is doing well in rehab still.  He is working towards graduation.  He tries to just breathe his way through stress and shakes it off to move  forward.  He is back to working in yard and still sleeping well. Overall, he is positive and has a good outlook.  He has enjoyed rehab and being amongst others going through the same thing he is.  It has also been good to get his stamina back again.    Expected Outcomes Short: Continue to work towards graduation Long: Continue to stay positive    Interventions Encouraged to attend Cardiac Rehabilitation for the exercise    Continue Psychosocial Services  Follow up required by staff           Vocational Rehabilitation: Provide vocational rehab assistance to qualifying candidates.   Vocational Rehab Evaluation & Intervention:  Vocational Rehab - 02/10/21 1305      Initial Vocational Rehab Evaluation & Intervention   Assessment shows need for Vocational Rehabilitation No           Education: Education Goals: Education classes will be provided on a variety of topics geared toward better understanding of heart health and risk factor modification. Participant will state understanding/return demonstration of topics presented as noted by education test scores.  Learning Barriers/Preferences:  Learning Barriers/Preferences - 02/10/21 1305      Learning Barriers/Preferences   Learning Barriers None    Learning Preferences None           General Cardiac Education Topics:  AED/CPR: - Group verbal and written instruction with the use of models to demonstrate the basic use of the AED with the basic ABC's of resuscitation.   Anatomy and Cardiac Procedures: - Group verbal and visual presentation and models provide information about basic cardiac anatomy and function. Reviews the testing methods done to diagnose heart disease and the outcomes of the test results. Describes the treatment choices: Medical Management, Angioplasty, or Coronary Bypass Surgery for treating various heart conditions including Myocardial Infarction, Angina, Valve Disease, and Cardiac Arrhythmias.  Written material  given at graduation. Flowsheet Row Cardiac Rehab from 04/12/2021 in [facility] Cardiac and Pulmonary Rehab  Date 02/22/21  Educator [facility]  Instruction Review Code 1- Verbalizes Understanding      Medication Safety: - Group verbal and visual instruction to review commonly prescribed medications for heart and lung disease. Reviews the medication, class of the drug, and side effects. Includes the steps to properly store meds and maintain the prescription regimen.  Written material given at graduation. Flowsheet Row Cardiac Rehab from 04/12/2021 in [facility] Cardiac and Pulmonary Rehab  Date 03/15/21  Educator [facility]  Instruction Review Code 1- Verbalizes Understanding      Intimacy: - Group verbal instruction through game format to discuss how heart and lung disease can affect sexual intimacy. Written material given at graduation..   Know Your Numbers and Heart Failure: - Group verbal and visual instruction to discuss disease risk factors for cardiac and pulmonary disease and treatment options.  Reviews associated critical values for Overweight/Obesity, Hypertension, Cholesterol, and Diabetes.  Discusses basics of heart failure: signs/symptoms and treatments.  Introduces Heart Failure Zone chart for action plan for heart failure.  Written material given at graduation. Flowsheet Row Cardiac Rehab from 04/12/2021 in [facility] Cardiac and Pulmonary Rehab  Date 03/22/21  Educator SB  Instruction Review Code 1- Verbalizes Understanding      Infection Prevention: - Provides verbal and written material to individual with discussion of infection control including proper hand washing and proper equipment cleaning during exercise session. Flowsheet Row Cardiac Rehab from 04/12/2021 in [facility] Cardiac and Pulmonary Rehab  Date 02/13/21  Educator AS  Instruction Review Code 1- Verbalizes Understanding      Falls Prevention: - Provides verbal and written material to individual with discussion of falls prevention and  safety. Flowsheet Row Cardiac Rehab from 04/12/2021 in [facility] Cardiac and Pulmonary Rehab  Date 02/13/21  Educator AS  Instruction Review Code 1- Verbalizes Understanding      Other: -Provides group and verbal instruction on various topics (see comments)   Knowledge Questionnaire Score:  Knowledge Questionnaire Score - 02/13/21 1527      Knowledge Questionnaire Score   Pre Score 21/26 heart disease /exercise           Core Components/Risk Factors/Patient Goals at Admission:  Personal Goals and Risk Factors at Admission - 02/13/21 1525      Core Components/Risk Factors/Patient Goals on Admission    Weight Management Yes;Weight Gain    Admit Weight 136 lb (61.7 kg)    Goal Weight: Short Term 140 lb (63.5 kg)    Goal Weight: Long Term 145 lb (65.8 kg)    Expected Outcomes Short Term: Continue to assess and modify interventions until short term weight is achieved;Long Term: Adherence to nutrition and physical activity/exercise program aimed toward attainment of established weight goal;Weight Gain: Understanding of general recommendations for a high calorie, high protein meal plan that promotes weight gain by distributing calorie intake throughout the day with the  consumption for 4-5 meals, snacks, and/or supplements    Hypertension Yes    Intervention Provide education on lifestyle modifcations including regular physical activity/exercise, weight management, moderate sodium restriction and increased consumption of fresh fruit, vegetables, and low fat dairy, alcohol moderation, and smoking cessation.;Monitor prescription use compliance.    Expected Outcomes Short Term: Continued assessment and intervention until BP is < 140/53m HG in hypertensive participants. < 130/857mHG in hypertensive participants with diabetes, heart failure or chronic kidney disease.;Long Term: Maintenance of blood pressure at goal levels.    Lipids Yes    Intervention Provide education and support for participant  on nutrition & aerobic/resistive exercise along with prescribed medications to achieve LDL <7053mHDL >3m78m  Expected Outcomes Short Term: Participant states understanding of desired cholesterol values and is compliant with medications prescribed. Participant is following exercise prescription and nutrition guidelines.;Long Term: Cholesterol controlled with medications as prescribed, with individualized exercise RX and with personalized nutrition plan. Value goals: LDL < 70mg3mL > 40 mg.           Education:Diabetes - Individual verbal and written instruction to review signs/symptoms of diabetes, desired ranges of glucose level fasting, after meals and with exercise. Acknowledge that pre and post exercise glucose checks will be done for 3 sessions at entry of program.   Core Components/Risk Factors/Patient Goals Review:   Goals and Risk Factor Review    Row Name 03/08/21 1344 04/05/21 1349           Core Components/Risk Factors/Patient Goals Review   Personal Goals Review Weight Management/Obesity;Hypertension;Lipids Weight Management/Obesity;Hypertension;Lipids      Review CharlBrandylost 10 lb initially with his event.  But not  he is around 146 lb and holding steady.  He is trying to eat more protein to help regain muscle.  His pressures are doing well and he is checking them at home about 1x day.  Overall he is doing well.  He had a follow up with Dr. CallwClayborn Bigness week.  He had asked some questions about his NTG use with him after he eats. We talked about different ways to help differentiate between chest pain and indejestion.  We will continue to keep a close eye on him. CharlNavioing well, he is holding his weight steady.  He is trying to eat better and getting lots of protein to help.  He continues to have good pressures.  He sees cardiologist tomorrow. He takes tums to to help, but still having some chest pain with it.      Expected Outcomes Short: Continue to monitor chest  pain and  work weight gain Long: Continue to monitor risk factors. Short: Talk to cardiologist about chest pain  Long: Continue to monitor risk factors.             Core Components/Risk Factors/Patient Goals at Discharge (Final Review):   Goals and Risk Factor Review - 04/05/21 1349      Core Components/Risk Factors/Patient Goals Review   Personal Goals Review Weight Management/Obesity;Hypertension;Lipids    Review CharlKaysinoing well, he is holding his weight steady.  He is trying to eat better and getting lots of protein to help.  He continues to have good pressures.  He sees cardiologist tomorrow. He takes tums to to help, but still having some chest pain with it.    Expected Outcomes Short: Talk to cardiologist about chest pain  Long: Continue to monitor risk factors.  ITP Comments:  ITP Comments    Row Name 02/10/21 1315 02/13/21 1538 02/15/21 1328 02/22/21 0718 02/27/21 0933   ITP Comments Initial telephone orientation completed. Diagnosis can be found in [facility] 1/13. EP orientation scheduled for Monday 2/14 at 1pm. Completed 6MWT and gym orientation. Initial ITP created and sent for review to Dr. Emily Filbert, Medical Director. First full day of exercise!  Patient was oriented to gym and equipment including functions, settings, policies, and procedures.  Patient's individual exercise prescription and treatment plan were reviewed.  All starting workloads were established based on the results of the 6 minute walk test done at initial orientation visit.  The plan for exercise progression was also introduced and progression will be customized based on patient's performance and goals. 30 Day review completed. Medical Director ITP review done, changes made as directed, and signed approval by Medical Director.  New to program [place] stated he had to take a ntg last night. Resolved his symptoms. He when asked stated that this is a regular occurance since he left the hospital. Reviewed the  sign and symptoms of MI veresus angina. Steps he needs to follow if he uses NTG and it does not resolve the symptoms. Call 911!  He verbalized understanding. He does have an appt with his cardiologist next week and was advised to call his doctor before appoinment if symptoms persist.   [place] Name 04/19/21 0747           ITP Comments 30 Day review completed. Medical Director ITP review done, changes made as directed, and signed approval by Medical Director.              Comments:

## 2021-04-19 NOTE — Progress Notes (Signed)
Discharge Progress Report  Patient Details  Name: Carl Townsend MRN: [id number] Date of Birth: August 18, 1946 Referring Provider:   Flowsheet Row Cardiac Rehab from 02/13/2021 in [facility] Cardiac and Pulmonary Rehab  Referring Provider [facility]       Number of Visits: 18  Reason for Discharge:  Patient reached a stable level of exercise. Patient independent in their exercise. Patient has met program and personal goals.  Smoking History:  Social History   Tobacco Use  Smoking Status Former Smoker  . Quit date: 2000  . Years since quitting: 22.3  Smokeless Tobacco Never Used    Diagnosis:  No diagnosis found.  ADL UCSD:   Initial Exercise Prescription:  Initial Exercise Prescription - 02/13/21 1500      Date of Initial Exercise RX and Referring Provider   Date 02/13/21    Referring Provider [facility]      Treadmill   MPH 1.7    Grade 1.5    Minutes 15    METs 2.5      Recumbant Bike   Level 2    RPM 60    Minutes 15    METs 2.8      NuStep   Level 2    SPM 80    Minutes 15    METs 2.8      T5 Nustep   Level 1    SPM 80    Minutes 15    METs 2.8      Biostep-RELP   Level 2    SPM 50    Minutes 15    METs 2.5      Prescription Details   Frequency (times per week) 3    Duration Progress to 30 minutes of continuous aerobic without signs/symptoms of physical distress      Intensity   THRR 40-80% of Max Heartrate 111-134    Ratings of Perceived Exertion 11-13    Perceived Dyspnea 0-4      Progression   Progression Continue to progress workloads to maintain intensity without signs/symptoms of physical distress.      Resistance Training   Training Prescription Yes    Weight 3 lb    Reps 10-15           Discharge Exercise Prescription (Final Exercise Prescription Changes):  Exercise Prescription Changes - 04/10/21 1500      Response to Exercise   Blood Pressure (Admit) 132/70    Blood Pressure (Exercise) 132/70    Blood Pressure  (Exit) 114/60    Heart Rate (Admit) 90 bpm    Heart Rate (Exercise) 104 bpm    Heart Rate (Exit) 94 bpm    Rating of Perceived Exertion (Exercise) 14    Symptoms none    Duration Continue with 30 min of aerobic exercise without signs/symptoms of physical distress.    Intensity THRR unchanged      Progression   Progression Continue to progress workloads to maintain intensity without signs/symptoms of physical distress.    Average METs 2.53      Resistance Training   Training Prescription Yes    Weight 4 lb    Reps 10-15      Interval Training   Interval Training No      Treadmill   MPH 2    Grade 2    Minutes 15    METs 3.08      NuStep   Level 1    Minutes 15    METs 2.5  Elliptical   Level 1    Speed 2.5    Minutes 2      T5 Nustep   Level 4    Minutes 15      Biostep-RELP   Level 1    Minutes 15    METs 2      Home Exercise Plan   Plans to continue exercise at Home (comment)   treadmill, bike, youtube videos   Frequency Add 2 additional days to program exercise sessions.    Initial Home Exercises Provided 03/02/21           Functional Capacity:  6 Minute Walk    Row Name 02/13/21 1505 04/12/21 1512       6 Minute Walk   Phase Initial Discharge    Distance 900 feet 1180 feet    Distance % Change -- 31.1 %    Distance Feet Change -- 280 ft    Walk Time 6 minutes 6 minutes    # of Rest Breaks 0 0    MPH 1.7 2.23    METS 2.82 2.98    RPE 11 11    Perceived Dyspnea  1 0    VO2 Peak 9.9 10.4    Symptoms No No    Resting HR 88 bpm 68 bpm    Resting BP 128/66 120/58    Resting Oxygen Saturation  96 % --    Exercise Oxygen Saturation  during 6 min walk 95 % --    Max Ex. HR 110 bpm 96 bpm    Max Ex. BP 154/74 128/60    2 Minute Post BP 124/64 --           Psychological, QOL, Others - Outcomes: PHQ 2/9: Depression screen [facility] 2/9 04/19/2021 02/13/2021  Decreased Interest 0 0  Down, Depressed, Hopeless 0 0  PHQ - 2 Score 0 0  Altered  sleeping 0 0  Tired, decreased energy 1 1  Change in appetite 0 1  Feeling bad or failure about yourself  0 0  Trouble concentrating 0 0  Moving slowly or fidgety/restless 0 0  Suicidal thoughts 0 0  PHQ-9 Score 1 2  Difficult doing work/chores Not difficult at all Not difficult at all    Quality of Life:  Quality of Life - 04/19/21 1358      Quality of Life   Select Quality of Life      Quality of Life Scores   Health/Function Pre 20.93 %    Health/Function Post 22.97 %    Health/Function % Change 9.75 %    Socioeconomic Pre 23.5 %    Socioeconomic Post 23.29 %    Socioeconomic % Change  -0.89 %    Psych/Spiritual Pre 29.29 %    Psych/Spiritual Post 24.86 %    Psych/Spiritual % Change -15.12 %    Family Pre 20.5 %    Family Post 22.9 %    Family % Change 11.71 %    GLOBAL Pre 23.19 %    GLOBAL Post 23.41 %    GLOBAL % Change 0.95 %          Nutrition & Weight - Outcomes:  Pre Biometrics - 02/13/21 1524      Pre Biometrics   Height _0  (1.778 m)    Weight 136 lb (61.7 kg)    BMI (Calculated) 19.51    Single Leg Stand 3 seconds           Nutrition:  Nutrition  Therapy & Goals - 02/13/21 1428      Nutrition Therapy   Diet Heart healthy, low Na    Drug/Food Interactions Statins/Certain Fruits    Protein (specify units) 70g    Fiber 30 grams    Whole Grain Foods 3 servings    Saturated Fats 12 max. grams    Fruits and Vegetables 8 servings/day    Sodium 1.5 grams      Personal Nutrition Goals   Nutrition Goal ST: add nuts or peanut butter to breakfast, add in protein snack like boiled egg, try whole wheat bread LT: gain back muscle    Comments His wife is a Biomedical scientist and she cooks low salt, baked, limits fried food. He will have cereal during the week. Wife uses olive oil when cooking. He eats fish 1x/week, cut down on red meat, pork chop baked once in awhile, will eat baked chicken. He likes mashed potatoes, green bean, peas, and fruit. B: cereal (cheerios)  - whole milk with some fruit. Green tea (lemon and honey). D: meat, vegetables (starch/non-starchy). He uses white wheat bread, but will try whole wheat bread, he also like whole wheat pasta and brown rice. He reports his weight has gone down ~6lbs since heart event due to low appetite. Discussed heart healthy eating and how to increase calories and protein to gain back muscle.      Intervention Plan   Intervention Prescribe, educate and counsel regarding individualized specific dietary modifications aiming towards targeted core components such as weight, hypertension, lipid management, diabetes, heart failure and other comorbidities.;Nutrition handout(s) given to patient.    Expected Outcomes Short Term Goal: Understand basic principles of dietary content, such as calories, fat, sodium, cholesterol and nutrients.;Short Term Goal: A plan has been developed with personal nutrition goals set during dietitian appointment.;Long Term Goal: Adherence to prescribed nutrition plan.          Education Questionnaire Score:  Knowledge Questionnaire Score - 04/19/21 1359      Knowledge Questionnaire Score   Post Score 25/26           Goals reviewed with patient; copy given to patient.

## 2021-04-19 NOTE — Progress Notes (Signed)
Daily Session Note  Patient Details  Name: Carl Townsend MRN: [id number] Date of Birth: 1946/07/20 Referring Provider:   Flowsheet Row Cardiac Rehab from 02/13/2021 in [facility] Cardiac and Pulmonary Rehab  Referring Provider [facility]      Encounter Date: 04/19/2021  Check In:  Session Check In - 04/19/21 1338      Check-In   Supervising physician immediately available to respond to emergencies See telemetry face sheet for immediately available ER MD    Location [facility]-Cardiac & Pulmonary Rehab    Staff Present Renita Papa, RN BSN;Joseph Lou Miner, [place] Exercise Physiologist    Virtual Visit No    Medication changes reported     No    Fall or balance concerns reported    No    Warm-up and Cool-down Performed on first and last piece of equipment    Resistance Training Performed Yes    VAD Patient? No    PAD/SET Patient? No      Pain Assessment   Currently in Pain? No/denies              Social History   Tobacco Use  Smoking Status Former Smoker  . Quit date: 2000  . Years since quitting: 22.3  Smokeless Tobacco Never Used    Goals Met:  Independence with exercise equipment Exercise tolerated well No report of cardiac concerns or symptoms Strength training completed today  Goals Unmet:  Not Applicable  Comments:  Carl Townsend graduated today from  rehab with 36 sessions completed.  Details of the patient's exercise prescription and what He needs to do in order to continue the prescription and progress were discussed with patient.  Patient was given a copy of prescription and goals.  Patient verbalized understanding.  Carl Townsend plans to continue to exercise by using his treadmill and bike.    Dr. Emily Filbert is Medical Director for [place] and [facility].

## 2021-04-19 NOTE — Patient Instructions (Signed)
Discharge Patient Instructions  Patient Details  Name: Carl Townsend MRN: [id number] Date of Birth: September 08, 1946 Referring Provider:  Yolonda Kida, MD   Number of Visits: 80  Reason for Discharge:  Patient reached a stable level of exercise. Patient independent in their exercise. Patient has met program and personal goals.  Smoking History:  Social History   Tobacco Use  Smoking Status Former Smoker  . Quit date: 2000  . Years since quitting: 22.3  Smokeless Tobacco Never Used    Diagnosis:  No diagnosis found.  Initial Exercise Prescription:  Initial Exercise Prescription - 02/13/21 1500      Date of Initial Exercise RX and Referring Provider   Date 02/13/21    Referring Provider [facility] [address]      Treadmill   MPH 1.7    Grade 1.5    Minutes 15    METs 2.5      Recumbant Bike   Level 2    RPM 60    Minutes 15    METs 2.8      NuStep   Level 2    SPM 80    Minutes 15    METs 2.8      T5 Nustep   Level 1    SPM 80    Minutes 15    METs 2.8      Biostep-RELP   Level 2    SPM 50    Minutes 15    METs 2.5      Prescription Details   Frequency (times per week) 3    Duration Progress to 30 minutes of continuous aerobic without signs/symptoms of physical distress      Intensity   THRR 40-80% of Max Heartrate 111-134    Ratings of Perceived Exertion 11-13    Perceived Dyspnea 0-4      Progression   Progression Continue to progress workloads to maintain intensity without signs/symptoms of physical distress.      Resistance Training   Training Prescription Yes    Weight 3 lb    Reps 10-15           Discharge Exercise Prescription (Final Exercise Prescription Changes):  Exercise Prescription Changes - 04/10/21 1500      Response to Exercise   Blood Pressure (Admit) 132/70    Blood Pressure (Exercise) 132/70    Blood Pressure (Exit) 114/60    Heart Rate (Admit) 90 bpm    Heart Rate (Exercise) 104 bpm    Heart Rate (Exit) 94 bpm     Rating of Perceived Exertion (Exercise) 14    Symptoms none    Duration Continue with 30 min of aerobic exercise without signs/symptoms of physical distress.    Intensity THRR unchanged      Progression   Progression Continue to progress workloads to maintain intensity without signs/symptoms of physical distress.    Average METs 2.53      Resistance Training   Training Prescription Yes    Weight 4 lb    Reps 10-15      Interval Training   Interval Training No      Treadmill   MPH 2    Grade 2    Minutes 15    METs 3.08      NuStep   Level 1    Minutes 15    METs 2.5      Elliptical   Level 1    Speed 2.5    Minutes 2  T5 Nustep   Level 4    Minutes 15      Biostep-RELP   Level 1    Minutes 15    METs 2      Home Exercise Plan   Plans to continue exercise at Home (comment)   treadmill, bike, youtube videos   Frequency Add 2 additional days to program exercise sessions.    Initial Home Exercises Provided 03/02/21           Functional Capacity:  6 Minute Walk    Row Name 02/13/21 1505 04/12/21 1512       6 Minute Walk   Phase Initial Discharge    Distance 900 feet 1180 feet    Distance % Change -- 31.1 %    Distance Feet Change -- 280 ft    Walk Time 6 minutes 6 minutes    # of Rest Breaks 0 0    MPH 1.7 2.23    METS 2.82 2.98    RPE 11 11    Perceived Dyspnea  1 0    VO2 Peak 9.9 10.4    Symptoms No No    Resting HR 88 bpm 68 bpm    Resting BP 128/66 120/58    Resting Oxygen Saturation  96 % --    Exercise Oxygen Saturation  during 6 min walk 95 % --    Max Ex. HR 110 bpm 96 bpm    Max Ex. BP 154/74 128/60    2 Minute Post BP 124/64 --           Quality of Life:  Quality of Life - 02/13/21 1529      Quality of Life   Select Quality of Life      Quality of Life Scores   Health/Function Pre 20.93 %    Socioeconomic Pre 23.5 %    Psych/Spiritual Pre 29.29 %    Family Pre 20.5 %    GLOBAL Pre 23.19 %           Personal  Goals: Goals established at orientation with interventions provided to work toward goal.  Personal Goals and Risk Factors at Admission - 02/13/21 1525      Core Components/Risk Factors/Patient Goals on Admission    Weight Management Yes;Weight Gain    Admit Weight 136 lb (61.7 kg)    Goal Weight: Short Term 140 lb (63.5 kg)    Goal Weight: Long Term 145 lb (65.8 kg)    Expected Outcomes Short Term: Continue to assess and modify interventions until short term weight is achieved;Long Term: Adherence to nutrition and physical activity/exercise program aimed toward attainment of established weight goal;Weight Gain: Understanding of general recommendations for a high calorie, high protein meal plan that promotes weight gain by distributing calorie intake throughout the day with the consumption for 4-5 meals, snacks, and/or supplements    Hypertension Yes    Intervention Provide education on lifestyle modifcations including regular physical activity/exercise, weight management, moderate sodium restriction and increased consumption of fresh fruit, vegetables, and low fat dairy, alcohol moderation, and smoking cessation.;Monitor prescription use compliance.    Expected Outcomes Short Term: Continued assessment and intervention until BP is < 140/56mm HG in hypertensive participants. < 130/59mm HG in hypertensive participants with diabetes, heart failure or chronic kidney disease.;Long Term: Maintenance of blood pressure at goal levels.    Lipids Yes    Intervention Provide education and support for participant on nutrition & aerobic/resistive exercise along with prescribed medications to achieve  LDL '70mg'$ , HDL >$Remo'40mg'LNgnl$ .    Expected Outcomes Short Term: Participant states understanding of desired cholesterol values and is compliant with medications prescribed. Participant is following exercise prescription and nutrition guidelines.;Long Term: Cholesterol controlled with medications as prescribed, with  individualized exercise RX and with personalized nutrition plan. Value goals: LDL < $Rem'70mg'LqUD$ , HDL > 40 mg.            Personal Goals Discharge:  Goals and Risk Factor Review - 04/05/21 1349      Core Components/Risk Factors/Patient Goals Review   Personal Goals Review Weight Management/Obesity;Hypertension;Lipids    Review Lyndon is doing well, he is holding his weight steady.  He is trying to eat better and getting lots of protein to help.  He continues to have good pressures.  He sees cardiologist tomorrow. He takes tums to to help, but still having some chest pain with it.    Expected Outcomes Short: Talk to cardiologist about chest pain  Long: Continue to monitor risk factors.           Exercise Goals and Review:  Exercise Goals    Row Name 02/13/21 1523             Exercise Goals   Increase Physical Activity Yes       Intervention Provide advice, education, support and counseling about physical activity/exercise needs.;Develop an individualized exercise prescription for aerobic and resistive training based on initial evaluation findings, risk stratification, comorbidities and participant's personal goals.       Expected Outcomes Short Term: Attend rehab on a regular basis to increase amount of physical activity.;Long Term: Add in home exercise to make exercise part of routine and to increase amount of physical activity.;Long Term: Exercising regularly at least 3-5 days a week.       Increase Strength and Stamina Yes       Intervention Provide advice, education, support and counseling about physical activity/exercise needs.;Develop an individualized exercise prescription for aerobic and resistive training based on initial evaluation findings, risk stratification, comorbidities and participant's personal goals.       Expected Outcomes Short Term: Increase workloads from initial exercise prescription for resistance, speed, and METs.;Short Term: Perform resistance training exercises  routinely during rehab and add in resistance training at home;Long Term: Improve cardiorespiratory fitness, muscular endurance and strength as measured by increased METs and functional capacity (6MWT)       Able to understand and use rate of perceived exertion (RPE) scale Yes       Intervention Provide education and explanation on how to use RPE scale       Expected Outcomes Short Term: Able to use RPE daily in rehab to express subjective intensity level;Long Term:  Able to use RPE to guide intensity level when exercising independently       Able to understand and use Dyspnea scale Yes       Intervention Provide education and explanation on how to use Dyspnea scale       Expected Outcomes Short Term: Able to use Dyspnea scale daily in rehab to express subjective sense of shortness of breath during exertion;Long Term: Able to use Dyspnea scale to guide intensity level when exercising independently       Knowledge and understanding of Target Heart Rate Range (THRR) Yes       Intervention Provide education and explanation of THRR including how the numbers were predicted and where they are located for reference       Expected Outcomes Short Term: Able  to state/look up THRR;Short Term: Able to use daily as guideline for intensity in rehab;Long Term: Able to use THRR to govern intensity when exercising independently       Able to check pulse independently Yes       Intervention Provide education and demonstration on how to check pulse in carotid and radial arteries.;Review the importance of being able to check your own pulse for safety during independent exercise       Expected Outcomes Short Term: Able to explain why pulse checking is important during independent exercise;Long Term: Able to check pulse independently and accurately       Understanding of Exercise Prescription Yes       Intervention Provide education, explanation, and written materials on patient's individual exercise prescription        Expected Outcomes Short Term: Able to explain program exercise prescription;Long Term: Able to explain home exercise prescription to exercise independently              Exercise Goals Re-Evaluation:  Exercise Goals Re-Evaluation    [place] Name 02/15/21 1328 02/27/21 1244 03/02/21 1348 03/08/21 1337 03/15/21 1116     Exercise Goal Re-Evaluation   Exercise Goals Review Increase Physical Activity;Able to understand and use rate of perceived exertion (RPE) scale;Knowledge and understanding of Target Heart Rate Range (THRR);Understanding of Exercise Prescription;Increase Strength and Stamina;Able to understand and use Dyspnea scale;Able to check pulse independently Increase Physical Activity;Increase Strength and Stamina Increase Physical Activity;Increase Strength and Stamina;Able to understand and use rate of perceived exertion (RPE) scale;Knowledge and understanding of Target Heart Rate Range (THRR) Increase Physical Activity;Increase Strength and Stamina;Understanding of Exercise Prescription Increase Physical Activity;Increase Strength and Stamina;Understanding of Exercise Prescription   Comments Reviewed RPE and dyspnea scales, THR and program prescription with pt today.  Pt voiced understanding and was given a copy of goals to take home. Mahamadou is doing well in rehab.  He works at [employer].  Staff will monitor progress. Home exercise reviewed with patient. He plans to use his treadmill, bike, and YouTube videos to exercise at home. Patient voiced understanding of THRR and RPE scale Ayub is doing well in rehab.  He is feeling pretty good overall.  He has gotten started with exercise at home and rescued the former clothing racks.  He has not noticed much change in his strength and stamina yet, but can tell that it is going to start getting better. Janthony has been doing well in rehab.  He is up to 2.5 METs on the NuStep. He should be ready to start to increase his workloads.  We will continue to  monitor his progress.   Expected Outcomes Short: Use RPE daily to regulate intensity. Long: Follow program prescription in THR. Short:  attend consistently Long:  improve overall stamina Short: add 2-3 days of exercise. Long: maintain independence in exercising on own. Short: Continue to add in exercise at home Long: Continue to improve stamina. Short: Increase workloads Long: Continue to improve stamina.   [place] Name 03/28/21 1454 04/05/21 1344 04/10/21 1504         Exercise Goal Re-Evaluation   Exercise Goals Review Increase Physical Activity;Increase Strength and Stamina Increase Physical Activity;Increase Strength and Stamina;Understanding of Exercise Prescription Increase Physical Activity;Increase Strength and Stamina;Understanding of Exercise Prescription     Comments Noach has been attending consistently and works in [employer] range.  We will continue to monitor progress. Marsden is doing well in rehab.  He is doing well with his exercise. He has  noticed that exercise is helping.  He has some equipment at home that he uses and plans to continue to use after graduation.  He is also looking forward to working in the yard again this year.  He went out Saturday and just took his time with it. Mikyle is due for his post 6MWT next visit and we expect to see an improvement in his score.  He is planning to use his treadmill and bike at home still after graduation.  He tried the elliptical last week and was able to get 2 min!!  We will continue to monitor his progress.     Expected Outcomes Short:  increase workloads Long: improve overall MET level Short: Improve post 6MWT  Long: Continue to improve stamina. Short: Improve post 6MWT  Long: Continue to improve stamina.            Nutrition & Weight - Outcomes:  Pre Biometrics - 02/13/21 1524      Pre Biometrics   Height _0  (1.778 m)    Weight 136 lb (61.7 kg)    BMI (Calculated) 19.51    Single Leg Stand 3 seconds            Nutrition:   Nutrition Therapy & Goals - 02/13/21 1428      Nutrition Therapy   Diet Heart healthy, low Na    Drug/Food Interactions Statins/Certain Fruits    Protein (specify units) 70g    Fiber 30 grams    Whole Grain Foods 3 servings    Saturated Fats 12 max. grams    Fruits and Vegetables 8 servings/day    Sodium 1.5 grams      Personal Nutrition Goals   Nutrition Goal ST: add nuts or peanut butter to breakfast, add in protein snack like boiled egg, try whole wheat bread LT: gain back muscle    Comments His wife is a Biomedical scientist and she cooks low salt, baked, limits fried food. He will have cereal during the week. Wife uses olive oil when cooking. He eats fish 1x/week, cut down on red meat, pork chop baked once in awhile, will eat baked chicken. He likes mashed potatoes, green bean, peas, and fruit. B: cereal (cheerios) - whole milk with some fruit. Green tea (lemon and honey). D: meat, vegetables (starch/non-starchy). He uses white wheat bread, but will try whole wheat bread, he also like whole wheat pasta and brown rice. He reports his weight has gone down ~6lbs since heart event due to low appetite. Discussed heart healthy eating and how to increase calories and protein to gain back muscle.      Intervention Plan   Intervention Prescribe, educate and counsel regarding individualized specific dietary modifications aiming towards targeted core components such as weight, hypertension, lipid management, diabetes, heart failure and other comorbidities.;Nutrition handout(s) given to patient.    Expected Outcomes Short Term Goal: Understand basic principles of dietary content, such as calories, fat, sodium, cholesterol and nutrients.;Short Term Goal: A plan has been developed with personal nutrition goals set during dietitian appointment.;Long Term Goal: Adherence to prescribed nutrition plan.           Nutrition Discharge:   Education Questionnaire Score:  Knowledge Questionnaire Score - 02/13/21 1527       Knowledge Questionnaire Score   Pre Score 21/26 heart disease /exercise           Goals reviewed with patient; copy given to patient.

## 2021-04-19 NOTE — Progress Notes (Signed)
Cardiac Individual Treatment Plan  Patient Details  Name: Carl Townsend MRN: [id number] Date of Birth: Jan 22, 1946 Referring Provider:   Flowsheet Row Cardiac Rehab from 02/13/2021 in [facility] Cardiac and Pulmonary Rehab  Referring Provider [facility]      Initial Encounter Date:  Flowsheet Row Cardiac Rehab from 02/13/2021 in [facility] Cardiac and Pulmonary Rehab  Date 02/13/21      Visit Diagnosis: No diagnosis found.  Patient's Home Medications on Admission:  Current Outpatient Medications:  .  albuterol (VENTOLIN HFA) 108 (90 Base) MCG/ACT inhaler, Inhale 2 puffs into the lungs every 4 (four) hours as needed for wheezing or shortness of breath., Disp: 1 each, Rfl: 0 .  ascorbic acid (VITAMIN C) 500 MG tablet, Take 500 mg by mouth daily., Disp: , Rfl:  .  aspirin 81 MG tablet, Take 81 mg by mouth daily., Disp: , Rfl:  .  atenolol (TENORMIN) 50 MG tablet, Take 50 mg by mouth at bedtime., Disp: , Rfl:  .  atorvastatin (LIPITOR) 80 MG tablet, Take 1 tablet (80 mg total) by mouth at bedtime., Disp: 30 tablet, Rfl: 2 .  budesonide (PULMICORT) 1 MG/2ML nebulizer solution, Inhale 2 mLs into the lungs daily., Disp: , Rfl:  .  candesartan (ATACAND) 16 MG tablet, Take 16 mg by mouth daily., Disp: , Rfl:  .  Carboxymethylcellulose Sodium (REFRESH LIQUIGEL) 1 % GEL, Place 1 drop into both eyes at bedtime. , Disp: , Rfl:  .  cyanocobalamin 1000 MCG tablet, Take 1,000 mcg by mouth daily., Disp: , Rfl:  .  ergocalciferol (VITAMIN D2) 1.25 MG (50000 UT) capsule, Take 50,000 Units by mouth once a week. (Patient not taking: Reported on 02/10/2021), Disp: , Rfl:  .  Glycerin-Polysorbate 80 (REFRESH DRY EYE THERAPY OP), Place 1 drop into both eyes daily as needed (dry eye). (Patient not taking: Reported on 02/10/2021), Disp: , Rfl:  .  Multiple Vitamin (MULTIVITAMIN) tablet, Take 1 tablet by mouth daily., Disp: , Rfl:  .  nitroGLYCERIN (NITROSTAT) 0.4 MG SL tablet, Place 1 tablet (0.4 mg total) under the  tongue every 5 (five) minutes as needed., Disp: 25 tablet, Rfl: 2 .  sildenafil (VIAGRA) 100 MG tablet, Take 100 mg by mouth daily as needed for erectile dysfunction., Disp: , Rfl:  .  ticagrelor (BRILINTA) 90 MG TABS tablet, Take 1 tablet (90 mg total) by mouth 2 (two) times daily., Disp: 60 tablet, Rfl: 2 .  zinc gluconate 50 MG tablet, Take 50 mg by mouth daily., Disp: , Rfl:   Past Medical History: Past Medical History:  Diagnosis Date  . Arthritis    neck and shoulders  . Chronic kidney disease    chronic kidney disease - stage 3, GFR 30-59 ml/min  . Colon cancer ([place])   . ED (erectile dysfunction)   . History of 2019 novel coronavirus disease (COVID-19) 08/23/2020  . Hyperlipidemia   . Hypertension   . Inguinal hernia   . Pre-diabetes     Tobacco Use: Social History   Tobacco Use  Smoking Status Former Smoker  . Quit date: 2000  . Years since quitting: 22.3  Smokeless Tobacco Never Used    Labs: Recent Chemical engineer    Labs for ITP Cardiac and Pulmonary Rehab Latest Ref Rng & Units 01/11/2021 01/12/2021   Cholestrol 0 - 200 mg/dL - 147   LDLCALC 0 - 99 mg/dL - 87   HDL >40 mg/dL - 45   Trlycerides <150 mg/dL - 77   Hemoglobin A1c  4.8 - 5.6 % 5.2 -       Exercise Target Goals: Exercise Program Goal: Individual exercise prescription set using results from initial 6 min walk test and THRR while considering  patient's activity barriers and safety.   Exercise Prescription Goal: Initial exercise prescription builds to 30-45 minutes a day of aerobic activity, 2-3 days per week.  Home exercise guidelines will be given to patient during program as part of exercise prescription that the participant will acknowledge.   Education: Aerobic Exercise: - Group verbal and visual presentation on the components of exercise prescription. Introduces F.I.T.T principle from ACSM for exercise prescriptions.  Reviews F.I.T.T. principles of aerobic exercise including  progression. Written material given at graduation. Flowsheet Row Cardiac Rehab from 04/19/2021 in [facility] Cardiac and Pulmonary Rehab  Date 04/19/21  Educator AS  Instruction Review Code 1- Verbalizes Understanding      Education: Resistance Exercise: - Group verbal and visual presentation on the components of exercise prescription. Introduces F.I.T.T principle from ACSM for exercise prescriptions  Reviews F.I.T.T. principles of resistance exercise including progression. Written material given at graduation. Flowsheet Row Cardiac Rehab from 04/19/2021 in [facility] Cardiac and Pulmonary Rehab  Date 02/22/21  Educator AS  Instruction Review Code 1- Verbalizes Understanding       Education: Exercise & Equipment Safety: - Individual verbal instruction and demonstration of equipment use and safety with use of the equipment. Flowsheet Row Cardiac Rehab from 04/19/2021 in [facility] Cardiac and Pulmonary Rehab  Date 02/13/21  Educator AS  Instruction Review Code 1- Verbalizes Understanding      Education: Exercise Physiology & General Exercise Guidelines: - Group verbal and written instruction with models to review the exercise physiology of the cardiovascular system and associated critical values. Provides general exercise guidelines with specific guidelines to those with heart or lung disease.  Flowsheet Row Cardiac Rehab from 04/19/2021 in [facility] Cardiac and Pulmonary Rehab  Date 04/12/21  Educator AS  Instruction Review Code 1- Verbalizes Understanding      Education: Flexibility, Balance, Mind/Body Relaxation: - Group verbal and visual presentation with interactive activity on the components of exercise prescription. Introduces F.I.T.T principle from ACSM for exercise prescriptions. Reviews F.I.T.T. principles of flexibility and balance exercise training including progression. Also discusses the mind body connection.  Reviews various relaxation techniques to help reduce and manage stress (i.e. Deep  breathing, progressive muscle relaxation, and visualization). Balance handout provided to take home. Written material given at graduation. Flowsheet Row Cardiac Rehab from 04/19/2021 in [facility] Cardiac and Pulmonary Rehab  Date 03/01/21  Educator AS  Instruction Review Code 1- Verbalizes Understanding      Activity Barriers & Risk Stratification:  Activity Barriers & Cardiac Risk Stratification - 02/10/21 1306      Activity Barriers & Cardiac Risk Stratification   Activity Barriers Back Problems;Arthritis;Neck/Spine Problems;Other (comment)   arthritis in back and neck   Comments numbness in both feet related to arthritis    Cardiac Risk Stratification High           6 Minute Walk:  6 Minute Walk    Row Name 02/13/21 1505 04/12/21 1512       6 Minute Walk   Phase Initial Discharge    Distance 900 feet 1180 feet    Distance % Change -- 31.1 %    Distance Feet Change -- 280 ft    Walk Time 6 minutes 6 minutes    # of Rest Breaks 0 0    MPH 1.7 2.23  METS 2.82 2.98    RPE 11 11    Perceived Dyspnea  1 0    VO2 Peak 9.9 10.4    Symptoms No No    Resting HR 88 bpm 68 bpm    Resting BP 128/66 120/58    Resting Oxygen Saturation  96 % --    Exercise Oxygen Saturation  during 6 min walk 95 % --    Max Ex. HR 110 bpm 96 bpm    Max Ex. BP 154/74 128/60    2 Minute Post BP 124/64 --           Oxygen Initial Assessment:   Oxygen Re-Evaluation:   Oxygen Discharge (Final Oxygen Re-Evaluation):   Initial Exercise Prescription:  Initial Exercise Prescription - 02/13/21 1500      Date of Initial Exercise RX and Referring Provider   Date 02/13/21    Referring Provider [facility]      Treadmill   MPH 1.7    Grade 1.5    Minutes 15    METs 2.5      Recumbant Bike   Level 2    RPM 60    Minutes 15    METs 2.8      NuStep   Level 2    SPM 80    Minutes 15    METs 2.8      T5 Nustep   Level 1    SPM 80    Minutes 15    METs 2.8      Biostep-RELP   Level  2    SPM 50    Minutes 15    METs 2.5      Prescription Details   Frequency (times per week) 3    Duration Progress to 30 minutes of continuous aerobic without signs/symptoms of physical distress      Intensity   THRR 40-80% of Max Heartrate 111-134    Ratings of Perceived Exertion 11-13    Perceived Dyspnea 0-4      Progression   Progression Continue to progress workloads to maintain intensity without signs/symptoms of physical distress.      Resistance Training   Training Prescription Yes    Weight 3 lb    Reps 10-15           Perform Capillary Blood Glucose checks as needed.  Exercise Prescription Changes:   Exercise Prescription Changes    Row Name 02/13/21 1500 02/27/21 1200 03/02/21 1300 03/15/21 1100 03/28/21 1400     Response to Exercise   Blood Pressure (Admit) 128/66 122/72 -- 120/64 130/62   Blood Pressure (Exercise) 154/74 130/80 -- 134/60 134/68   Blood Pressure (Exit) 124/64 96/52 -- 104/56 120/60   Heart Rate (Admit) 88 bpm 79 bpm -- 88 bpm 52 bpm   Heart Rate (Exercise) 110 bpm 110 bpm -- 114 bpm 116 bpm   Heart Rate (Exit) 94 bpm 89 bpm -- 87 bpm 103 bpm   Oxygen Saturation (Admit) 96 % -- -- -- --   Oxygen Saturation (Exercise) 95 % -- -- -- --   Rating of Perceived Exertion (Exercise) 11 14 -- 12 12   Perceived Dyspnea (Exercise) 1 -- -- -- --   Symptoms none none -- none none   Duration -- Continue with 30 min of aerobic exercise without signs/symptoms of physical distress. -- Continue with 30 min of aerobic exercise without signs/symptoms of physical distress. Continue with 30 min of aerobic exercise without signs/symptoms of physical distress.  Intensity -- THRR unchanged -- THRR unchanged THRR unchanged     Progression   Progression -- Continue to progress workloads to maintain intensity without signs/symptoms of physical distress. -- Continue to progress workloads to maintain intensity without signs/symptoms of physical distress. Continue to  progress workloads to maintain intensity without signs/symptoms of physical distress.   Average METs -- 2.8 -- 2.27 2.85     Resistance Training   Training Prescription -- Yes -- Yes Yes   Weight -- 3 lb -- 3 lb 4 lb   Reps -- 10-15 -- 10-15 10-15     Interval Training   Interval Training -- -- -- No No     Treadmill   MPH -- -- -- 1.6 --   Grade -- -- -- 0.5 --   Minutes -- -- -- 15 --   METs -- -- -- 2.32 --     Recumbant Bike   Level -- -- -- -- 1   Minutes -- -- -- -- 15   METs -- -- -- -- 3.3     NuStep   Level -- 2 -- 2 2   SPM -- 80 -- -- --   Minutes -- 15 -- 15 15   METs -- 2.6 -- 2.5 2.4     Biostep-RELP   Level -- 1 -- 2 --   SPM -- 50 -- -- --   Minutes -- 15 -- 15 --   METs -- 3 -- 2 --     Home Exercise Plan   Plans to continue exercise at -- -- Home (comment)  treadmill, bike, youtube videos Home (comment)  treadmill, bike, [employer] Home (comment)  treadmill, bike, youtube videos   Frequency -- -- Add 2 additional days to program exercise sessions. Add 2 additional days to program exercise sessions. Add 2 additional days to program exercise sessions.   Initial Home Exercises Provided -- -- 03/02/21 03/02/21 03/02/21   Row Name 04/10/21 1500             Response to Exercise   Blood Pressure (Admit) 132/70       Blood Pressure (Exercise) 132/70       Blood Pressure (Exit) 114/60       Heart Rate (Admit) 90 bpm       Heart Rate (Exercise) 104 bpm       Heart Rate (Exit) 94 bpm       Rating of Perceived Exertion (Exercise) 14       Symptoms none       Duration Continue with 30 min of aerobic exercise without signs/symptoms of physical distress.       Intensity THRR unchanged               Progression   Progression Continue to progress workloads to maintain intensity without signs/symptoms of physical distress.       Average METs 2.53               Resistance Training   Training Prescription Yes       Weight 4 lb       Reps 10-15                Interval Training   Interval Training No               Treadmill   MPH 2       Grade 2       Minutes 15  METs 3.08               NuStep   Level 1       Minutes 15       METs 2.5               Elliptical   Level 1       Speed 2.5       Minutes 2               T5 Nustep   Level 4       Minutes 15               Biostep-RELP   Level 1       Minutes 15       METs 2               Home Exercise Plan   Plans to continue exercise at Home (comment)  treadmill, bike, youtube videos       Frequency Add 2 additional days to program exercise sessions.       Initial Home Exercises Provided 03/02/21              Exercise Comments:   Exercise Comments    Row Name 02/15/21 1328 02/27/21 0937 02/27/21 1018 03/02/21 1348     Exercise Comments First full day of exercise!  Patient was oriented to gym and equipment including functions, settings, policies, and procedures.  Patient's individual exercise prescription and treatment plan were reviewed.  All starting workloads were established based on the results of the 6 minute walk test done at initial orientation visit.  The plan for exercise progression was also introduced and progression will be customized based on patient's performance and goals. Omer stated he had to take a ntg last night. Resolved his symptoms. He when asked stated that this is a regular occurance since he left the hospital. Reviewed the sign and symptoms of MI veresus angina. Steps he needs to follow if he uses NTG and it does not resolve the symptoms. Call 911!  He verbalized understanding. He does have an appt with his cardiologist next week and was advised to call his doctor before appoinment if symptoms persist. No concerns today with angina symptoms Reviewed home exercise with pt today.  Pt plans to use his treadmill, bicycle, and YouTube videos for exercise.  Reviewed THR, pulse, RPE, sign and symptoms, pulse oximetery and when to call 911 or MD.   Also discussed weather considerations and indoor options.  Pt voiced understanding.           Exercise Goals and Review:   Exercise Goals    Row Name 02/13/21 1523             Exercise Goals   Increase Physical Activity Yes       Intervention Provide advice, education, support and counseling about physical activity/exercise needs.;Develop an individualized exercise prescription for aerobic and resistive training based on initial evaluation findings, risk stratification, comorbidities and participant's personal goals.       Expected Outcomes Short Term: Attend rehab on a regular basis to increase amount of physical activity.;Long Term: Add in home exercise to make exercise part of routine and to increase amount of physical activity.;Long Term: Exercising regularly at least 3-5 days a week.       Increase Strength and Stamina Yes       Intervention Provide advice, education, support and counseling about physical activity/exercise needs.;Develop an individualized exercise prescription  for aerobic and resistive training based on initial evaluation findings, risk stratification, comorbidities and participant's personal goals.       Expected Outcomes Short Term: Increase workloads from initial exercise prescription for resistance, speed, and METs.;Short Term: Perform resistance training exercises routinely during rehab and add in resistance training at home;Long Term: Improve cardiorespiratory fitness, muscular endurance and strength as measured by increased METs and functional capacity (6MWT)       Able to understand and use rate of perceived exertion (RPE) scale Yes       Intervention Provide education and explanation on how to use RPE scale       Expected Outcomes Short Term: Able to use RPE daily in rehab to express subjective intensity level;Long Term:  Able to use RPE to guide intensity level when exercising independently       Able to understand and use Dyspnea scale Yes       Intervention  Provide education and explanation on how to use Dyspnea scale       Expected Outcomes Short Term: Able to use Dyspnea scale daily in rehab to express subjective sense of shortness of breath during exertion;Long Term: Able to use Dyspnea scale to guide intensity level when exercising independently       Knowledge and understanding of Target Heart Rate Range (THRR) Yes       Intervention Provide education and explanation of THRR including how the numbers were predicted and where they are located for reference       Expected Outcomes Short Term: Able to state/look up THRR;Short Term: Able to use daily as guideline for intensity in rehab;Long Term: Able to use THRR to govern intensity when exercising independently       Able to check pulse independently Yes       Intervention Provide education and demonstration on how to check pulse in carotid and radial arteries.;Review the importance of being able to check your own pulse for safety during independent exercise       Expected Outcomes Short Term: Able to explain why pulse checking is important during independent exercise;Long Term: Able to check pulse independently and accurately       Understanding of Exercise Prescription Yes       Intervention Provide education, explanation, and written materials on patient's individual exercise prescription       Expected Outcomes Short Term: Able to explain program exercise prescription;Long Term: Able to explain home exercise prescription to exercise independently              Exercise Goals Re-Evaluation :  Exercise Goals Re-Evaluation    [place] Name 02/15/21 1328 02/27/21 1244 03/02/21 1348 03/08/21 1337 03/15/21 1116     Exercise Goal Re-Evaluation   Exercise Goals Review Increase Physical Activity;Able to understand and use rate of perceived exertion (RPE) scale;Knowledge and understanding of Target Heart Rate Range (THRR);Understanding of Exercise Prescription;Increase Strength and Stamina;Able to understand  and use Dyspnea scale;Able to check pulse independently Increase Physical Activity;Increase Strength and Stamina Increase Physical Activity;Increase Strength and Stamina;Able to understand and use rate of perceived exertion (RPE) scale;Knowledge and understanding of Target Heart Rate Range (THRR) Increase Physical Activity;Increase Strength and Stamina;Understanding of Exercise Prescription Increase Physical Activity;Increase Strength and Stamina;Understanding of Exercise Prescription   Comments Reviewed RPE and dyspnea scales, THR and program prescription with pt today.  Pt voiced understanding and was given a copy of goals to take home. Shepherd is doing well in rehab.  He works at [employer].  Staff will monitor progress. Home exercise reviewed with patient. He plans to use his treadmill, bike, and YouTube videos to exercise at home. Patient voiced understanding of THRR and RPE scale Philbert is doing well in rehab.  He is feeling pretty good overall.  He has gotten started with exercise at home and rescued the former clothing racks.  He has not noticed much change in his strength and stamina yet, but can tell that it is going to start getting better. Benito has been doing well in rehab.  He is up to 2.5 METs on the NuStep. He should be ready to start to increase his workloads.  We will continue to monitor his progress.   Expected Outcomes Short: Use RPE daily to regulate intensity. Long: Follow program prescription in THR. Short:  attend consistently Long:  improve overall stamina Short: add 2-3 days of exercise. Long: maintain independence in exercising on own. Short: Continue to add in exercise at home Long: Continue to improve stamina. Short: Increase workloads Long: Continue to improve stamina.   [place] Name 03/28/21 1454 04/05/21 1344 04/10/21 1504         Exercise Goal Re-Evaluation   Exercise Goals Review Increase Physical Activity;Increase Strength and Stamina Increase Physical Activity;Increase  Strength and Stamina;Understanding of Exercise Prescription Increase Physical Activity;Increase Strength and Stamina;Understanding of Exercise Prescription     Comments Kimmy has been attending consistently and works in [employer] range.  We will continue to monitor progress. Givanni is doing well in rehab.  He is doing well with his exercise. He has noticed that exercise is helping.  He has some equipment at home that he uses and plans to continue to use after graduation.  He is also looking forward to working in the yard again this year.  He went out Saturday and just took his time with it. Elek is due for his post 6MWT next visit and we expect to see an improvement in his score.  He is planning to use his treadmill and bike at home still after graduation.  He tried the elliptical last week and was able to get 2 min!!  We will continue to monitor his progress.     Expected Outcomes Short:  increase workloads Long: improve overall MET level Short: Improve post 6MWT  Long: Continue to improve stamina. Short: Improve post 6MWT  Long: Continue to improve stamina.            Discharge Exercise Prescription (Final Exercise Prescription Changes):  Exercise Prescription Changes - 04/10/21 1500      Response to Exercise   Blood Pressure (Admit) 132/70    Blood Pressure (Exercise) 132/70    Blood Pressure (Exit) 114/60    Heart Rate (Admit) 90 bpm    Heart Rate (Exercise) 104 bpm    Heart Rate (Exit) 94 bpm    Rating of Perceived Exertion (Exercise) 14    Symptoms none    Duration Continue with 30 min of aerobic exercise without signs/symptoms of physical distress.    Intensity THRR unchanged      Progression   Progression Continue to progress workloads to maintain intensity without signs/symptoms of physical distress.    Average METs 2.53      Resistance Training   Training Prescription Yes    Weight 4 lb    Reps 10-15      Interval Training   Interval Training No      Treadmill   MPH 2     Grade 2  Minutes 15    METs 3.08      NuStep   Level 1    Minutes 15    METs 2.5      Elliptical   Level 1    Speed 2.5    Minutes 2      T5 Nustep   Level 4    Minutes 15      Biostep-RELP   Level 1    Minutes 15    METs 2      Home Exercise Plan   Plans to continue exercise at Home (comment)   treadmill, bike, youtube videos   Frequency Add 2 additional days to program exercise sessions.    Initial Home Exercises Provided 03/02/21           Nutrition:  Target Goals: Understanding of nutrition guidelines, daily intake of sodium <159m, cholesterol <2049m calories 30% from fat and 7% or less from saturated fats, daily to have 5 or more servings of fruits and vegetables.  Education: All About Nutrition: -Group instruction provided by verbal, written material, interactive activities, discussions, models, and posters to present general guidelines for heart healthy nutrition including fat, fiber, MyPlate, the role of sodium in heart healthy nutrition, utilization of the nutrition label, and utilization of this knowledge for meal planning. Follow up email sent as well. Written material given at graduation. [facility]  Date 03/08/21  [facility] Review Code 1- Verbalizes Understanding      Biometrics:  Pre Biometrics - 02/13/21 1524      Pre Biometrics   Height _0  (1.778 m)    Weight 136 lb (61.7 kg)    BMI (Calculated) 19.51    Single Leg Stand 3 seconds            Nutrition Therapy Plan and Nutrition Goals:  Nutrition Therapy & Goals - 02/13/21 1428      Nutrition Therapy   Diet Heart healthy, low Na    Drug/Food Interactions Statins/Certain Fruits    Protein (specify units) 70g    Fiber 30 grams    Whole Grain Foods 3 servings    Saturated Fats 12 max. grams    Fruits and Vegetables 8 servings/day    Sodium 1.5 grams      Personal Nutrition Goals   Nutrition  Goal ST: add nuts or peanut butter to breakfast, add in protein snack like boiled egg, try whole wheat bread LT: gain back muscle    Comments His wife is a chBiomedical scientistnd she cooks low salt, baked, limits fried food. He will have cereal during the week. Wife uses olive oil when cooking. He eats fish 1x/week, cut down on red meat, pork chop baked once in awhile, will eat baked chicken. He likes mashed potatoes, green bean, peas, and fruit. B: cereal (cheerios) - whole milk with some fruit. Green tea (lemon and honey). D: meat, vegetables (starch/non-starchy). He uses white wheat bread, but will try whole wheat bread, he also like whole wheat pasta and brown rice. He reports his weight has gone down ~6lbs since heart event due to low appetite. Discussed heart healthy eating and how to increase calories and protein to gain back muscle.      Intervention Plan   Intervention Prescribe, educate and counsel regarding individualized specific dietary modifications aiming towards targeted core components such as weight, hypertension, lipid management, diabetes, heart failure and other comorbidities.;Nutrition handout(s) given to patient.  Expected Outcomes Short Term Goal: Understand basic principles of dietary content, such as calories, fat, sodium, cholesterol and nutrients.;Short Term Goal: A plan has been developed with personal nutrition goals set during dietitian appointment.;Long Term Goal: Adherence to prescribed nutrition plan.           Nutrition Assessments:  MEDIFICTS Score Key:  ?70 Need to make dietary changes   40-70 Heart Healthy Diet  ? 40 Therapeutic Level Cholesterol Diet  Flowsheet Row Cardiac Rehab from 04/19/2021 in [facility] Cardiac and Pulmonary Rehab  Picture Your Plate Total Score on Discharge 67     Picture Your Plate Scores:  <63 Unhealthy dietary pattern with much room for improvement.  41-50 Dietary pattern unlikely to meet recommendations for good health and room for  improvement.  51-60 More healthful dietary pattern, with some room for improvement.   >60 Healthy dietary pattern, although there may be some specific behaviors that could be improved.    Nutrition Goals Re-Evaluation:  Nutrition Goals Re-Evaluation    Tatum Name 03/08/21 1342 04/05/21 1354           Goals   Nutrition Goal ST: add nuts or peanut butter to breakfast, add in protein snack like boiled egg, try whole wheat bread LT: gain back muscle Short: Continue to try new protein options Long: continue to make heart healthy choices      Comment Kylie is doing well with his diet.  He has tried to add in more protein and making more grain based cookies.  He has tried some kellog nutrigrain bars as snack options.  He is tring a variety on new protein sources to see what he likes. Chalres is doing well with his diet.  He is getting in more protein in his diet and trying to eat better overall.  He is dong better with snacking too.      Expected Outcome Short: Continue to try new protein options Long: continue to make heart healthy choices Short: continue to add in protein Long; Continue with healthy diet.             Nutrition Goals Discharge (Final Nutrition Goals Re-Evaluation):  Nutrition Goals Re-Evaluation - 04/05/21 1354      Goals   Nutrition Goal Short: Continue to try new protein options Long: continue to make heart healthy choices    Comment Chalres is doing well with his diet.  He is getting in more protein in his diet and trying to eat better overall.  He is dong better with snacking too.    Expected Outcome Short: continue to add in protein Long; Continue with healthy diet.           Psychosocial: Target Goals: Acknowledge presence or absence of significant depression and/or stress, maximize coping skills, provide positive support system. Participant is able to verbalize types and ability to use techniques and skills needed for reducing stress and depression.    Education: Stress, Anxiety, and Depression - Group verbal and visual presentation to define topics covered.  Reviews how body is impacted by stress, anxiety, and depression.  Also discusses healthy ways to reduce stress and to treat/manage anxiety and depression.  Written material given at graduation.   Education: Sleep Hygiene -Provides group verbal and written instruction about how sleep can affect your health.  Define sleep hygiene, discuss sleep cycles and impact of sleep habits. Review good sleep hygiene tips.    Initial Review & Psychosocial Screening:  Initial Psych Review & Screening - 02/10/21 1308  Initial Review   Current issues with Current Stress Concerns    Source of Stress Concerns Unable to participate in former interests or hobbies;Unable to perform yard/household activities      [place]? Yes   wife     Barriers   Psychosocial barriers to participate in program There are no identifiable barriers or psychosocial needs.;The patient should benefit from training in stress management and relaxation.      Screening Interventions   Interventions Encouraged to exercise;Provide feedback about the scores to participant;To provide support and resources with identified psychosocial needs    Expected Outcomes Short Term goal: Utilizing psychosocial counselor, staff and physician to assist with identification of specific Stressors or current issues interfering with healing process. Setting desired goal for each stressor or current issue identified.;Long Term Goal: Stressors or current issues are controlled or eliminated.;Short Term goal: Identification and review with participant of any Quality of Life or Depression concerns found by scoring the questionnaire.;Long Term goal: The participant improves quality of Life and PHQ9 Scores as seen by post scores and/or verbalization of changes           Quality of Life Scores:   Quality of Life -  04/19/21 1358      Quality of Life   Select Quality of Life      Quality of Life Scores   Health/Function Pre 20.93 %    Health/Function Post 22.97 %    Health/Function % Change 9.75 %    Socioeconomic Pre 23.5 %    Socioeconomic Post 23.29 %    Socioeconomic % Change  -0.89 %    Psych/Spiritual Pre 29.29 %    Psych/Spiritual Post 24.86 %    Psych/Spiritual % Change -15.12 %    Family Pre 20.5 %    Family Post 22.9 %    Family % Change 11.71 %    GLOBAL Pre 23.19 %    GLOBAL Post 23.41 %    GLOBAL % Change 0.95 %          Scores of 19 and below usually indicate a poorer quality of life in these areas.  A difference of  2-3 points is a clinically meaningful difference.  A difference of 2-3 points in the total score of the Quality of Life Index has been associated with significant improvement in overall quality of life, self-image, physical symptoms, and general health in studies assessing change in quality of life.  PHQ-9: Recent Review Flowsheet Data    Depression screen [facility] 2/9 04/19/2021 02/13/2021   Decreased Interest 0 0   Down, Depressed, Hopeless 0 0   PHQ - 2 Score 0 0   Altered sleeping 0 0   Tired, decreased energy 1 1   Change in appetite 0 1   Feeling bad or failure about yourself  0 0   Trouble concentrating 0 0   Moving slowly or fidgety/restless 0 0   Suicidal thoughts 0 0   PHQ-9 Score 1 2   Difficult doing work/chores Not difficult at all Not difficult at all     Interpretation of Total Score  Total Score Depression Severity:  1-4 = Minimal depression, 5-9 = Mild depression, 10-14 = Moderate depression, 15-19 = Moderately severe depression, 20-27 = Severe depression   Psychosocial Evaluation and Intervention:  Psychosocial Evaluation - 02/10/21 1312      Psychosocial Evaluation & Interventions   Interventions Encouraged to exercise with the program and follow exercise prescription  Comments 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 He had  some chest discomfort soon after discharge that resulted in him taking nitro and led to low blood pressure. Thankfully he hasn't had any more since then and has been feeling better after some medication adjustment. His biggest stressor right now is building back up his stamina so he is excited to start cardiac rehab. His wife is his main support system and feels like they are doing well working together after his MI. He is retired but keeps to the same sleep schedule of going to bed late and waking up later.    Expected Outcomes Short: attend cardiac rehab for education and exercise. Long: develop and maintain positive self care habits.    Continue Psychosocial Services  Follow up required by staff           Psychosocial Re-Evaluation:  Psychosocial Re-Evaluation    Higgins Name 03/08/21 1339 04/05/21 1347           Psychosocial Re-Evaluation   Current issues with Current Stress Concerns None Identified      Comments Lynden is doing well in rehab.  He is getting better and more able to help with dishes and activities around the house.  He is hoping to be able to get back to work done outside when the time comes.  He is sleeping pretty good, other than trips to the bathroom. He usually sleeps around 6-8 hrs each night. He denies any currently major stressors, just the daily life stresses. Jiovanny is doing well in rehab still.  He is working towards graduation.  He tries to just breathe his way through stress and shakes it off to move forward.  He is back to working in yard and still sleeping well. Overall, he is positive and has a good outlook.  He has enjoyed rehab and being amongst others going through the same thing he is.  It has also been good to get his stamina back again.      Expected Outcomes Short: Continue to exericse to build stamina to do what he wants  Long: Continue to stay positive. Short: Continue to work towards graduation Long: Continue to stay positive      Interventions --  Encouraged to attend Cardiac Rehabilitation for the exercise      Continue Psychosocial Services  -- Follow up required by staff             Psychosocial Discharge (Final Psychosocial Re-Evaluation):  Psychosocial Re-Evaluation - 04/05/21 1347      Psychosocial Re-Evaluation   Current issues with None Identified    Comments Reda is doing well in rehab still.  He is working towards graduation.  He tries to just breathe his way through stress and shakes it off to move forward.  He is back to working in yard and still sleeping well. Overall, he is positive and has a good outlook.  He has enjoyed rehab and being amongst others going through the same thing he is.  It has also been good to get his stamina back again.    Expected Outcomes Short: Continue to work towards graduation Long: Continue to stay positive    Interventions Encouraged to attend Cardiac Rehabilitation for the exercise    Continue Psychosocial Services  Follow up required by staff           Vocational Rehabilitation: Provide vocational rehab assistance to qualifying candidates.   Vocational Rehab Evaluation & Intervention:  Vocational Rehab - 02/10/21 5374  Initial Vocational Rehab Evaluation & Intervention   Assessment shows need for Vocational Rehabilitation No           Education: Education Goals: Education classes will be provided on a variety of topics geared toward better understanding of heart health and risk factor modification. Participant will state understanding/return demonstration of topics presented as noted by education test scores.  Learning Barriers/Preferences:  Learning Barriers/Preferences - 02/10/21 1305      Learning Barriers/Preferences   Learning Barriers None    Learning Preferences None           General Cardiac Education Topics:  AED/CPR: - Group verbal and written instruction with the use of models to demonstrate the basic use of the AED with the basic ABC's of  resuscitation.   Anatomy and Cardiac Procedures: - Group verbal and visual presentation and models provide information about basic cardiac anatomy and function. Reviews the testing methods done to diagnose heart disease and the outcomes of the test results. Describes the treatment choices: Medical Management, Angioplasty, or Coronary Bypass Surgery for treating various heart conditions including Myocardial Infarction, Angina, Valve Disease, and Cardiac Arrhythmias.  Written material given at graduation. Flowsheet Row Cardiac Rehab from 04/19/2021 in [facility] Cardiac and Pulmonary Rehab  Date 02/22/21  Educator [facility]  Instruction Review Code 1- Verbalizes Understanding      Medication Safety: - Group verbal and visual instruction to review commonly prescribed medications for heart and lung disease. Reviews the medication, class of the drug, and side effects. Includes the steps to properly store meds and maintain the prescription regimen.  Written material given at graduation. Flowsheet Row Cardiac Rehab from 04/19/2021 in [facility] Cardiac and Pulmonary Rehab  Date 03/15/21  Educator [facility]  Instruction Review Code 1- Verbalizes Understanding      Intimacy: - Group verbal instruction through game format to discuss how heart and lung disease can affect sexual intimacy. Written material given at graduation.. Flowsheet Row Cardiac Rehab from 04/19/2021 in [facility] Cardiac and Pulmonary Rehab  Date 04/19/21  Educator AS  Instruction Review Code 1- Verbalizes Understanding      Know Your Numbers and Heart Failure: - Group verbal and visual instruction to discuss disease risk factors for cardiac and pulmonary disease and treatment options.  Reviews associated critical values for Overweight/Obesity, Hypertension, Cholesterol, and Diabetes.  Discusses basics of heart failure: signs/symptoms and treatments.  Introduces Heart Failure Zone chart for action plan for heart failure.  Written material given at  graduation. Flowsheet Row Cardiac Rehab from 04/19/2021 in [facility] Cardiac and Pulmonary Rehab  Date 03/22/21  Educator SB  Instruction Review Code 1- Verbalizes Understanding      Infection Prevention: - Provides verbal and written material to individual with discussion of infection control including proper hand washing and proper equipment cleaning during exercise session. Flowsheet Row Cardiac Rehab from 04/19/2021 in [facility] Cardiac and Pulmonary Rehab  Date 02/13/21  Educator AS  Instruction Review Code 1- Verbalizes Understanding      Falls Prevention: - Provides verbal and written material to individual with discussion of falls prevention and safety. Flowsheet Row Cardiac Rehab from 04/19/2021 in [facility] Cardiac and Pulmonary Rehab  Date 02/13/21  Educator AS  Instruction Review Code 1- Verbalizes Understanding      Other: -Provides group and verbal instruction on various topics (see comments)   Knowledge Questionnaire Score:  Knowledge Questionnaire Score - 04/19/21 1359      Knowledge Questionnaire Score   Post Score 25/26  Core Components/Risk Factors/Patient Goals at Admission:  Personal Goals and Risk Factors at Admission - 02/13/21 1525      Core Components/Risk Factors/Patient Goals on Admission    Weight Management Yes;Weight Gain    Admit Weight 136 lb (61.7 kg)    Goal Weight: Short Term 140 lb (63.5 kg)    Goal Weight: Long Term 145 lb (65.8 kg)    Expected Outcomes Short Term: Continue to assess and modify interventions until short term weight is achieved;Long Term: Adherence to nutrition and physical activity/exercise program aimed toward attainment of established weight goal;Weight Gain: Understanding of general recommendations for a high calorie, high protein meal plan that promotes weight gain by distributing calorie intake throughout the day with the consumption for 4-5 meals, snacks, and/or supplements    Hypertension Yes    Intervention Provide  education on lifestyle modifcations including regular physical activity/exercise, weight management, moderate sodium restriction and increased consumption of fresh fruit, vegetables, and low fat dairy, alcohol moderation, and smoking cessation.;Monitor prescription use compliance.    Expected Outcomes Short Term: Continued assessment and intervention until BP is < 140/91m HG in hypertensive participants. < 130/856mHG in hypertensive participants with diabetes, heart failure or chronic kidney disease.;Long Term: Maintenance of blood pressure at goal levels.    Lipids Yes    Intervention Provide education and support for participant on nutrition & aerobic/resistive exercise along with prescribed medications to achieve LDL <7044mHDL >50m32m  Expected Outcomes Short Term: Participant states understanding of desired cholesterol values and is compliant with medications prescribed. Participant is following exercise prescription and nutrition guidelines.;Long Term: Cholesterol controlled with medications as prescribed, with individualized exercise RX and with personalized nutrition plan. Value goals: LDL < 70mg70mL > 40 mg.           Education:Diabetes - Individual verbal and written instruction to review signs/symptoms of diabetes, desired ranges of glucose level fasting, after meals and with exercise. Acknowledge that pre and post exercise glucose checks will be done for 3 sessions at entry of program.   Core Components/Risk Factors/Patient Goals Review:   Goals and Risk Factor Review    Row Name 03/08/21 1344 04/05/21 1349           Core Components/Risk Factors/Patient Goals Review   Personal Goals Review Weight Management/Obesity;Hypertension;Lipids Weight Management/Obesity;Hypertension;Lipids      Review CharlNalulost 10 lb initially with his event.  But not  he is around 146 lb and holding steady.  He is trying to eat more protein to help regain muscle.  His pressures are doing well and  he is checking them at home about 1x day.  Overall he is doing well.  He had a follow up with Dr. CallwClayborn Bigness week.  He had asked some questions about his NTG use with him after he eats. We talked about different ways to help differentiate between chest pain and indejestion.  We will continue to keep a close eye on him. CharlVandyoing well, he is holding his weight steady.  He is trying to eat better and getting lots of protein to help.  He continues to have good pressures.  He sees cardiologist tomorrow. He takes tums to to help, but still having some chest pain with it.      Expected Outcomes Short: Continue to monitor chest pain and  work weight gain Long: Continue to monitor risk factors. Short: Talk to cardiologist about chest pain  Long: Continue to monitor risk factors.  Core Components/Risk Factors/Patient Goals at Discharge (Final Review):   Goals and Risk Factor Review - 04/05/21 1349      Core Components/Risk Factors/Patient Goals Review   Personal Goals Review Weight Management/Obesity;Hypertension;Lipids    Review Veto is doing well, he is holding his weight steady.  He is trying to eat better and getting lots of protein to help.  He continues to have good pressures.  He sees cardiologist tomorrow. He takes tums to to help, but still having some chest pain with it.    Expected Outcomes Short: Talk to cardiologist about chest pain  Long: Continue to monitor risk factors.           ITP Comments:  ITP Comments    Row Name 02/10/21 1315 02/13/21 1538 02/15/21 1328 02/22/21 0718 02/27/21 0933   ITP Comments Initial telephone orientation completed. Diagnosis can be found in [facility] 1/13. EP orientation scheduled for Monday 2/14 at 1pm. Completed 6MWT and gym orientation. Initial ITP created and sent for review to Dr. Emily Filbert, Medical Director. First full day of exercise!  Patient was oriented to gym and equipment including functions, settings, policies, and procedures.   Patient's individual exercise prescription and treatment plan were reviewed.  All starting workloads were established based on the results of the 6 minute walk test done at initial orientation visit.  The plan for exercise progression was also introduced and progression will be customized based on patient's performance and goals. 30 Day review completed. Medical Director ITP review done, changes made as directed, and signed approval by Medical Director.  New to program Ravi stated he had to take a ntg last night. Resolved his symptoms. He when asked stated that this is a regular occurance since he left the hospital. Reviewed the sign and symptoms of MI veresus angina. Steps he needs to follow if he uses NTG and it does not resolve the symptoms. Call 911!  He verbalized understanding. He does have an appt with his cardiologist next week and was advised to call his doctor before appoinment if symptoms persist.   Row Name 04/19/21 0747 04/19/21 1339         ITP Comments 30 Day review completed. Medical Director ITP review done, changes made as directed, and signed approval by Medical Director. Cayden graduated today from  rehab with 36 sessions completed.  Details of the patient's exercise prescription and what He needs to do in order to continue the prescription and progress were discussed with patient.  Patient was given a copy of prescription and goals.  Patient verbalized understanding.  Arash plans to continue to exercise by using his treadmill and bike.             Comments: Discharge ITP

## 2021-04-28 ENCOUNTER — Other Ambulatory Visit: Payer: Self-pay | Admitting: Physician Assistant

## 2021-06-28 IMAGING — DX DG CHEST 1V
1 series · 1 of 1 positions shown · non-contrast
Comparison: 01/08/2021

CLINICAL DATA: Chest pain

EXAM:
CHEST  1 VIEW

[chest ap]
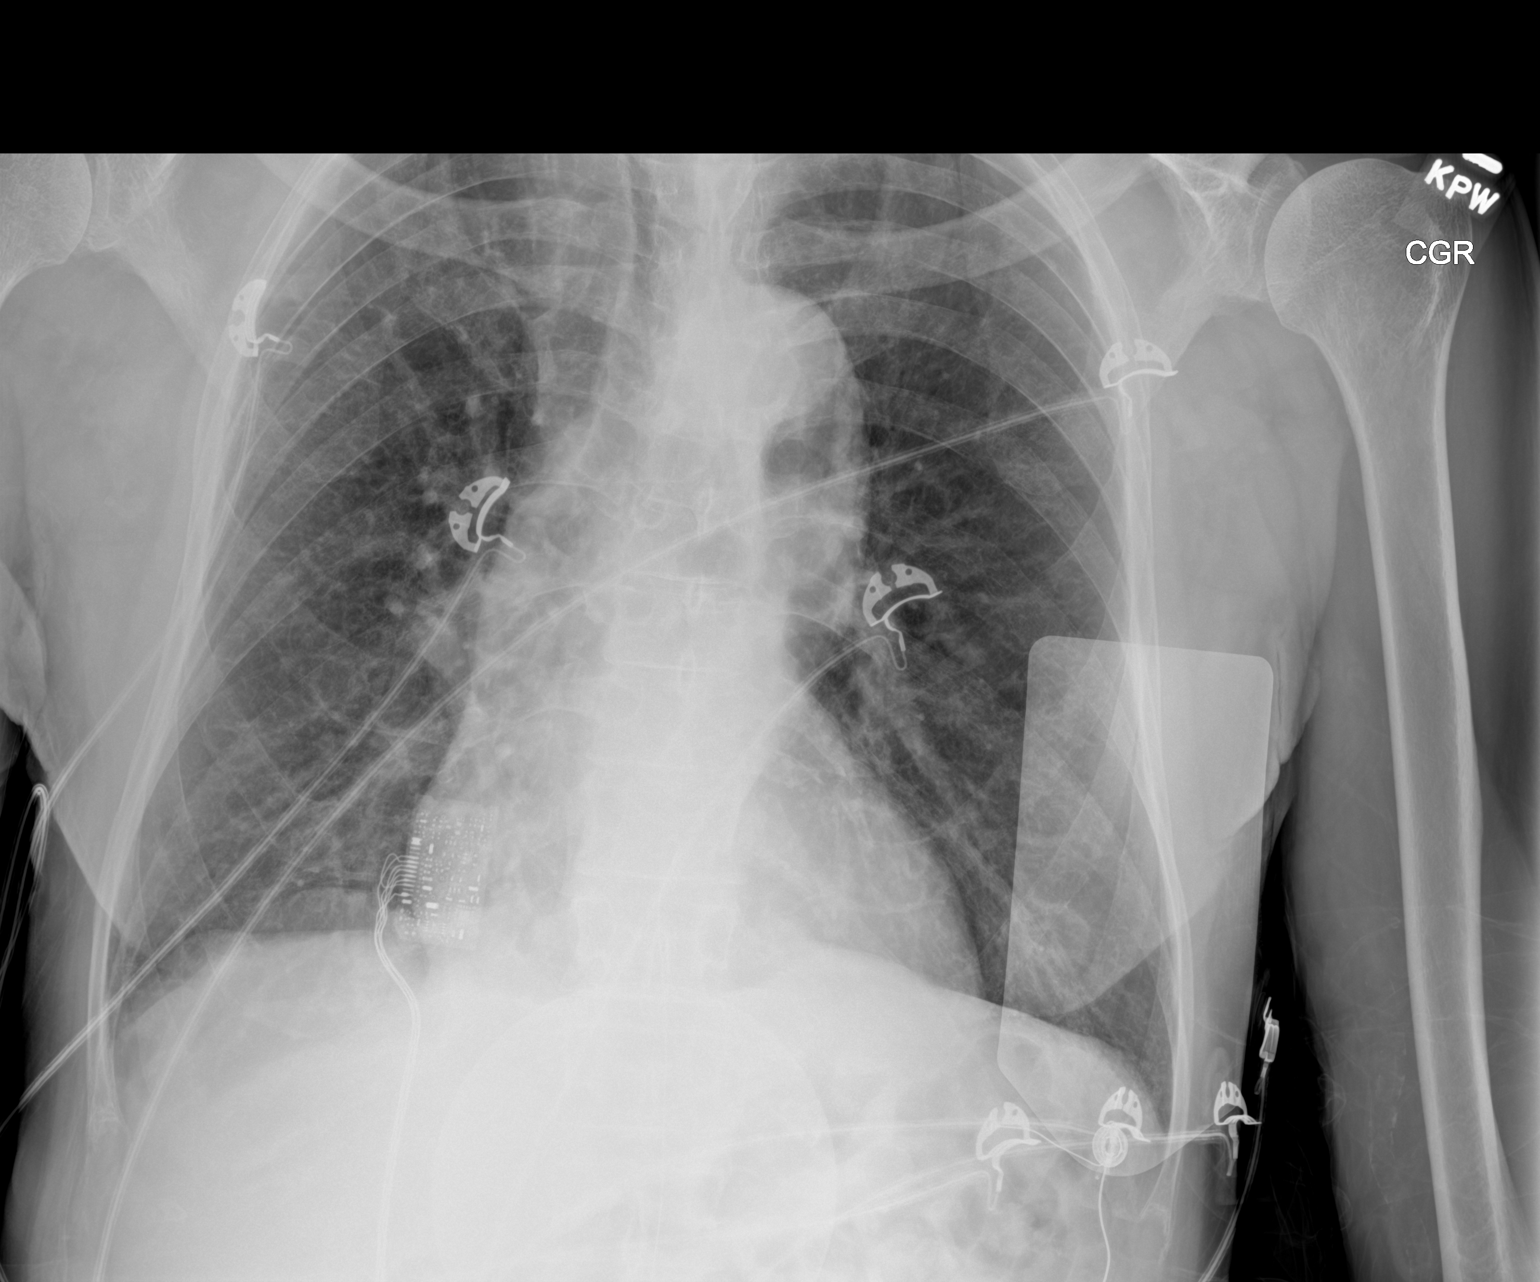

[1 of 1 positions shown; findings below may reference images not displayed]

FINDINGS: The heart size and mediastinal contours are within normal limits.
Slightly hyperinflated lungs. No focal airspace consolidation,
pleural effusion, or pneumothorax. The visualized skeletal
structures are unremarkable.
IMPRESSION: No active disease.

## 2021-08-24 DIAGNOSIS — N189 Chronic kidney disease, unspecified: Secondary | ICD-10-CM | POA: Insufficient documentation

## 2021-08-24 DIAGNOSIS — R809 Proteinuria, unspecified: Secondary | ICD-10-CM | POA: Insufficient documentation

## 2021-08-24 DIAGNOSIS — R8290 Unspecified abnormal findings in urine: Secondary | ICD-10-CM | POA: Insufficient documentation

## 2021-08-24 DIAGNOSIS — I25719 Atherosclerosis of autologous vein coronary artery bypass graft(s) with unspecified angina pectoris: Secondary | ICD-10-CM | POA: Insufficient documentation

## 2021-08-24 DIAGNOSIS — D631 Anemia in chronic kidney disease: Secondary | ICD-10-CM | POA: Insufficient documentation

## 2021-08-25 ENCOUNTER — Other Ambulatory Visit (HOSPITAL_COMMUNITY): Payer: Self-pay | Admitting: Nephrology

## 2021-08-25 ENCOUNTER — Other Ambulatory Visit: Payer: Self-pay | Admitting: Nephrology

## 2021-08-25 DIAGNOSIS — R809 Proteinuria, unspecified: Secondary | ICD-10-CM

## 2021-08-25 DIAGNOSIS — R8290 Unspecified abnormal findings in urine: Secondary | ICD-10-CM

## 2021-08-25 DIAGNOSIS — I25719 Atherosclerosis of autologous vein coronary artery bypass graft(s) with unspecified angina pectoris: Secondary | ICD-10-CM

## 2021-08-25 DIAGNOSIS — N1831 Chronic kidney disease, stage 3a: Secondary | ICD-10-CM

## 2021-09-28 ENCOUNTER — Ambulatory Visit: Payer: Medicare Other | Attending: Nephrology

## 2021-10-11 ENCOUNTER — Ambulatory Visit
Admission: RE | Admit: 2021-10-11 | Discharge: 2021-10-11 | Disposition: A | Discharge status: Home / Self Care | Payer: Medicare Other | Source: Ambulatory Visit | Attending: Nephrology | Admitting: Nephrology

## 2021-10-11 ENCOUNTER — Other Ambulatory Visit: Payer: Self-pay

## 2021-10-11 DIAGNOSIS — R809 Proteinuria, unspecified: Secondary | ICD-10-CM | POA: Diagnosis present

## 2021-10-11 DIAGNOSIS — R8290 Unspecified abnormal findings in urine: Secondary | ICD-10-CM | POA: Insufficient documentation

## 2021-10-11 DIAGNOSIS — I25719 Atherosclerosis of autologous vein coronary artery bypass graft(s) with unspecified angina pectoris: Secondary | ICD-10-CM | POA: Diagnosis present

## 2022-02-05 NOTE — Progress Notes (Signed)
MRN : [id number]  Carl Townsend is a 76 y.o. (Sep 11, 1946) male who presents with chief complaint of check circulation.  History of Present Illness:   The patient returns to the office for followup and review of the noninvasive studies. There has been a significant deterioration in the lower extremity symptoms.  The patient notes interval shortening of their claudication distance and development of mild rest pain symptoms. No new ulcers or wounds have occurred since the last visit.  There have been no significant changes to the patient's overall health care.  The patient denies amaurosis fugax or recent TIA symptoms. There are no recent neurological changes noted. The patient denies history of DVT, PE or superficial thrombophlebitis. The patient denies recent episodes of angina or shortness of breath.   ABI's Rt=[place] TBI=0.71 and Lt=1.06 TBI=0.55 (previous ABI's Rt=[place] TBI=0 stable .61 and Lt=0.88 TBI=0.55)   No outpatient medications have been marked as taking for the 02/08/22 encounter (Appointment) with Delana Meyer, Dolores Lory, MD.    Past Medical History:  Diagnosis Date   Arthritis    neck and shoulders   Chronic kidney disease    chronic kidney disease - stage 3, GFR 30-59 ml/min   Colon cancer [facility])    ED (erectile dysfunction)    History of 2019 novel coronavirus disease (COVID-19) 08/23/2020   Hyperlipidemia    Hypertension    Inguinal hernia    Pre-diabetes     Past Surgical History:  Procedure Laterality Date   COLON SURGERY Right 05/06   colectomy and sigmoid colectomy    COLONOSCOPY WITH PROPOFOL N/A 03/13/2016   Procedure: COLONOSCOPY WITH PROPOFOL;  Surgeon: Lollie Sails, MD;  Location: [facility] ENDOSCOPY;  Service: Endoscopy;  Laterality: N/A;   COLONOSCOPY WITH PROPOFOL N/A 03/09/2020   Procedure: COLONOSCOPY WITH PROPOFOL;  Surgeon: Toledo, Benay Pike, MD;  Location: [facility] ENDOSCOPY;  Service: Gastroenterology;  Laterality: N/A;   CORONARY STENT INTERVENTION  N/A 01/12/2021   Procedure: CORONARY STENT INTERVENTION;  Surgeon: Belva Crome, MD;  Location: [place] CV LAB;  Service: Cardiovascular;  Laterality: N/A;   EYE SURGERY Left    vitreous retinal surgery   IABP INSERTION N/A 01/12/2021   Procedure: IABP Insertion;  Surgeon: Yolonda Kida, MD;  Location: [place] CV LAB;  Service: Cardiovascular;  Laterality: N/A;   INTRAVASCULAR ULTRASOUND/IVUS N/A 01/12/2021   Procedure: Intravascular Ultrasound/IVUS;  Surgeon: Belva Crome, MD;  Location: [place] CV LAB;  Service: Cardiovascular;  Laterality: N/A;   LEFT HEART CATH AND CORONARY ANGIOGRAPHY N/A 01/12/2021   Procedure: LEFT HEART CATH AND CORONARY ANGIOGRAPHY possible PCI and stent;  Surgeon: Yolonda Kida, MD;  Location: [place] CV LAB;  Service: Cardiovascular;  Laterality: N/A;   right foot surgery     XI ROBOTIC ASSISTED INGUINAL HERNIA REPAIR WITH MESH Bilateral 11/10/2020   Procedure: XI ROBOTIC ASSISTED INGUINAL HERNIA REPAIR WITH MESH;  Surgeon: Benjamine Sprague, DO;  Location: [facility] ORS;  Service: General;  Laterality: Bilateral;    Social History Social History   Tobacco Use   Smoking status: Former    Types: Cigarettes    Quit date: 2000    Years since quitting: 23.1   Smokeless tobacco: Never  Vaping Use   Vaping Use: Never used  Substance Use Topics   Alcohol use: No    Comment: glass of wine "once in a while"    Drug use: No    Family History Family History  Problem Relation Age of Onset  Heart failure Mother    Heart attack Father    Heart attack Brother        Multiple MI's; died in his 66s   Heart disease Brother        Multiple PCI's first in their 46s    No Known Allergies   REVIEW OF SYSTEMS (Negative unless checked)  Constitutional: [] Weight loss  [] Fever  [] Chills Cardiac: [] Chest pain   [] Chest pressure   [] Palpitations   [] Shortness of breath when laying flat   [] Shortness of breath with exertion. Vascular:  [x] Pain in  legs with walking   [] Pain in legs at rest  [] History of DVT   [] Phlebitis   [] Swelling in legs   [] Varicose veins   [] Non-healing ulcers Pulmonary:   [] Uses home oxygen   [] Productive cough   [] Hemoptysis   [] Wheeze  [] COPD   [] Asthma Neurologic:  [] Dizziness   [] Seizures   [] History of stroke   [] History of TIA  [] Aphasia   [] Vissual changes   [] Weakness or numbness in arm   [] Weakness or numbness in leg Musculoskeletal:   [] Joint swelling   [] Joint pain   [] Low back pain Hematologic:  [] Easy bruising  [] Easy bleeding   [] Hypercoagulable state   [] Anemic Gastrointestinal:  [] Diarrhea   [] Vomiting  [] Gastroesophageal reflux/heartburn   [] Difficulty swallowing. Genitourinary:  [] Chronic kidney disease   [] Difficult urination  [] Frequent urination   [] Blood in urine Skin:  [] Rashes   [] Ulcers  Psychological:  [] History of anxiety   []  History of major depression.  Physical Examination  There were no vitals filed for this visit. There is no height or weight on file to calculate BMI. Gen: WD/WN, NAD Head: [place]/AT, No temporalis wasting.  Ear/Nose/Throat: Hearing grossly intact, nares w/o erythema or drainage Eyes: PER, EOMI, sclera nonicteric.  Neck: Supple, no masses.  No bruit or JVD.  Pulmonary:  Good air movement, no audible wheezing, no use of accessory muscles.  Cardiac: RRR, normal S1, S2, no Murmurs. Vascular:   Vessel Right Left  Radial Palpable Palpable  Carotid Palpable Palpable  PT Not Palpable Not Palpable  DP Not Palpable Not Palpable  Gastrointestinal: soft, non-distended. No guarding/no peritoneal signs.  Musculoskeletal: M/S 5/5 throughout.  No visible deformity.  Neurologic: CN 2-12 intact. Pain and light touch intact in extremities.  Symmetrical.  Speech is fluent. Motor exam as listed above. Psychiatric: Judgment intact, Mood & affect appropriate for pt's clinical situation. Dermatologic: No rashes or ulcers noted.  No changes consistent with cellulitis.   CBC Lab  Results  Component Value Date   WBC 7.0 01/13/2021   HGB 13.9 01/13/2021   HCT 40.3 01/13/2021   MCV 100.0 01/13/2021   PLT 240 01/13/2021    BMET    Component Value Date/Time   NA 136 01/13/2021 0109   K 4.0 01/13/2021 0109   CL 104 01/13/2021 0109   CO2 21 (L) 01/13/2021 0109   GLUCOSE 92 01/13/2021 0109   BUN 20 01/13/2021 0109   CREATININE 1.32 (H) 01/13/2021 0109   CALCIUM 8.7 (L) 01/13/2021 0109   GFRNONAA 57 (L) 01/13/2021 0109   CrCl cannot be calculated (Patient's most recent lab result is older than the maximum 21 days allowed.).  COAG Lab Results  Component Value Date   INR 1.1 01/11/2021    Radiology No results found.   Assessment/Plan 1. Atherosclerosis of native artery of both lower extremities with intermittent claudication (HCC) Recommend:   The patient has evidence of atherosclerosis of the lower extremities  with claudication.  The patient does not voice lifestyle limiting changes at this point in time.   Noninvasive studies do not suggest clinically significant change.   No invasive studies, angiography or surgery at this time The patient should continue walking and begin a more formal exercise program.  The patient should continue antiplatelet therapy and aggressive treatment of the lipid abnormalities   No changes in the patient's medications at this time - VAS [place] ABI WITH/WO TBI; Future  2. CAD in native artery Continue cardiac and antihypertensive medications as already ordered and reviewed, no changes at this time.  Continue statin as ordered and reviewed, no changes at this time  Nitrates PRN for chest pain   3. Essential hypertension Continue antihypertensive medications as already ordered, these medications have been reviewed and there are no changes at this time.   4. Stage 3 chronic kidney disease, unspecified whether stage 3a or 3b CKD ([place]) At the present time the patient has adequate dialysis access.  Continue hemodialysis  as ordered without interruption.  Avoid nephrotoxic medications and dehydration.  Further plans per nephrology   5. Mixed hyperlipidemia Continue statin as ordered and reviewed, no changes at this time     Hortencia Pilar, MD  02/05/2022 12:16 PM

## 2022-02-08 ENCOUNTER — Other Ambulatory Visit: Payer: Self-pay

## 2022-02-08 ENCOUNTER — Encounter (INDEPENDENT_AMBULATORY_CARE_PROVIDER_SITE_OTHER): Payer: Self-pay | Admitting: Vascular Surgery

## 2022-02-08 ENCOUNTER — Ambulatory Visit (INDEPENDENT_AMBULATORY_CARE_PROVIDER_SITE_OTHER): Payer: Medicare Other | Admitting: Vascular Surgery

## 2022-02-08 ENCOUNTER — Ambulatory Visit (INDEPENDENT_AMBULATORY_CARE_PROVIDER_SITE_OTHER): Payer: Medicare Other

## 2022-02-08 VITALS — BP 137/75 | HR 68 | Ht 71.0 in | Wt 134.0 lb

## 2022-02-08 DIAGNOSIS — I70213 Atherosclerosis of native arteries of extremities with intermittent claudication, bilateral legs: Secondary | ICD-10-CM | POA: Diagnosis not present

## 2022-02-08 DIAGNOSIS — Z461 Encounter for fitting and adjustment of hearing aid: Secondary | ICD-10-CM | POA: Insufficient documentation

## 2022-02-08 DIAGNOSIS — Z48810 Encounter for surgical aftercare following surgery on the sense organs: Secondary | ICD-10-CM | POA: Insufficient documentation

## 2022-02-08 DIAGNOSIS — Z01818 Encounter for other preprocedural examination: Secondary | ICD-10-CM | POA: Insufficient documentation

## 2022-02-08 DIAGNOSIS — I2510 Atherosclerotic heart disease of native coronary artery without angina pectoris: Secondary | ICD-10-CM

## 2022-02-08 DIAGNOSIS — I10 Essential (primary) hypertension: Secondary | ICD-10-CM

## 2022-02-08 DIAGNOSIS — E782 Mixed hyperlipidemia: Secondary | ICD-10-CM

## 2022-02-08 DIAGNOSIS — H269 Unspecified cataract: Secondary | ICD-10-CM | POA: Insufficient documentation

## 2022-02-08 DIAGNOSIS — N183 Chronic kidney disease, stage 3 unspecified: Secondary | ICD-10-CM | POA: Diagnosis not present

## 2022-02-08 DIAGNOSIS — H903 Sensorineural hearing loss, bilateral: Secondary | ICD-10-CM | POA: Insufficient documentation

## 2022-02-08 DIAGNOSIS — H2702 Aphakia, left eye: Secondary | ICD-10-CM | POA: Insufficient documentation

## 2022-02-08 DIAGNOSIS — H04123 Dry eye syndrome of bilateral lacrimal glands: Secondary | ICD-10-CM | POA: Insufficient documentation

## 2022-02-14 ENCOUNTER — Encounter (INDEPENDENT_AMBULATORY_CARE_PROVIDER_SITE_OTHER): Payer: Self-pay | Admitting: Vascular Surgery

## 2022-03-23 ENCOUNTER — Telehealth: Payer: Self-pay

## 2022-03-23 NOTE — Telephone Encounter (Signed)
NOTES SCANNED TO REFERRAL 

## 2022-03-28 IMAGING — US US RENAL
1 series · 14 of 25 positions shown · non-contrast
Comparison: CT abdomen pelvis 09/14/2011

CLINICAL DATA: Proteinuria, stage III CKD

EXAM:
RENAL / URINARY TRACT ULTRASOUND COMPLETE

[Series 1: us renal · 0.20mm/px · 14 of 41 slices shown]
[im 1/41]
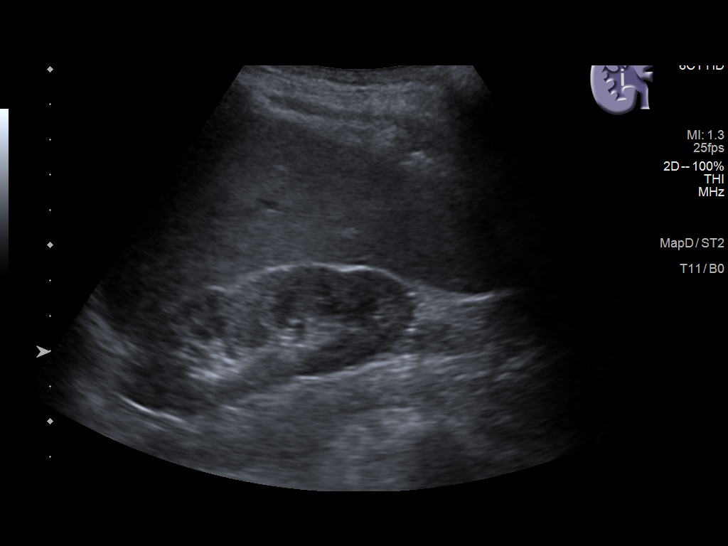
[im 4/41]
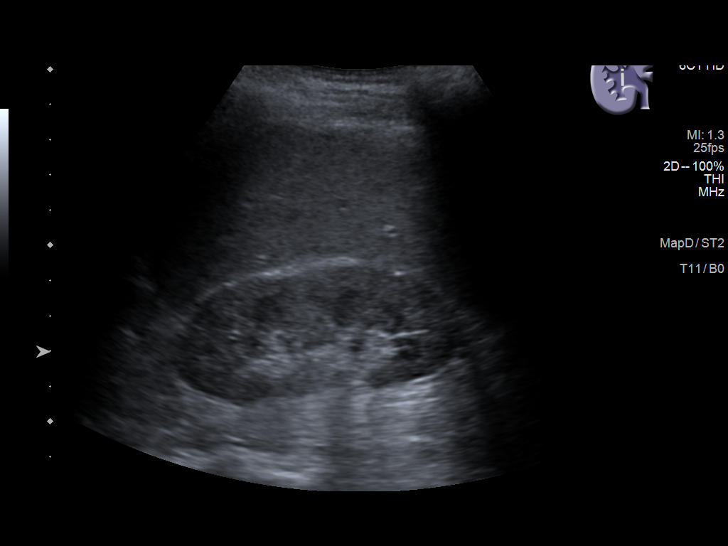
[im 7/41]
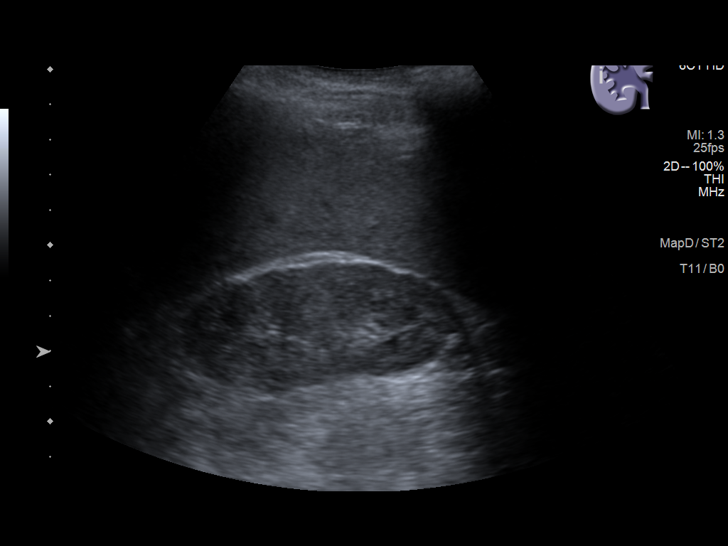
[im 11/41]
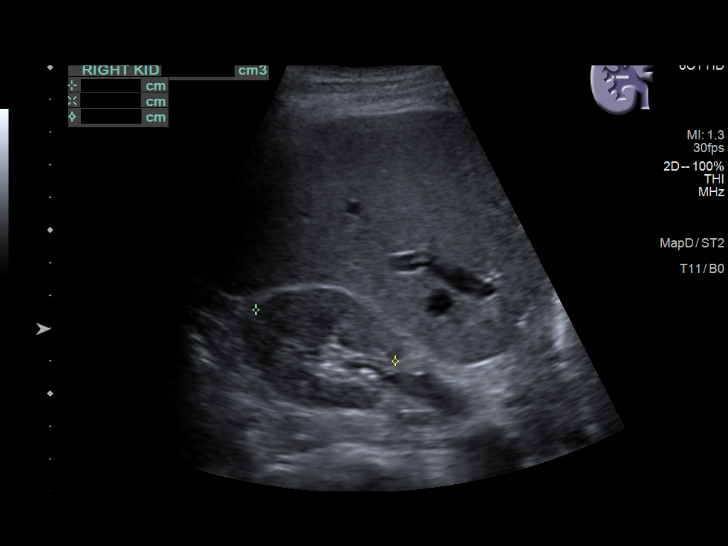
[im 14/41]
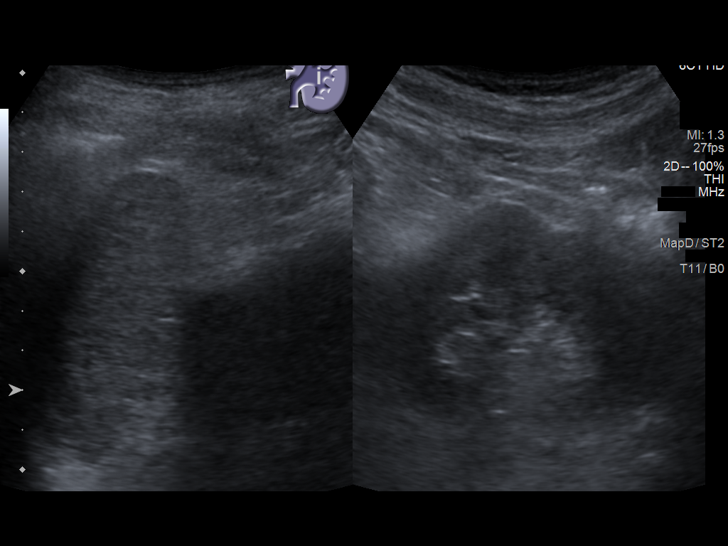
[im 16/41]
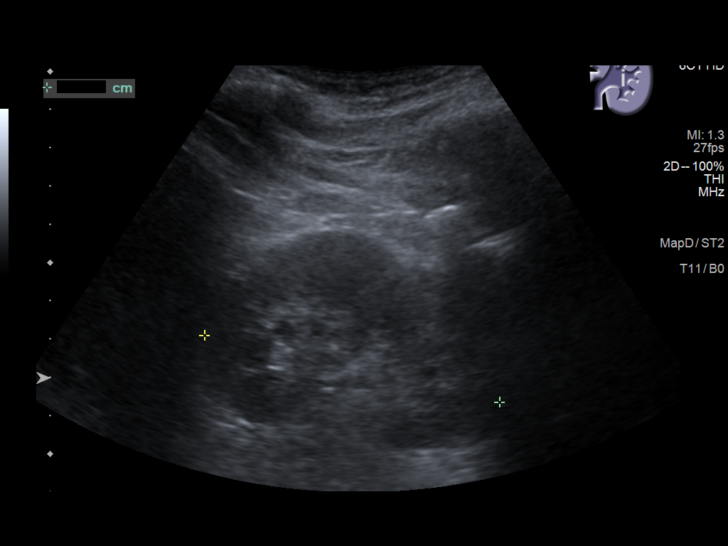
[im 19/41]
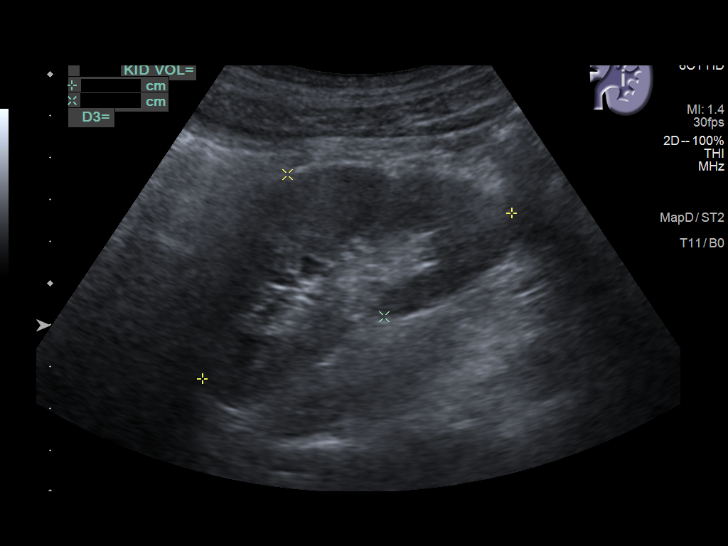
[im 22/41]
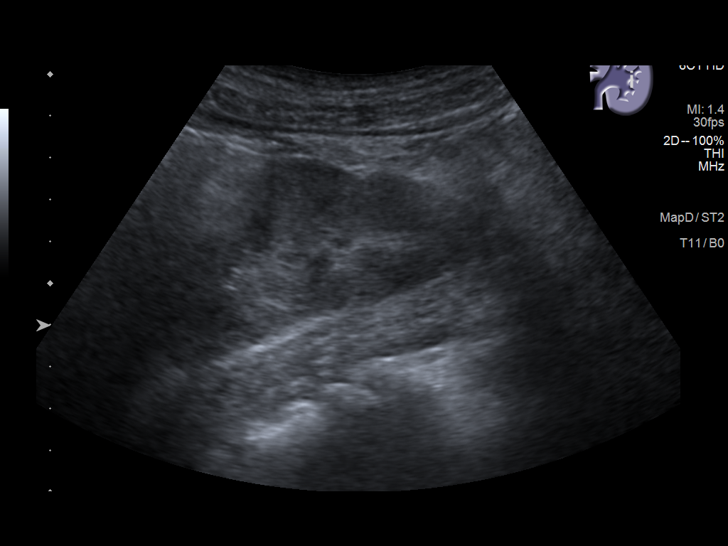
[im 26/41]
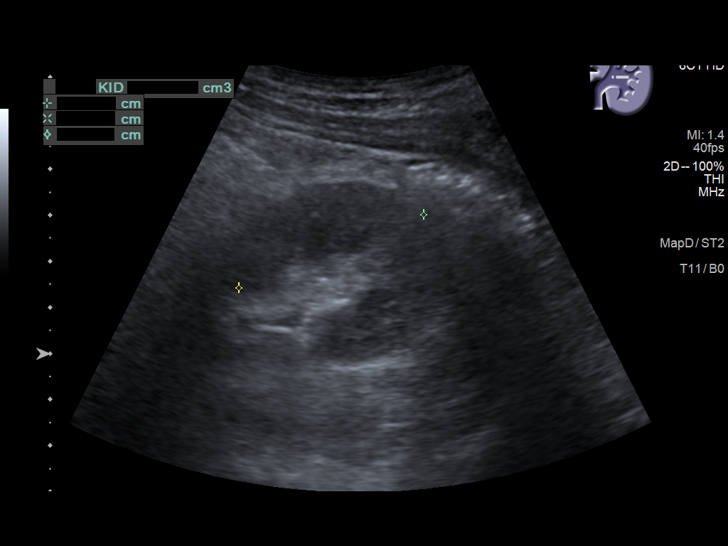
[im 27/41]
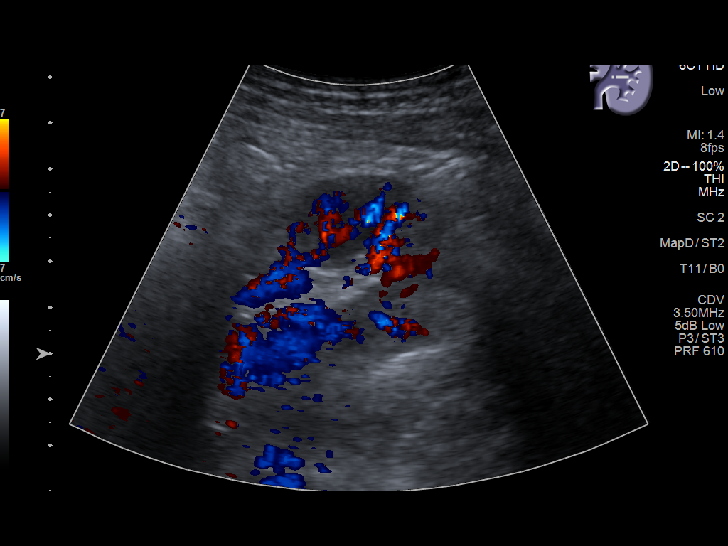
[im 31/41]
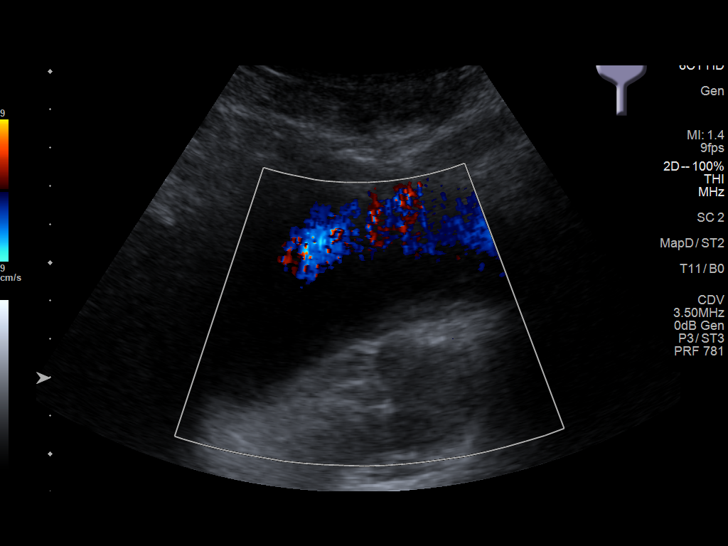
[im 34/41]
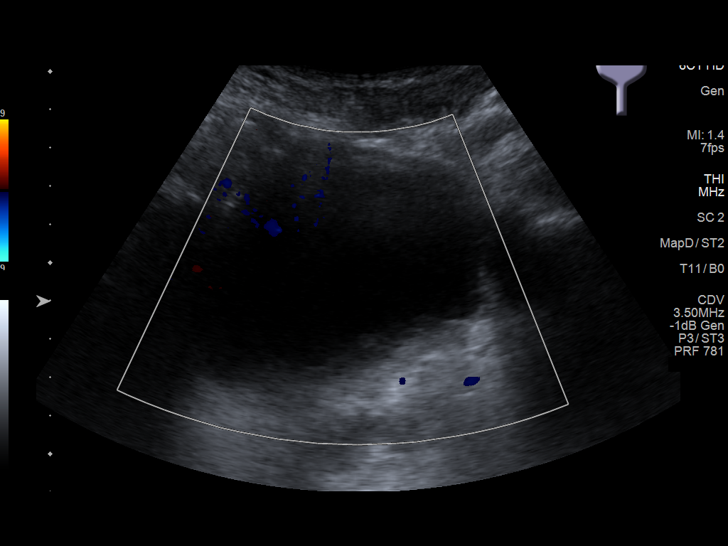
[im 37/41]
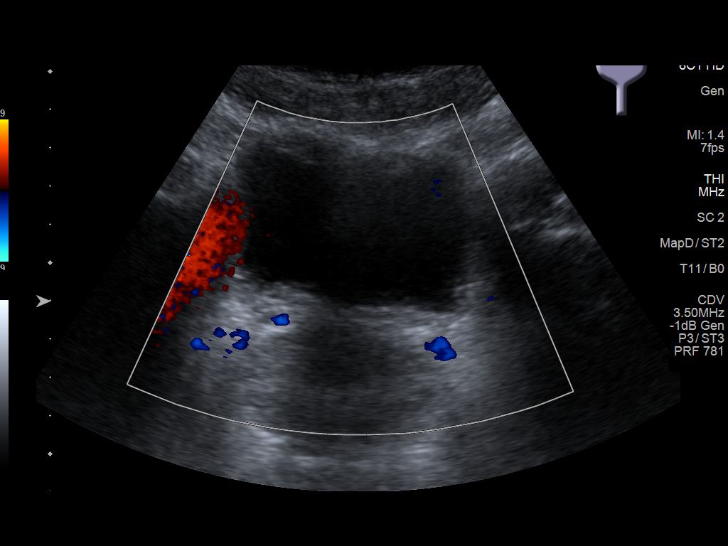
[im 41/41]
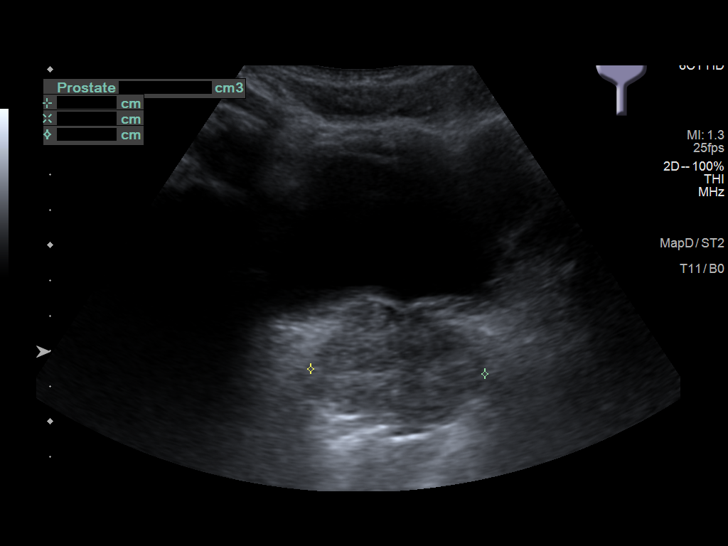

[14 of 25 positions shown; findings below may reference images not displayed]

FINDINGS: Right Kidney:

Renal measurements: 8.0 x 3.5 x 4.5 cm = volume: 67 mL. Echogenicity
within normal limits. No mass or hydronephrosis visualized.

Left Kidney:

Renal measurements: 8.4 x 4.1 x 4.3 cm = volume: 78 mL. Echogenicity
within normal limits. No mass or hydronephrosis visualized.

Bladder:

Appears normal for degree of bladder distention.

Other:

Prostate measures up to 4.9 cm.
IMPRESSION: Unremarkable sonographic appearance of the bilateral kidneys and
bladder.

## 2022-04-09 NOTE — Progress Notes (Signed)
?Cardiology Office Note:   ? ?Date:  04/09/2022  ? ?ID:  Carl Townsend, DOB 1946-11-04, MRN [id number] ? ?PCP:  [name], MD  ?Cardiologist:  None  ? ?Referring MD: Yolonda Kida, MD  ? ?No chief complaint on file. ? ? ?History of Present Illness:   ? ?Carl Townsend is a 76 y.o. male with a hx of non-ST elevation MI January 2021 leading to ostial circumflex stent implantation, CKD stage III, primary hypertension, hyperlipidemia, prediabetes, who is referred back for consideration of repeat coronary angiography. ? ? ?He had chest discomfort immediately following the stent procedure in January 2022 that was relieved by the addition of 30 mg of isosorbide mononitrate daily.  Over the past 2 months he has started experiencing discomfort again.  It occurs randomly.  Occasionally occurs with activity.  Does not occur every day.  Nowhere near as severe as what he had prior to stent implantation in the ostial circumflex.  Occasionally he will take a nitroglycerin and it seems to help the discomfort to resolve within minutes. ? ?Last episode of discomfort was 2 days ago.  He had no difficulty walking into the office today. ? ? ? ?Past Medical History:  ?Diagnosis Date  ? Arthritis   ? neck and shoulders  ? Chronic kidney disease   ? chronic kidney disease - stage 3, GFR 30-59 ml/min  ? Colon cancer ([place])   ? ED (erectile dysfunction)   ? History of 2019 novel coronavirus disease (COVID-19) 08/23/2020  ? Hyperlipidemia   ? Hypertension   ? Inguinal hernia   ? Pre-diabetes   ? ? ?Past Surgical History:  ?Procedure Laterality Date  ? COLON SURGERY Right 05/06  ? colectomy and sigmoid colectomy   ? COLONOSCOPY WITH PROPOFOL N/A 03/13/2016  ? Procedure: COLONOSCOPY WITH PROPOFOL;  Surgeon: Lollie Sails, MD;  Location: [facility] ENDOSCOPY;  Service: Endoscopy;  Laterality: N/A;  ? COLONOSCOPY WITH PROPOFOL N/A 03/09/2020  ? Procedure: COLONOSCOPY WITH PROPOFOL;  Surgeon: Toledo, Benay Pike, MD;  Location:  [facility] ENDOSCOPY;  Service: Gastroenterology;  Laterality: N/A;  ? CORONARY STENT INTERVENTION N/A 01/12/2021  ? Procedure: CORONARY STENT INTERVENTION;  Surgeon: Belva Crome, MD;  Location: [place] CV LAB;  Service: Cardiovascular;  Laterality: N/A;  ? EYE SURGERY Left   ? vitreous retinal surgery  ? IABP INSERTION N/A 01/12/2021  ? Procedure: IABP Insertion;  Surgeon: Yolonda Kida, MD;  Location: [place] CV LAB;  Service: Cardiovascular;  Laterality: N/A;  ? INTRAVASCULAR ULTRASOUND/IVUS N/A 01/12/2021  ? Procedure: Intravascular Ultrasound/IVUS;  Surgeon: Belva Crome, MD;  Location: [place] CV LAB;  Service: Cardiovascular;  Laterality: N/A;  ? LEFT HEART CATH AND CORONARY ANGIOGRAPHY N/A 01/12/2021  ? Procedure: LEFT HEART CATH AND CORONARY ANGIOGRAPHY possible PCI and stent;  Surgeon: Yolonda Kida, MD;  Location: [place] CV LAB;  Service: Cardiovascular;  Laterality: N/A;  ? right foot surgery    ? XI ROBOTIC ASSISTED INGUINAL HERNIA REPAIR WITH MESH Bilateral 11/10/2020  ? Procedure: XI ROBOTIC ASSISTED INGUINAL HERNIA REPAIR WITH MESH;  Surgeon: Benjamine Sprague, DO;  Location: [facility] ORS;  Service: General;  Laterality: Bilateral;  ? ? ?Current Medications: ?No outpatient medications have been marked as taking for the 04/11/22 encounter (Appointment) with Belva Crome, MD.  ?  ? ?Allergies:   Patient has no known allergies.  ? ?Social History  ? ?Socioeconomic History  ? Marital status: Married  ?  Spouse name: Not on file  ?  Number of children: Not on file  ? Years of education: Not on file  ? Highest education level: Not on file  ?Occupational History  ? Not on file  ?Tobacco Use  ? Smoking status: Former  ?  Types: Cigarettes  ?  Quit date: 2000  ?  Years since quitting: 23.2  ? Smokeless tobacco: Never  ?Vaping Use  ? Vaping Use: Never used  ?Substance and Sexual Activity  ? Alcohol use: No  ?  Comment: glass of wine "once in a while"   ? Drug use: No  ? Sexual activity: Not on  file  ?Other Topics Concern  ? Not on file  ?Social History Narrative  ? Not on file  ? ?Social Determinants of Health  ? ?Financial Resource Strain: Not on file  ?Food Insecurity: Not on file  ?Transportation Needs: Not on file  ?Physical Activity: Not on file  ?Stress: Not on file  ?Social Connections: Not on file  ?  ? ?Family History: ?The patient's family history includes Heart attack in his brother and father; Heart disease in his brother; Heart failure in his mother. ? ?ROS:   ?Please see the history of present illness.    ?He has had some orthostatic lightheaded episodes.  His wife reported that.  All other systems reviewed and are negative. ? ?EKGs/Labs/Other Studies Reviewed:   ? ?The following studies were reviewed today: ?Gated stress myocardial perfusion imaging, 03/06/2022: ? ?Gated post-stress perfusion imaging was performed 30 minutes after stress.  ?Rest images were performed 30 minutes after injection.  ? ?Gated LV Analysis:  ? ?Summary of LV Perfusion: Equivocal  ? ?Summary of LV Function: Abnormal  ? ?TID:  1.22  ? ?LVEF= 44%  ? ?FINDINGS:  ?Regional wall motion:  demonstrates  hypokinesis of the lateral wall.  ?The overall quality of the study is good.    ?Artifacts noted: no  ?Left ventricular cavity: normal.  ? ?Perfusion Analysis:  SPECT images demonstrate moderate perfusion  ?abnormality of moderate intensity is present in the lateral inferior  ?region region on the stress images. Defect type:  Mixed ? ? ?2D Doppler echocardiogram January 2022: ?IMPRESSIONS  ? ? ? 1. Inferior Hypokinesis.  ? 2. Left ventricular ejection fraction, by estimation, is 50 to 55%. The  ?left ventricle has low normal function. The left ventricle demonstrates  ?regional wall motion abnormalities (see scoring diagram/findings for  ?description). Left ventricular diastolic  ? parameters were normal.  ? 3. Right ventricular systolic function is normal. The right ventricular  ?size is normal.  ? 4. The mitral valve is  grossly normal. No evidence of mitral valve  ?regurgitation.  ? 5. The aortic valve is grossly normal. Aortic valve regurgitation is not  ?visualized.  ? ?Coronary angiography with PCI circumflex ostial lesion 01/12/2021: ?Diagnostic ?Dominance: Right ?Intervention ? ? ? ? ?EKG:  EKG normal sinus rhythm, prominent voltage consistent with LVH performed/12/23. ? ?Recent Labs: ?No results found for requested labs within last 8760 hours.  ?Recent Lipid Panel ?   ?Component Value Date/Time  ? CHOL 147 01/12/2021 0252  ? TRIG 77 01/12/2021 0252  ? HDL 45 01/12/2021 0252  ? CHOLHDL 3.3 01/12/2021 0252  ? VLDL 15 01/12/2021 0252  ? [place] 87 01/12/2021 0252  ? ? ?Physical Exam:   ? ?VS:  There were no vitals taken for this visit.   ? ?Wt Readings from Last 3 Encounters:  ?02/08/22 134 lb (60.8 kg)  ?02/13/21 136 lb (61.7 kg)  ?02/09/21  138 lb (62.6 kg)  ?  ? ?GEN: Slender. No acute distress ?HEENT: Normal ?NECK: No JVD. ?LYMPHATICS: No lymphadenopathy ?CARDIAC: No murmur. RRR no gallop, or edema. ?VASCULAR:  Normal Pulses. No bruits. ?RESPIRATORY:  Clear to auscultation without rales, wheezing or rhonchi  ?ABDOMEN: Soft, non-tender, non-distended, No pulsatile mass, ?MUSCULOSKELETAL: No deformity  ?SKIN: Warm and dry ?NEUROLOGIC:  Alert and oriented x 3 ?PSYCHIATRIC:  Normal affect  ? ?ASSESSMENT:   ? ?1. Chest pain of uncertain etiology   ?2. NSTEMI (non-ST elevated myocardial infarction) ([place])   ?3. CAD in native artery   ?4. Atherosclerosis of native artery of both lower extremities with intermittent claudication ([place])   ?5. Mixed hyperlipidemia   ?6. Essential hypertension   ? ?PLAN:   ? ?In order of problems listed above: ? ?Being referred back for consideration of repeat catheterization.  Not really having much angina.  It occurs randomly if it is angina.  He occasionally takes nitroglycerin for it.  The same complaints seem to improve with 30 mg of Imdur being added in January post stent.  Noted the stress test  results.  This does suggest that there may be ischemia in the lateral wall territory or that we have seen no residual infarct pattern from the January 2022 event.  Recommend increasing isosorbide to 60 mg/day.

## 2022-04-11 ENCOUNTER — Encounter: Payer: Self-pay | Admitting: Interventional Cardiology

## 2022-04-11 ENCOUNTER — Ambulatory Visit (INDEPENDENT_AMBULATORY_CARE_PROVIDER_SITE_OTHER): Payer: Medicare Other | Admitting: Interventional Cardiology

## 2022-04-11 VITALS — BP 152/90 | HR 84 | Ht 71.0 in | Wt 135.6 lb

## 2022-04-11 DIAGNOSIS — I214 Non-ST elevation (NSTEMI) myocardial infarction: Secondary | ICD-10-CM | POA: Diagnosis not present

## 2022-04-11 DIAGNOSIS — I10 Essential (primary) hypertension: Secondary | ICD-10-CM

## 2022-04-11 DIAGNOSIS — E782 Mixed hyperlipidemia: Secondary | ICD-10-CM | POA: Diagnosis not present

## 2022-04-11 DIAGNOSIS — I70213 Atherosclerosis of native arteries of extremities with intermittent claudication, bilateral legs: Secondary | ICD-10-CM | POA: Diagnosis not present

## 2022-04-11 DIAGNOSIS — R079 Chest pain, unspecified: Secondary | ICD-10-CM

## 2022-04-11 DIAGNOSIS — I2510 Atherosclerotic heart disease of native coronary artery without angina pectoris: Secondary | ICD-10-CM | POA: Diagnosis not present

## 2022-04-11 MED ORDER — ISOSORBIDE MONONITRATE ER 60 MG PO TB24: 60.0000 mg | ORAL_TABLET | Freq: Every day | ORAL | 3 refills | Status: DC

## 2022-04-11 NOTE — Patient Instructions (Signed)
Medication Instructions:  ?1) INCREASE Imdur to '60mg'$  once daily ? ?*If you need a refill on your cardiac medications before your next appointment, please call your pharmacy* ? ? ?Lab Work: ?None ?If you have labs (blood work) drawn today and your tests are completely normal, you will receive your results only by: ?MyChart Message (if you have MyChart) OR ?A paper copy in the mail ?If you have any lab test that is abnormal or we need to change your treatment, we will call you to review the results. ? ? ?Testing/Procedures: ?None ? ? ?Follow-Up: ?At [facility], you and your health needs are our priority.  As part of our continuing mission to provide you with exceptional heart care, we have created designated Provider Care Teams.  These Care Teams include your primary Cardiologist (physician) and Advanced Practice Providers (APPs -  Physician Assistants and Nurse Practitioners) who all work together to provide you with the care you need, when you need it. ? ?We recommend signing up for the patient portal called "MyChart".  Sign up information is provided on this After Visit Summary.  MyChart is used to connect with patients for Virtual Visits (Telemedicine).  Patients are able to view lab/test results, encounter notes, upcoming appointments, etc.  Non-urgent messages can be sent to your provider as well.   ?To learn more about what you can do with MyChart, go to [email or web address].   ? ?Your next appointment:   ?1 month(s) ? ?The format for your next appointment:   ?In Person ? ?Provider:   ?[facility]. Blenda Bridegroom, MD  ? ? ?Other Instructions ? ?Important Information About Sugar ? ? ? ? ?  ?

## 2022-04-25 ENCOUNTER — Other Ambulatory Visit: Payer: Self-pay | Admitting: Internal Medicine

## 2022-05-14 NOTE — Progress Notes (Signed)
?Cardiology Office Note:   ? ?Date:  05/15/2022  ? ?ID:  Carl Townsend, DOB [birth year], MRN [id number] ? ?PCP:  [name], MD  ?Cardiologist:  Sinclair Grooms, MD  ? ?Referring MD: [name], MD  ? ?Chief Complaint  ?Patient presents with  ? Coronary Artery Disease  ? ? ?History of Present Illness:   ? ?Carl Townsend is a 76 y.o. male with a hx of non-ST elevation MI January 2021 leading to ostial circumflex stent implantation, CKD stage III, primary hypertension, hyperlipidemia, prediabetes, who is referred back for consideration of repeat coronary angiography. ? ?Returns for f/u of after up-titration of medical therapy. ? ?Is doing better.  Minimal if any nitroglycerin use.  Back to physical activity.  Not being limited.  He has occasional discomfort at rest that nitroglycerin seems to improve.  He has noted a definite improvement in overall symptoms after up titration of isosorbide.  He will have additional room if needed. ? ?Past Medical History:  ?Diagnosis Date  ? Arthritis   ? neck and shoulders  ? Chronic kidney disease   ? chronic kidney disease - stage 3, GFR 30-59 ml/min  ? Colon cancer ([place])   ? ED (erectile dysfunction)   ? History of 2019 novel coronavirus disease (COVID-19) 08/23/2020  ? Hyperlipidemia   ? Hypertension   ? Inguinal hernia   ? NSTEMI (non-ST elevated myocardial infarction) ([place])   ? Pre-diabetes   ? Tubular adenoma of colon   ? ? ?Past Surgical History:  ?Procedure Laterality Date  ? COLON SURGERY Right 05/06  ? colectomy and sigmoid colectomy   ? COLONOSCOPY WITH PROPOFOL N/A 03/13/2016  ? Procedure: COLONOSCOPY WITH PROPOFOL;  Surgeon: Lollie Sails, MD;  [facility] ENDOSCOPY;  Service: Endoscopy;  Laterality: N/A;  ? COLONOSCOPY WITH PROPOFOL N/A 03/09/2020  ? Procedure: COLONOSCOPY WITH PROPOFOL;  Surgeon: Toledo, Benay Pike, MD;  Location: [facility] ENDOSCOPY;  Service: Gastroenterology;  Laterality: N/A;  ? CORONARY STENT INTERVENTION N/A 01/12/2021   ? Procedure: CORONARY STENT INTERVENTION;  Surgeon: Belva Crome, MD;  Location: [place] [facility] LAB;  Service: Cardiovascular;  Laterality: N/A;  ? EYE SURGERY Left   ? vitreous retinal surgery  ? IABP INSERTION N/A 01/12/2021  ? Procedure: IABP Insertion;  Surgeon: Yolonda Kida, MD;  Location: [place] [facility];  Service: Cardiovascular;  Laterality: N/A;  ? INTRAVASCULAR ULTRASOUND/IVUS N/A 01/12/2021  ? Procedure: Intravascular Ultrasound/IVUS;  Surgeon: Belva Crome, MD;  Location: [place] [facility];  Service: Cardiovascular;  Laterality: N/A;  ? LEFT HEART CATH AND CORONARY ANGIOGRAPHY N/A 01/12/2021  ? Procedure: LEFT HEART CATH AND CORONARY ANGIOGRAPHY possible PCI and stent;  Surgeon: Yolonda Kida, MD;  Location: [place] [facility];  Service: Cardiovascular;  Laterality: N/A;  ? right foot surgery    ? XI ROBOTIC ASSISTED INGUINAL HERNIA REPAIR WITH MESH Bilateral 11/10/2020  ? Procedure: XI ROBOTIC ASSISTED INGUINAL HERNIA REPAIR WITH MESH;  Surgeon: Benjamine Sprague, DO;  Location: [facility] ORS;  Service: General;  Laterality: Bilateral;  ? ? ?Current Medications: ?Current Meds  ?Medication Sig  ? albuterol (VENTOLIN HFA) 108 (90 Base) MCG/ACT inhaler Inhale 2 puffs into the lungs every 4 (four) hours as needed for wheezing or shortness of breath.  ? ascorbic acid (VITAMIN C) 500 MG tablet Take 500 mg by mouth daily.  ? aspirin 81 MG tablet Take 81 mg by mouth daily.  ? atenolol (TENORMIN) 50 MG tablet Take 50 mg by  mouth at bedtime.  ? atorvastatin (LIPITOR) 80 MG tablet TAKE 1 TABLET BY MOUTH EVERYDAY AT BEDTIME  ? candesartan (ATACAND) 16 MG tablet Take 16 mg by mouth daily.  ? Carboxymethylcellulose Sodium (REFRESH LIQUIGEL) 1 % GEL Place 1 drop into both eyes at bedtime.   ? clopidogrel (PLAVIX) 75 MG tablet Take 75 mg by mouth daily.  ? cyanocobalamin 1000 MCG tablet Take 1,000 mcg by mouth daily.  ? ergocalciferol (VITAMIN D2) 1.25 MG (50000 UT) capsule Take 50,000 Units by mouth once a  week.  ? Glycerin-Polysorbate 80 (REFRESH DRY EYE THERAPY OP) Place 1 drop into both eyes daily as needed (dry eye).  ? isosorbide mononitrate (IMDUR) 60 MG 24 hr tablet Take 1 tablet (60 mg total) by mouth daily.  ? Multiple Vitamin (MULTIVITAMIN) tablet Take 1 tablet by mouth daily.  ? nitroGLYCERIN (NITROSTAT) 0.4 MG SL tablet Place 1 tablet (0.4 mg total) under the tongue every 5 (five) minutes as needed.  ? pantoprazole (PROTONIX) 40 MG tablet Take 40 mg by mouth daily.  ? zinc gluconate 50 MG tablet Take 50 mg by mouth daily.  ?  ? ?Allergies:   Patient has no known allergies.  ? ?Social History  ? ?Socioeconomic History  ? Marital status: Married  ?  Spouse name: Not on file  ? Number of children: Not on file  ? Years of education: Not on file  ? Highest education level: Not on file  ?Occupational History  ? Not on file  ?Tobacco Use  ? Smoking status: Former  ?  Types: Cigarettes  ?  Quit date: 2000  ?  Years since quitting: 23.3  ? Smokeless tobacco: Never  ?Vaping Use  ? Vaping Use: Never used  ?Substance and Sexual Activity  ? Alcohol use: No  ?  Comment: glass of wine "once in a while"   ? Drug use: No  ? Sexual activity: Not on file  ?Other Topics Concern  ? Not on file  ?Social History Narrative  ? Not on file  ? ?Social Determinants of Health  ? ?Financial Resource Strain: Not on file  ?Food Insecurity: Not on file  ?Transportation Needs: Not on file  ?Physical Activity: Not on file  ?Stress: Not on file  ?Social Connections: Not on file  ?  ? ?Family History: ?The patient's family history includes Heart attack in his brother and father; Heart disease in his brother; Heart failure in his mother. ? ?ROS:   ?Please see the history of present illness.    ?Overall feels well.  No complaints.  All other systems reviewed and are negative. ? ?EKGs/Labs/Other Studies Reviewed:   ? ?The following studies were reviewed today: ?ABI 02/15/2022: ?Summary:  ?Right: Resting right ankle-brachial index indicates  noncompressible right  ?lower extremity arteries.  ?Doppler waveforms and normal TBI suggest normal perfusion to the lower  ?extremity.  ? ?Left: The left toe-brachial index is abnormal.  ?Although ankle brachial indices are within normal limits (0.95-1.29),  ?arterial Doppler waveforms at the ankle suggest some component of arterial  ?occlusive disease.  ?Doppler waveforms suggest significant tibial level occlusive disease.  ?   ?*See table(s) above for measurements and observations.  ?   ? ?Electronically signed by Hortencia Pilar MD on 02/15/2022 at 3:55:48 PM.  ? ? ?CARDIAC PCI 01/02/2021: ?Diagnostic ?Dominance: Right ?Intervention ? ? ? ? ?EKG:  EKG no EKG is performed ? ?Recent Labs: ?No results found for requested labs within last 8760 hours.  ?Recent Lipid  Panel ?   ?Component Value Date/Time  ? CHOL 147 01/12/2021 0252  ? TRIG 77 01/12/2021 0252  ? HDL 45 01/12/2021 0252  ? CHOLHDL 3.3 01/12/2021 0252  ? VLDL 15 01/12/2021 0252  ? [place] 87 01/12/2021 0252  ? ? ?Physical Exam:   ? ?VS:  BP (!) 150/88   Pulse 85   Ht '5\' 11"'$  (1.803 m)   Wt 130 lb 12.8 oz (59.3 kg)   SpO2 100%   BMI 18.24 kg/m?    ? ?Wt Readings from Last 3 Encounters:  ?05/15/22 130 lb 12.8 oz (59.3 kg)  ?04/11/22 135 lb 9.6 oz (61.5 kg)  ?02/08/22 134 lb (60.8 kg)  ?  ? ?GEN: Healthy appearing. No acute distress ?HEENT: Normal ?NECK: No JVD. ?LYMPHATICS: No lymphadenopathy ?CARDIAC: No murmur. RRR S4 but no S3 gallop, or edema. ?VASCULAR: No normal Pulses. No bruits. ?RESPIRATORY:  Clear to auscultation without rales, wheezing or rhonchi  ?ABDOMEN: Soft, non-tender, non-distended, No pulsatile mass, ?MUSCULOSKELETAL: No deformity  ?SKIN: Warm and dry ?NEUROLOGIC:  Alert and oriented x 3 ?PSYCHIATRIC:  Normal affect  ? ?ASSESSMENT:   ? ?1. NSTEMI (non-ST elevated myocardial infarction) ([place])   ?2. CAD in native artery   ?3. Mixed hyperlipidemia   ?4. Atherosclerosis of native artery of both lower extremities with intermittent claudication  ([place])   ?5. Essential hypertension   ? ?PLAN:   ? ?In order of problems listed above: ? ?No prolonged discomfort ?Prevention reviewed.  150 minutes of moderate activity per week encouraged. ?Target LDL should no

## 2022-05-15 ENCOUNTER — Encounter: Payer: Self-pay | Admitting: Interventional Cardiology

## 2022-05-15 ENCOUNTER — Ambulatory Visit (INDEPENDENT_AMBULATORY_CARE_PROVIDER_SITE_OTHER): Payer: Medicare Other | Admitting: Interventional Cardiology

## 2022-05-15 VITALS — BP 150/88 | HR 85 | Ht 71.0 in | Wt 130.8 lb

## 2022-05-15 DIAGNOSIS — I10 Essential (primary) hypertension: Secondary | ICD-10-CM

## 2022-05-15 DIAGNOSIS — E782 Mixed hyperlipidemia: Secondary | ICD-10-CM

## 2022-05-15 DIAGNOSIS — I214 Non-ST elevation (NSTEMI) myocardial infarction: Secondary | ICD-10-CM

## 2022-05-15 DIAGNOSIS — I2510 Atherosclerotic heart disease of native coronary artery without angina pectoris: Secondary | ICD-10-CM | POA: Diagnosis not present

## 2022-05-15 DIAGNOSIS — I70213 Atherosclerosis of native arteries of extremities with intermittent claudication, bilateral legs: Secondary | ICD-10-CM | POA: Diagnosis not present

## 2022-05-15 NOTE — Patient Instructions (Signed)
Medication Instructions:  ?Your physician recommends that you continue on your current medications as directed. Please refer to the Current Medication list given to you today.  ?*If you need a refill on your cardiac medications before your next appointment, please call your pharmacy* ? ?Lab Work: ?NONE ? ?Testing/Procedures: ?NONE ? ?Follow-Up: ?At [facility], you and your health needs are our priority.  As part of our continuing mission to provide you with exceptional heart care, we have created designated Provider Care Teams.  These Care Teams include your primary Cardiologist (physician) and Advanced Practice Providers (APPs -  Physician Assistants and Nurse Practitioners) who all work together to provide you with the care you need, when you need it. ? ?Your next appointment:   ?6-7 month(s) ? ?The format for your next appointment:   ?In Person ? ?Provider:   ?Sinclair Grooms, MD { ? ? ?Important Information About Sugar ? ? ? ? ?  ?

## 2022-10-29 ENCOUNTER — Encounter (INDEPENDENT_AMBULATORY_CARE_PROVIDER_SITE_OTHER): Payer: Self-pay

## 2022-11-01 ENCOUNTER — Ambulatory Visit (LOCAL_COMMUNITY_HEALTH_CENTER): Payer: Medicare Other

## 2022-11-01 DIAGNOSIS — Z719 Counseling, unspecified: Secondary | ICD-10-CM

## 2022-11-01 DIAGNOSIS — Z23 Encounter for immunization: Secondary | ICD-10-CM | POA: Diagnosis not present

## 2022-11-01 NOTE — Progress Notes (Signed)
  Are you feeling sick today? No   Have you ever received a dose of COVID-19 Vaccine? [employer], [place], [place], [place], Other) Yes  If yes, which vaccine and how many doses?    Pfizer 6 doses  Did you bring the vaccination record card or other documentation?  No   Do you have a health condition or are undergoing treatment that makes you moderately or severely immunocompromised? This would include, but not be limited to: cancer, HIV, organ transplant, immunosuppressive therapy/high-dose corticosteroids, or moderate/severe primary immunodeficiency.  No  Have you received COVID-19 vaccine before or during hematopoietic cell transplant (HCT) or CAR-T-cell therapies? No  Have you ever had an allergic reaction to: (This would include a severe allergic reaction or a reaction that caused hives, swelling, or respiratory distress, including wheezing.) A component of a COVID-19 vaccine or a previous dose of COVID-19 vaccine? No   Have you ever had an allergic reaction to another vaccine (other thanCOVID-19 vaccine) or an injectable medication? (This would include a severe allergic reaction or a reaction that caused hives, swelling, or respiratory distress, including wheezing.)   No    Do you have a history of any of the following:  Myocarditis or Pericarditis No  Dermal fillers:  No  Multisystem Inflammatory Syndrome (MIS-C or MIS-A)? No  COVID-19 disease within the past 3 months? No  Vaccinated with monkeypox vaccine in the last 4 weeks? No     In nurse clinic for Covid vaccine. Tolerated well L.delt. [facility] updated and copy given to pt.

## 2022-11-03 ENCOUNTER — Other Ambulatory Visit (HOSPITAL_COMMUNITY): Payer: Medicare Other

## 2022-11-03 ENCOUNTER — Encounter (HOSPITAL_COMMUNITY): Payer: Self-pay | Admitting: *Deleted

## 2022-11-03 ENCOUNTER — Emergency Department (HOSPITAL_COMMUNITY)
Admission: EM | Admit: 2022-11-03 | Discharge: 2022-11-04 | Disposition: A | Discharge status: Home / Self Care | Payer: Medicare Other | Attending: Emergency Medicine | Admitting: Emergency Medicine

## 2022-11-03 ENCOUNTER — Other Ambulatory Visit: Payer: Self-pay

## 2022-11-03 ENCOUNTER — Emergency Department (HOSPITAL_COMMUNITY): Payer: Medicare Other

## 2022-11-03 DIAGNOSIS — W01198A Fall on same level from slipping, tripping and stumbling with subsequent striking against other object, initial encounter: Secondary | ICD-10-CM | POA: Diagnosis not present

## 2022-11-03 DIAGNOSIS — R079 Chest pain, unspecified: Secondary | ICD-10-CM | POA: Insufficient documentation

## 2022-11-03 DIAGNOSIS — S01512A Laceration without foreign body of oral cavity, initial encounter: Secondary | ICD-10-CM | POA: Insufficient documentation

## 2022-11-03 DIAGNOSIS — S0990XA Unspecified injury of head, initial encounter: Secondary | ICD-10-CM | POA: Diagnosis not present

## 2022-11-03 DIAGNOSIS — E871 Hypo-osmolality and hyponatremia: Secondary | ICD-10-CM | POA: Diagnosis not present

## 2022-11-03 DIAGNOSIS — Z7982 Long term (current) use of aspirin: Secondary | ICD-10-CM | POA: Diagnosis not present

## 2022-11-03 DIAGNOSIS — S01511A Laceration without foreign body of lip, initial encounter: Secondary | ICD-10-CM | POA: Insufficient documentation

## 2022-11-03 DIAGNOSIS — W19XXXA Unspecified fall, initial encounter: Secondary | ICD-10-CM

## 2022-11-03 DIAGNOSIS — S0993XA Unspecified injury of face, initial encounter: Secondary | ICD-10-CM | POA: Diagnosis present

## 2022-11-03 LAB — CBC WITH DIFFERENTIAL/PLATELET
Abs Immature Granulocytes: 0.01 10*3/uL (ref 0.00–0.07)
Basophils Absolute: 0 10*3/uL (ref 0.0–0.1)
Basophils Relative: 0 %
Eosinophils Absolute: 0.3 10*3/uL (ref 0.0–0.5)
Eosinophils Relative: 7 %
HCT: 36.5 % — ABNORMAL LOW (ref 39.0–52.0)
Hemoglobin: 12.2 g/dL — ABNORMAL LOW (ref 13.0–17.0)
Immature Granulocytes: 0 %
Lymphocytes Relative: 22 %
Lymphs Abs: 1 10*3/uL (ref 0.7–4.0)
MCH: 34.5 pg — ABNORMAL HIGH (ref 26.0–34.0)
MCHC: 33.4 g/dL (ref 30.0–36.0)
MCV: 103.1 fL — ABNORMAL HIGH (ref 80.0–100.0)
Monocytes Absolute: 0.5 10*3/uL (ref 0.1–1.0)
Monocytes Relative: 11 %
Neutro Abs: 2.9 10*3/uL (ref 1.7–7.7)
Neutrophils Relative %: 60 %
Platelets: 340 10*3/uL (ref 150–400)
RBC: 3.54 MIL/uL — ABNORMAL LOW (ref 4.22–5.81)
RDW: 12.4 % (ref 11.5–15.5)
WBC: 4.7 10*3/uL (ref 4.0–10.5)
nRBC: 0 % (ref 0.0–0.2)

## 2022-11-03 LAB — BASIC METABOLIC PANEL
Anion gap: 12 (ref 5–15)
BUN: 15 mg/dL (ref 8–23)
CO2: 20 mmol/L — ABNORMAL LOW (ref 22–32)
Calcium: 8.6 mg/dL — ABNORMAL LOW (ref 8.9–10.3)
Chloride: 97 mmol/L — ABNORMAL LOW (ref 98–111)
Creatinine, Ser: 1.6 mg/dL — ABNORMAL HIGH (ref 0.61–1.24)
GFR, Estimated: 44 mL/min — ABNORMAL LOW (ref >60)
Glucose, Bld: 97 mg/dL (ref 70–99)
Potassium: 5.1 mmol/L (ref 3.5–5.1)
Sodium: 129 mmol/L — ABNORMAL LOW (ref 135–145)

## 2022-11-03 LAB — TROPONIN I (HIGH SENSITIVITY): Troponin I (High Sensitivity): 6 ng/L (ref <18)

## 2022-11-03 MED ORDER — SODIUM CHLORIDE 0.9 % IV BOLUS
500.0000 mL | Freq: Once | INTRAVENOUS | Status: AC
  Administered 2022-11-03: 500 mL via INTRAVENOUS

## 2022-11-03 MED ORDER — LIDOCAINE HCL (PF) 1 % IJ SOLN
5.0000 mL | Freq: Once | INTRAMUSCULAR | Status: DC
  Filled 2022-11-03: qty 5

## 2022-11-03 MED ORDER — OXYCODONE-ACETAMINOPHEN 5-325 MG PO TABS
1.0000 | ORAL_TABLET | Freq: Once | ORAL | Status: AC
  Administered 2022-11-03: 1 via ORAL
  Filled 2022-11-03: qty 1

## 2022-11-03 NOTE — ED Triage Notes (Signed)
Pt arrived via EMS post slip and fall after being at football game.  Pt states he remembers falling and then he saw the blood.  Pt bit his tongue and has gauze on it and bleeding controlled.  Pt reports drinking wine tonight. Pt denies being on blood thinners. Pt has c-collar in place, reports taking nitro earlier for chest pain (history of cardiac stents, mi)

## 2022-11-03 NOTE — ED Provider Notes (Signed)
[facility] EMERGENCY DEPARTMENT Provider Note   CSN: [id number] Arrival date & time: 11/03/22  1924     History  Chief Complaint  Patient presents with   Fall   Alcohol Intoxication     Carl Townsend is a 76 y.o. male  who presents to the Emergency Department complaining of a mechanical fall onset PTA. Pt notes that he fell due to tripping over an unleveled surface. Also notes chest pain earlier today prior to the onset of his fall. Notes that his took a NTG prior to the fall and notes that his pain was mild. Has a follow up appointment with his cardiologist on 11/20/22. Denies LOC, nausea, vomiting, neck pain, back pain, abodminal pain. No anticoagulant use.    The history is provided by the patient. No language interpreter was used.       Home Medications Prior to Admission medications   Medication Sig Start Date End Date Taking? Authorizing Provider  albuterol (VENTOLIN HFA) 108 (90 Base) MCG/ACT inhaler Inhale 2 puffs into the lungs every 4 (four) hours as needed for wheezing or shortness of breath. 01/08/21   Cuthriell, Charline Bills, PA-C  ascorbic acid (VITAMIN C) 500 MG tablet Take 500 mg by mouth daily.    [provider]  aspirin 81 MG tablet Take 81 mg by mouth daily.    [provider]  atenolol (TENORMIN) 50 MG tablet Take 50 mg by mouth at bedtime.    [provider]  atorvastatin (LIPITOR) 80 MG tablet TAKE 1 TABLET BY MOUTH EVERYDAY AT BEDTIME 04/25/22   Allred, Jeneen Rinks, MD  candesartan (ATACAND) 16 MG tablet Take 16 mg by mouth daily. 12/16/20   [provider]  Carboxymethylcellulose Sodium (REFRESH LIQUIGEL) 1 % GEL Place 1 drop into both eyes at bedtime.     [provider]  clopidogrel (PLAVIX) 75 MG tablet Take 75 mg by mouth daily. 04/26/22   [provider]  cyanocobalamin 1000 MCG tablet Take 1,000 mcg by mouth daily.    [provider]  ergocalciferol (VITAMIN D2) 1.25 MG  (50000 UT) capsule Take 50,000 Units by mouth once a week.    [provider]  Glycerin-Polysorbate 80 (REFRESH DRY EYE THERAPY OP) Place 1 drop into both eyes daily as needed (dry eye).    [provider]  isosorbide mononitrate (IMDUR) 60 MG 24 hr tablet Take 1 tablet (60 mg total) by mouth daily. 04/11/22   Belva Crome, MD  Multiple Vitamin (MULTIVITAMIN) tablet Take 1 tablet by mouth daily.    [provider]  nitroGLYCERIN (NITROSTAT) 0.4 MG SL tablet Place 1 tablet (0.4 mg total) under the tongue every 5 (five) minutes as needed. 01/13/21   Bhagat, Crista Luria, PA  pantoprazole (PROTONIX) 40 MG tablet Take 40 mg by mouth daily. 04/26/22   [provider]  zinc gluconate 50 MG tablet Take 50 mg by mouth daily.    [provider]      Allergies    Patient has no known allergies.    Review of Systems   Review of Systems  All other systems reviewed and are negative.   Physical Exam Updated Vital Signs BP 118/84   Pulse (!) 101   Temp 97.6 F (36.4 C) (Oral)   Resp (!) 21   SpO2 99%  Physical Exam Vitals and nursing note reviewed.  Constitutional:      General: He is not in acute distress.    Appearance: He is  not diaphoretic.  HENT:     Head: Normocephalic and atraumatic.     Mouth/Throat:     Pharynx: No oropharyngeal exudate.     Comments: 1 cm laceration noted to the inner lower lip. 0.5 cm laceration noted to the inner mucosa of the inner lower lip.  Eyes:     General: No scleral icterus.    Conjunctiva/sclera: Conjunctivae normal.  Cardiovascular:     Rate and Rhythm: Normal rate and regular rhythm.     Pulses: Normal pulses.     Heart sounds: Normal heart sounds.  Pulmonary:     Effort: Pulmonary effort is normal. No respiratory distress.     Breath sounds: Normal breath sounds. No wheezing.  Abdominal:     General: Bowel sounds are normal.     Palpations: Abdomen is soft. There is no mass.     Tenderness: There is  no abdominal tenderness. There is no guarding or rebound.  Musculoskeletal:        General: Normal range of motion.     Cervical back: Normal range of motion and neck supple.  Skin:    General: Skin is warm and dry.  Neurological:     Mental Status: He is alert.  Psychiatric:        Behavior: Behavior normal.     ED Results / Procedures / Treatments   Labs (all labs ordered are listed, but only abnormal results are displayed) Labs Reviewed  BASIC METABOLIC PANEL - Abnormal; Notable for the following components:      Result Value   Sodium 129 (*)    Chloride 97 (*)    CO2 20 (*)    Creatinine, Ser 1.60 (*)    Calcium 8.6 (*)    GFR, Estimated 44 (*)    All other components within normal limits  CBC WITH DIFFERENTIAL/PLATELET - Abnormal; Notable for the following components:   RBC 3.54 (*)    Hemoglobin 12.2 (*)    HCT 36.5 (*)    MCV 103.1 (*)    MCH 34.5 (*)    All other components within normal limits  TROPONIN I (HIGH SENSITIVITY)  TROPONIN I (HIGH SENSITIVITY)    EKG None  Radiology CT Head Wo Contrast  Result Date: 11/03/2022 CLINICAL DATA:  Status post slip and fall after being at football game. Patient bit his tongue. EXAM: CT HEAD WITHOUT CONTRAST CT MAXILLOFACIAL WITHOUT CONTRAST CT CERVICAL SPINE WITHOUT CONTRAST TECHNIQUE: Multidetector CT imaging of the head, cervical spine, and maxillofacial structures were performed using the standard protocol without intravenous contrast. Multiplanar CT image reconstructions of the cervical spine and maxillofacial structures were also generated. RADIATION DOSE REDUCTION: This exam was performed according to the departmental dose-optimization program which includes automated exposure control, adjustment of the mA and/or kV according to patient size and/or use of iterative reconstruction technique. COMPARISON:  None Available. FINDINGS: CT HEAD FINDINGS Brain: No evidence of acute infarction, hemorrhage, hydrocephalus,  extra-axial collection or mass lesion/mass effect. Diffuse low-attenuation of the periventricular and subcortical white matter presumed advanced chronic microvascular ischemic changes. Vascular: No hyperdense vessel or unexpected calcification. Skull: Normal. Negative for fracture or focal lesion. Other: Left scleral band. CT MAXILLOFACIAL FINDINGS Osseous: No fracture or mandibular dislocation. No destructive process. Orbits: Negative. No traumatic or inflammatory finding. Sinuses: Mucosal thickening of bilateral maxillary sinuses and ethmoid air cells suggesting chronic paranasal sinus disease. Mastoid air cells are clear. Soft tissues: Soft tissue hematoma about the right mandibular region with surrounding edema. CT CERVICAL  SPINE FINDINGS Alignment: Mild retrolisthesis of C3. Skull base and vertebrae: No acute fracture. No primary bone lesion or focal pathologic process. Soft tissues and spinal canal: No prevertebral fluid or swelling. No visible canal hematoma. Disc levels: Moderate multilevel degenerate disc disease with disc height loss and associated uncovertebral joint and facet joint arthropathy. Upper chest: Advanced emphysematous changes of bilateral upper lobes. Other: None IMPRESSION: CT HEAD: 1. No acute intracranial abnormality. 2. 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 CT MAXILLOFACIAL: 1. No acute facial bone fracture. 2. Soft tissue hematoma about the right mandibular region with surrounding soft tissue swelling. CT CERVICAL SPINE: 1. No acute fracture or traumatic subluxation. 2. 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 3. Advanced emphysematous changes of bilateral upper lobes. Emphysema (ICD10-J43.9). Electronically Signed   By: Keane Police D.O.   On: 11/03/2022 21:34   CT Maxillofacial Wo Contrast  Result Date: 11/03/2022 CLINICAL DATA:  Status post slip and fall after being at football game. Patient  bit his tongue. EXAM: CT HEAD WITHOUT CONTRAST CT MAXILLOFACIAL WITHOUT CONTRAST CT CERVICAL SPINE WITHOUT CONTRAST TECHNIQUE: Multidetector CT imaging of the head, cervical spine, and maxillofacial structures were performed using the standard protocol without intravenous contrast. Multiplanar CT image reconstructions of the cervical spine and maxillofacial structures were also generated. RADIATION DOSE REDUCTION: This exam was performed according to the departmental dose-optimization program which includes automated exposure control, adjustment of the mA and/or kV according to patient size and/or use of iterative reconstruction technique. COMPARISON:  None Available. FINDINGS: CT HEAD FINDINGS Brain: No evidence of acute infarction, hemorrhage, hydrocephalus, extra-axial collection or mass lesion/mass effect. Diffuse low-attenuation of the periventricular and subcortical white matter presumed advanced chronic microvascular ischemic changes. Vascular: No hyperdense vessel or unexpected calcification. Skull: Normal. Negative for fracture or focal lesion. Other: Left scleral band. CT MAXILLOFACIAL FINDINGS Osseous: No fracture or mandibular dislocation. No destructive process. Orbits: Negative. No traumatic or inflammatory finding. Sinuses: Mucosal thickening of bilateral maxillary sinuses and ethmoid air cells suggesting chronic paranasal sinus disease. Mastoid air cells are clear. Soft tissues: Soft tissue hematoma about the right mandibular region with surrounding edema. CT CERVICAL SPINE FINDINGS Alignment: Mild retrolisthesis of C3. Skull base and vertebrae: No acute fracture. No primary bone lesion or focal pathologic process. Soft tissues and spinal canal: No prevertebral fluid or swelling. No visible canal hematoma. Disc levels: Moderate multilevel degenerate disc disease with disc height loss and associated uncovertebral joint and facet joint arthropathy. Upper chest: Advanced emphysematous changes of  bilateral upper lobes. Other: None IMPRESSION: CT HEAD: 1. No acute intracranial abnormality. 2. 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 CT MAXILLOFACIAL: 1. No acute facial bone fracture. 2. Soft tissue hematoma about the right mandibular region with surrounding soft tissue swelling. CT CERVICAL SPINE: 1. No acute fracture or traumatic subluxation. 2. 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 3. Advanced emphysematous changes of bilateral upper lobes. Emphysema (ICD10-J43.9). Electronically Signed   By: Keane Police D.O.   On: 11/03/2022 21:34   CT Cervical Spine Wo Contrast  Result Date: 11/03/2022 CLINICAL DATA:  Status post slip and fall after being at football game. Patient bit his tongue. EXAM: CT HEAD WITHOUT CONTRAST CT MAXILLOFACIAL WITHOUT CONTRAST CT CERVICAL SPINE WITHOUT CONTRAST TECHNIQUE: Multidetector CT imaging of the head, cervical spine, and maxillofacial structures were performed using the standard protocol without intravenous contrast. Multiplanar CT image reconstructions of the cervical spine  and maxillofacial structures were also generated. RADIATION DOSE REDUCTION: This exam was performed according to the departmental dose-optimization program which includes automated exposure control, adjustment of the mA and/or kV according to patient size and/or use of iterative reconstruction technique. COMPARISON:  None Available. FINDINGS: CT HEAD FINDINGS Brain: No evidence of acute infarction, hemorrhage, hydrocephalus, extra-axial collection or mass lesion/mass effect. Diffuse low-attenuation of the periventricular and subcortical white matter presumed advanced chronic microvascular ischemic changes. Vascular: No hyperdense vessel or unexpected calcification. Skull: Normal. Negative for fracture or focal lesion. Other: Left scleral band. CT MAXILLOFACIAL FINDINGS Osseous: No fracture or mandibular dislocation.  No destructive process. Orbits: Negative. No traumatic or inflammatory finding. Sinuses: Mucosal thickening of bilateral maxillary sinuses and ethmoid air cells suggesting chronic paranasal sinus disease. Mastoid air cells are clear. Soft tissues: Soft tissue hematoma about the right mandibular region with surrounding edema. CT CERVICAL SPINE FINDINGS Alignment: Mild retrolisthesis of C3. Skull base and vertebrae: No acute fracture. No primary bone lesion or focal pathologic process. Soft tissues and spinal canal: No prevertebral fluid or swelling. No visible canal hematoma. Disc levels: Moderate multilevel degenerate disc disease with disc height loss and associated uncovertebral joint and facet joint arthropathy. Upper chest: Advanced emphysematous changes of bilateral upper lobes. Other: None IMPRESSION: CT HEAD: 1. No acute intracranial abnormality. 2. 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 CT MAXILLOFACIAL: 1. No acute facial bone fracture. 2. Soft tissue hematoma about the right mandibular region with surrounding soft tissue swelling. CT CERVICAL SPINE: 1. No acute fracture or traumatic subluxation. 2. 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 3. Advanced emphysematous changes of bilateral upper lobes. Emphysema (ICD10-J43.9). Electronically Signed   By: Keane Police D.O.   On: 11/03/2022 21:34   DG Chest 2 View  Result Date: 11/03/2022 CLINICAL DATA:  Chest pain.  Slip and fall. EXAM: CHEST - 2 VIEW COMPARISON:  01/11/2021 FINDINGS: The heart is normal in size. Stable mild aortic tortuosity, otherwise normal mediastinal contours. The lungs are clear. Pulmonary vasculature is normal. No consolidation, pleural effusion, or pneumothorax. No acute osseous abnormalities are seen. IMPRESSION: No acute chest findings or explanation for chest pain. Electronically Signed   By: Keith Rake M.D.   On: 11/03/2022 20:45     Procedures .[name] KitchenLaceration Repair  Date/Time: 11/03/2022 10:59 PM  Performed by: Steva Colder A, PA-C Authorized by: Nehemiah Settle, PA-C   Consent:    Consent obtained:  Verbal   Consent given by:  Patient   Risks discussed:  Infection, pain and need for additional repair Universal protocol:    Patient identity confirmed:  Verbally with patient and hospital-assigned identification number Anesthesia:    Anesthesia method:  Local infiltration   Local anesthetic:  Lidocaine 1% w/o epi Laceration details:    Location:  Mouth   Length (cm):  0.5 Pre-procedure details:    Preparation:  Patient was prepped and draped in usual sterile fashion Exploration:    Hemostasis achieved with:  Direct pressure   Imaging outcome: foreign body not noted     Wound exploration: entire depth of wound visualized   Treatment:    Area cleansed with:  Saline   Amount of cleaning:  Standard   Irrigation solution:  Sterile saline   Irrigation method:  Syringe Skin repair:    Repair method:  Sutures   Suture size:  5-0   Wound skin closure material used: vicryl.   Suture technique:  Simple interrupted  Number of sutures:  1 Repair type:    Repair type:  Simple Post-procedure details:    Dressing:  Open (no dressing)   Procedure completion:  Tolerated well, no immediate complications .[name] KitchenLaceration Repair  Date/Time: 11/03/2022 11:59 PM  Performed by: Steva Colder A, PA-C Authorized by: Nehemiah Settle, PA-C   Consent:    Consent obtained:  Verbal   Consent given by:  Patient   Risks discussed:  Infection, pain and need for additional repair Universal protocol:    Patient identity confirmed:  Verbally with patient and hospital-assigned identification number Anesthesia:    Anesthesia method:  Local infiltration   Local anesthetic:  Lidocaine 1% w/o epi Laceration details:    Location:  Lip   Lip location:  Lower interior lip   Length (cm):  1 Pre-procedure details:    Preparation:   Patient was prepped and draped in usual sterile fashion Exploration:    Hemostasis achieved with:  Direct pressure   Imaging outcome: foreign body not noted     Wound exploration: entire depth of wound visualized   Treatment:    Area cleansed with:  Saline   Amount of cleaning:  Standard   Irrigation solution:  Sterile saline   Irrigation method:  Syringe Skin repair:    Repair method:  Sutures   Suture size:  5-0   Wound skin closure material used: vicryl.   Suture technique:  Simple interrupted   Number of sutures:  3 Approximation:    Approximation:  Close Repair type:    Repair type:  Simple Post-procedure details:    Dressing:  Open (no dressing)   Procedure completion:  Tolerated well, no immediate complications     Medications Ordered in ED Medications  lidocaine (PF) (XYLOCAINE) 1 % injection 5 mL (has no administration in time range)  sodium chloride 0.9 % bolus 500 mL (0 mLs Intravenous Stopped 11/03/22 2247)  oxyCODONE-acetaminophen (PERCOCET/ROXICET) 5-325 MG per tablet 1 tablet (1 tablet Oral Given 11/03/22 2255)    ED Course/ Medical Decision Making/ A&P Clinical Course as of 11/04/22 0023  Sat Nov 03, 2022  2340 Pt re-evaluated and noted no chest pain at this time. Attending at bedside at this time.  [SB]    Clinical Course User Index [SB] Corrigan Kretschmer A, PA-C                           Medical Decision Making Amount and/or Complexity of Data Reviewed Labs: ordered. Radiology: ordered.  Risk Prescription drug management.   Pt with fall occurring PTA. Pt notes that he was consuming ETOH at [facility] [employer] homecoming when he had a mechanical fall due to unlevel ground. Notes that he hit his head, denies LOC. No nausea, vomiting, vision changes, shortness of breath, chest pain at this time. No anticoagulant use. Pt afebrile. On exam, patient with 1 cm laceration noted to the inner lower lip. 0.5 cm laceration noted to the inner mucosa of the inner lower lip. No  acute cardiovascular, respiratory, or abdominal exam findings. Differential diagnosis includes fracture, dislocation, herniation, hypoglycemia, electrolyte abnormality, arrhythmia, anemia.   Labs:  I ordered, and personally interpreted labs.  The pertinent results include:   CBC unremarkable BMP with decreased sodium, however, stable from previous values, Cr at 1.6, stabel from previous findings Initial troponin at 6, delta troponin ordered with results pending at time of sign-out.   Imaging: I ordered imaging studies including Chest x-ray CT head without  CT cervical spine CT maxillofacial I independently visualized and interpreted imaging which showed: no acute cardiopulmonary findings. No acute findings noted on CT scans.  I agree with the radiologist interpretation  Medications:  I ordered medication including IVF, percocet for pain management Reevaluation of the patient after these medicines and interventions, I reevaluated the patient and found that they have improved I have reviewed the patients home medicines and have made adjustments as needed  Patient case discussed with Silverio Decamp, PA-C at sign-out. Plan at sign-out is pending troponin, likely Discharge home, however, plans may change as per oncoming team. Patient care transferred at sign out.   Disposition: Presentation suspicious for fall and lip laceration.  Doubt fracture, dislocation, herniation.  Doubt anemia, sodium stable from previous value.  Creatinine and GFR stable from previous values.  Tachycardia likely in the setting of patient's increased anxiety.  Patient notes that he is anxious prior to the laceration repair.  After consideration of the diagnostic results and the patients response to treatment, I feel that the patient would benefit from Discharge home. Case discussed with attending who evaluated patient and agrees with discharge treatment plan. Supportive care and return precautions discussed with patient.   Appears safe for discharge at this time.  Follow-up as indicated discharge paperwork.  This chart was dictated using voice recognition software, Dragon. Despite the best efforts of this provider to proofread and correct errors, errors may still occur which can change documentation meaning.   Final Clinical Impression(s) / ED Diagnoses Final diagnoses:  Fall, initial encounter  Hyponatremia    Rx / DC Orders ED Discharge Orders     None         Delane Wessinger A, PA-C 11/04/22 0023    Audley Hose, MD 11/11/22 [phone]

## 2022-11-04 LAB — TROPONIN I (HIGH SENSITIVITY): Troponin I (High Sensitivity): 6 ng/L (ref <18)

## 2022-11-04 NOTE — Discharge Instructions (Signed)
It was a pleasure taking care of you today!  Your labs overall did not show any emergent, concerning findings.  Your CT scans did not show any concerning findings.  It is important that you call your cardiologist as well as primary care provider to set up a follow-up appointment regarding today's ED visit.  For your sutures: You had absorbable sutures placed today, they will absorb on their own.  Ensure for the next couple of days that you consume soft foods.   You may follow-up in the emergency department if you are experiencing increasing/worsening symptoms.

## 2022-11-18 NOTE — Progress Notes (Signed)
Cardiology Office Note:    Date:  11/20/2022   ID:  Shawna Clamp, DOB 11-28-1946, MRN [id number]  PCP:  Barbette Reichmann, MD  Cardiologist:  Lesleigh Noe, MD   Referring MD: Barbette Reichmann, MD   Chief Complaint  Patient presents with   Coronary Artery Disease    History of Present Illness:    Carl Townsend is a 76 y.o. male with a hx of non-ST elevation MI January 2022 leading to ostial circumflex stent implantation, CKD stage III, primary hypertension, hyperlipidemia, prediabetes, and  recurrent angina/CP 01/2022 --> up-titration of nitrate therapy.  He may occasionally use nitroglycerin after eating.  He gets vague chest discomfort.  He is having no discomfort with activity.  At [facility], he has some alcohol and fell when leaving the stadium causing injury to the right side of his face.  He was seen in the emergency room and cleared to return home.  He denies orthopnea, and PND.  Past Medical History:  Diagnosis Date   Arthritis    neck and shoulders   Chronic kidney disease    chronic kidney disease - stage 3, GFR 30-59 ml/min   Colon cancer [facility])    ED (erectile dysfunction)    History of 2019 novel coronavirus disease (COVID-19) 08/23/2020   Hyperlipidemia    Hypertension    Inguinal hernia    NSTEMI (non-ST elevated myocardial infarction) (HCC)    Pre-diabetes    Tubular adenoma of colon     Past Surgical History:  Procedure Laterality Date   COLON SURGERY Right 05/06   colectomy and sigmoid colectomy    COLONOSCOPY WITH PROPOFOL N/A 03/13/2016   Procedure: COLONOSCOPY WITH PROPOFOL;  Surgeon: Christena Deem, MD;  Location: [facility] ENDOSCOPY;  Service: Endoscopy;  Laterality: N/A;   COLONOSCOPY WITH PROPOFOL N/A 03/09/2020   Procedure: COLONOSCOPY WITH PROPOFOL;  Surgeon: Toledo, Boykin Nearing, MD;  Location: [facility] ENDOSCOPY;  Service: Gastroenterology;  Laterality: N/A;   CORONARY STENT INTERVENTION N/A 01/12/2021   Procedure: CORONARY STENT  INTERVENTION;  Surgeon: Lyn Records, MD;  Location: [facility] INVASIVE CV LAB;  Service: Cardiovascular;  Laterality: N/A;   EYE SURGERY Left    vitreous retinal surgery   IABP INSERTION N/A 01/12/2021   Procedure: IABP Insertion;  Surgeon: Alwyn Pea, MD;  Location: [facility] INVASIVE CV LAB;  Service: Cardiovascular;  Laterality: N/A;   INTRAVASCULAR ULTRASOUND/IVUS N/A 01/12/2021   Procedure: Intravascular Ultrasound/IVUS;  Surgeon: Lyn Records, MD;  Location: [facility] INVASIVE CV LAB;  Service: Cardiovascular;  Laterality: N/A;   LEFT HEART CATH AND CORONARY ANGIOGRAPHY N/A 01/12/2021   Procedure: LEFT HEART CATH AND CORONARY ANGIOGRAPHY possible PCI and stent;  Surgeon: Alwyn Pea, MD;  Location: [facility] INVASIVE CV LAB;  Service: Cardiovascular;  Laterality: N/A;   right foot surgery     XI ROBOTIC ASSISTED INGUINAL HERNIA REPAIR WITH MESH Bilateral 11/10/2020   Procedure: XI ROBOTIC ASSISTED INGUINAL HERNIA REPAIR WITH MESH;  Surgeon: Sung Amabile, DO;  Location: [facility] ORS;  Service: General;  Laterality: Bilateral;    Current Medications: Current Meds  Medication Sig   ascorbic acid (VITAMIN C) 500 MG tablet Take 500 mg by mouth daily.   aspirin 81 MG tablet Take 81 mg by mouth daily.   atenolol (TENORMIN) 50 MG tablet Take 50 mg by mouth at bedtime.   atorvastatin (LIPITOR) 80 MG tablet TAKE 1 TABLET BY MOUTH EVERYDAY AT BEDTIME   candesartan (ATACAND) 16 MG tablet Take 16 mg by  mouth daily.   Carboxymethylcellulose Sodium (REFRESH LIQUIGEL) 1 % GEL Place 1 drop into both eyes at bedtime.    clopidogrel (PLAVIX) 75 MG tablet Take 75 mg by mouth daily.   cyanocobalamin 1000 MCG tablet Take 1,000 mcg by mouth daily.   ergocalciferol (VITAMIN D2) 1.25 MG (50000 UT) capsule Take 50,000 Units by mouth once a week.   Glycerin-Polysorbate 80 (REFRESH DRY EYE THERAPY OP) Place 1 drop into both eyes daily as needed (dry eye).   iron polysaccharides (NIFEREX) 150 MG capsule Take 150 mg by  mouth daily.   isosorbide mononitrate (IMDUR) 60 MG 24 hr tablet Take 1 tablet (60 mg total) by mouth daily.   Multiple Vitamin (MULTIVITAMIN) tablet Take 1 tablet by mouth daily.   nitroGLYCERIN (NITROSTAT) 0.4 MG SL tablet Place 1 tablet (0.4 mg total) under the tongue every 5 (five) minutes as needed.   pantoprazole (PROTONIX) 40 MG tablet Take 40 mg by mouth daily.   sildenafil (REVATIO) 20 MG tablet Take 20 mg by mouth as needed.   zinc gluconate 50 MG tablet Take 50 mg by mouth daily.     Allergies:   Patient has no known allergies.   Social History   Socioeconomic History   Marital status: Married    Spouse name: Not on file   Number of children: Not on file   Years of education: Not on file   Highest education level: Not on file  Occupational History   Not on file  Tobacco Use   Smoking status: Former    Types: Cigarettes    Quit date: 2000    Years since quitting: 23.9   Smokeless tobacco: Never  Vaping Use   Vaping Use: Never used  Substance and Sexual Activity   Alcohol use: Yes    Comment: glass of wine "once in a while"    Drug use: No   Sexual activity: Not on file  Other Topics Concern   Not on file  Social History Narrative   Not on file   Social Determinants of Health   Financial Resource Strain: Not on file  Food Insecurity: Not on file  Transportation Needs: Not on file  Physical Activity: Not on file  Stress: Not on file  Social Connections: Not on file     Family History: The patient's family history includes Heart attack in his brother and father; Heart disease in his brother; Heart failure in his mother.  ROS:   Please see the history of present illness.    Headaches for the past month.  Uncertain why.  All other systems reviewed and are negative.  EKGs/Labs/Other Studies Reviewed:    The following studies were reviewed today: No new or recent cardiac evaluation.  EKG:  EKG not repeated.  An EKG performed on 11/05/2022 revealed normal  sinus rhythm, prominent voltage, and no evidence of ischemia.  Recent Labs: 11/03/2022: BUN 15; Creatinine, Ser 1.60; Hemoglobin 12.2; Platelets 340; Potassium 5.1; Sodium 129  Recent Lipid Panel    Component Value Date/Time   CHOL 147 01/12/2021 0252   TRIG 77 01/12/2021 0252   HDL 45 01/12/2021 0252   CHOLHDL 3.3 01/12/2021 0252   VLDL 15 01/12/2021 0252   LDLCALC 87 01/12/2021 0252    Physical Exam:    VS:  BP 122/80   Pulse 61   Ht 5\' 11"  (1.803 m)   Wt 126 lb (57.2 kg)   SpO2 98%   BMI 17.57 kg/m     Wt  Readings from Last 3 Encounters:  11/20/22 126 lb (57.2 kg)  05/15/22 130 lb 12.8 oz (59.3 kg)  04/11/22 135 lb 9.6 oz (61.5 kg)     GEN: Slender. No acute distress HEENT: Normal NECK: No JVD. LYMPHATICS: No lymphadenopathy CARDIAC: No murmur. RRR S4 gallop, or edema. VASCULAR:  Normal Pulses. No bruits. RESPIRATORY:  Clear to auscultation without rales, wheezing or rhonchi  ABDOMEN: Soft, non-tender, non-distended, No pulsatile mass, MUSCULOSKELETAL: No deformity  SKIN: Warm and dry NEUROLOGIC:  Alert and oriented x 3 PSYCHIATRIC:  Normal affect   ASSESSMENT:    1. CAD in native artery   2. Mixed hyperlipidemia   3. Essential hypertension    PLAN:    In order of problems listed above:  Stable with minimal angina.  We discussed secondary prevention. Continue high intensity statin therapy.  Most recent LDL was 30 on Lipitor 80 mg daily.  Continue current therapy. Blood pressure control is excellent.  No change.  Overall education and awareness concerning primary/secondary risk prevention was discussed in detail: LDL less than 70, hemoglobin A1c less than 7, blood pressure target less than 130/80 mmHg, >150 minutes of moderate aerobic activity per week, avoidance of smoking, weight control (via diet and exercise), and continued surveillance/management of/for obstructive sleep apnea.    Medication Adjustments/Labs and Tests Ordered: Current medicines are  reviewed at length with the patient today.  Concerns regarding medicines are outlined above.  No orders of the defined types were placed in this encounter.  No orders of the defined types were placed in this encounter.   Patient Instructions  Medication Instructions:  Your physician recommends that you continue on your current medications as directed. Please refer to the Current Medication list given to you today.  *If you need a refill on your cardiac medications before your next appointment, please call your pharmacy*   Lab Work: NONE  Testing/Procedures: NONE  Follow-Up: As needed  Important Information About Sugar         Signed, Lesleigh Noe, MD  11/20/2022 11:57 AM    [facility]

## 2022-11-20 ENCOUNTER — Encounter: Payer: Self-pay | Admitting: Interventional Cardiology

## 2022-11-20 ENCOUNTER — Ambulatory Visit: Payer: Medicare Other | Attending: Interventional Cardiology | Admitting: Interventional Cardiology

## 2022-11-20 VITALS — BP 122/80 | HR 61 | Ht 71.0 in | Wt 126.0 lb

## 2022-11-20 DIAGNOSIS — I10 Essential (primary) hypertension: Secondary | ICD-10-CM | POA: Insufficient documentation

## 2022-11-20 DIAGNOSIS — E782 Mixed hyperlipidemia: Secondary | ICD-10-CM | POA: Insufficient documentation

## 2022-11-20 DIAGNOSIS — I2510 Atherosclerotic heart disease of native coronary artery without angina pectoris: Secondary | ICD-10-CM | POA: Diagnosis not present

## 2022-11-20 DIAGNOSIS — I70213 Atherosclerosis of native arteries of extremities with intermittent claudication, bilateral legs: Secondary | ICD-10-CM | POA: Diagnosis not present

## 2022-11-20 NOTE — Patient Instructions (Signed)
Medication Instructions:  Your physician recommends that you continue on your current medications as directed. Please refer to the Current Medication list given to you today.  *If you need a refill on your cardiac medications before your next appointment, please call your pharmacy*   Lab Work: NONE  Testing/Procedures: NONE  Follow-Up: As needed  Important Information About Sugar

## 2023-02-06 DIAGNOSIS — I70203 Unspecified atherosclerosis of native arteries of extremities, bilateral legs: Secondary | ICD-10-CM | POA: Insufficient documentation

## 2023-02-07 ENCOUNTER — Ambulatory Visit (INDEPENDENT_AMBULATORY_CARE_PROVIDER_SITE_OTHER): Payer: Medicare Other

## 2023-02-07 ENCOUNTER — Ambulatory Visit (INDEPENDENT_AMBULATORY_CARE_PROVIDER_SITE_OTHER): Payer: Medicare Other | Admitting: Vascular Surgery

## 2023-02-07 DIAGNOSIS — I70213 Atherosclerosis of native arteries of extremities with intermittent claudication, bilateral legs: Secondary | ICD-10-CM

## 2023-02-08 LAB — VAS US ABI WITH/WO TBI: Left ABI: 1.07

## 2023-02-18 ENCOUNTER — Other Ambulatory Visit: Payer: Self-pay

## 2023-02-18 MED ORDER — ISOSORBIDE MONONITRATE ER 60 MG PO TB24: 60.0000 mg | ORAL_TABLET | Freq: Every day | ORAL | 3 refills | Status: DC

## 2023-06-24 ENCOUNTER — Observation Stay
Admission: EM | Admit: 2023-06-24 | Discharge: 2023-06-26 | Disposition: A | Discharge status: Home / Self Care | Payer: Medicare Other | Attending: Internal Medicine | Admitting: Internal Medicine

## 2023-06-24 ENCOUNTER — Other Ambulatory Visit: Payer: Self-pay

## 2023-06-24 ENCOUNTER — Emergency Department: Payer: Medicare Other

## 2023-06-24 DIAGNOSIS — R55 Syncope and collapse: Secondary | ICD-10-CM

## 2023-06-24 DIAGNOSIS — I10 Essential (primary) hypertension: Secondary | ICD-10-CM | POA: Diagnosis present

## 2023-06-24 DIAGNOSIS — W19XXXA Unspecified fall, initial encounter: Secondary | ICD-10-CM | POA: Diagnosis not present

## 2023-06-24 DIAGNOSIS — Z85038 Personal history of other malignant neoplasm of large intestine: Secondary | ICD-10-CM | POA: Insufficient documentation

## 2023-06-24 DIAGNOSIS — Z79899 Other long term (current) drug therapy: Secondary | ICD-10-CM | POA: Insufficient documentation

## 2023-06-24 DIAGNOSIS — I129 Hypertensive chronic kidney disease with stage 1 through stage 4 chronic kidney disease, or unspecified chronic kidney disease: Secondary | ICD-10-CM | POA: Insufficient documentation

## 2023-06-24 DIAGNOSIS — Z7982 Long term (current) use of aspirin: Secondary | ICD-10-CM | POA: Diagnosis not present

## 2023-06-24 DIAGNOSIS — N1831 Chronic kidney disease, stage 3a: Secondary | ICD-10-CM | POA: Insufficient documentation

## 2023-06-24 DIAGNOSIS — I5032 Chronic diastolic (congestive) heart failure: Secondary | ICD-10-CM | POA: Diagnosis not present

## 2023-06-24 DIAGNOSIS — Z955 Presence of coronary angioplasty implant and graft: Secondary | ICD-10-CM | POA: Diagnosis not present

## 2023-06-24 DIAGNOSIS — S22030A Wedge compression fracture of third thoracic vertebra, initial encounter for closed fracture: Secondary | ICD-10-CM | POA: Diagnosis not present

## 2023-06-24 DIAGNOSIS — N179 Acute kidney failure, unspecified: Secondary | ICD-10-CM | POA: Diagnosis not present

## 2023-06-24 DIAGNOSIS — K5730 Diverticulosis of large intestine without perforation or abscess without bleeding: Secondary | ICD-10-CM | POA: Diagnosis not present

## 2023-06-24 DIAGNOSIS — R2689 Other abnormalities of gait and mobility: Secondary | ICD-10-CM | POA: Insufficient documentation

## 2023-06-24 DIAGNOSIS — I2510 Atherosclerotic heart disease of native coronary artery without angina pectoris: Secondary | ICD-10-CM | POA: Insufficient documentation

## 2023-06-24 DIAGNOSIS — Z7902 Long term (current) use of antithrombotics/antiplatelets: Secondary | ICD-10-CM | POA: Diagnosis not present

## 2023-06-24 DIAGNOSIS — Y92190 Kitchen in other specified residential institution as the place of occurrence of the external cause: Secondary | ICD-10-CM | POA: Diagnosis not present

## 2023-06-24 DIAGNOSIS — I130 Hypertensive heart and chronic kidney disease with heart failure and stage 1 through stage 4 chronic kidney disease, or unspecified chronic kidney disease: Secondary | ICD-10-CM | POA: Insufficient documentation

## 2023-06-24 DIAGNOSIS — Z87891 Personal history of nicotine dependence: Secondary | ICD-10-CM | POA: Insufficient documentation

## 2023-06-24 DIAGNOSIS — M6281 Muscle weakness (generalized): Secondary | ICD-10-CM | POA: Insufficient documentation

## 2023-06-24 LAB — CBC WITH DIFFERENTIAL/PLATELET
Abs Immature Granulocytes: 0 10*3/uL (ref 0.00–0.07)
Basophils Absolute: 0 10*3/uL (ref 0.0–0.1)
Basophils Relative: 1 %
Eosinophils Absolute: 0.1 10*3/uL (ref 0.0–0.5)
Eosinophils Relative: 2 %
HCT: 38.5 % — ABNORMAL LOW (ref 39.0–52.0)
Hemoglobin: 12.8 g/dL — ABNORMAL LOW (ref 13.0–17.0)
Immature Granulocytes: 0 %
Lymphocytes Relative: 28 %
Lymphs Abs: 0.9 10*3/uL (ref 0.7–4.0)
MCH: 34.6 pg — ABNORMAL HIGH (ref 26.0–34.0)
MCHC: 33.2 g/dL (ref 30.0–36.0)
MCV: 104.1 fL — ABNORMAL HIGH (ref 80.0–100.0)
Monocytes Absolute: 0.5 10*3/uL (ref 0.1–1.0)
Monocytes Relative: 16 %
Neutro Abs: 1.8 10*3/uL (ref 1.7–7.7)
Neutrophils Relative %: 53 %
Platelets: 213 10*3/uL (ref 150–400)
RBC: 3.7 MIL/uL — ABNORMAL LOW (ref 4.22–5.81)
RDW: 13 % (ref 11.5–15.5)
WBC: 3.4 10*3/uL — ABNORMAL LOW (ref 4.0–10.5)
nRBC: 0 % (ref 0.0–0.2)

## 2023-06-24 LAB — TROPONIN I (HIGH SENSITIVITY)
Troponin I (High Sensitivity): 8 ng/L (ref <18)
Troponin I (High Sensitivity): 9 ng/L (ref <18)

## 2023-06-24 LAB — BASIC METABOLIC PANEL
Anion gap: 11 (ref 5–15)
BUN: 37 mg/dL — ABNORMAL HIGH (ref 8–23)
CO2: 24 mmol/L (ref 22–32)
Calcium: 9.5 mg/dL (ref 8.9–10.3)
Chloride: 99 mmol/L (ref 98–111)
Creatinine, Ser: 2.58 mg/dL — ABNORMAL HIGH (ref 0.61–1.24)
GFR, Estimated: 25 mL/min — ABNORMAL LOW (ref >60)
Glucose, Bld: 119 mg/dL — ABNORMAL HIGH (ref 70–99)
Potassium: 3.9 mmol/L (ref 3.5–5.1)
Sodium: 134 mmol/L — ABNORMAL LOW (ref 135–145)

## 2023-06-24 MED ORDER — SODIUM CHLORIDE 0.9 % IV BOLUS
1000.0000 mL | Freq: Once | INTRAVENOUS | Status: AC
  Administered 2023-06-25: 1000 mL via INTRAVENOUS

## 2023-06-24 NOTE — ED Triage Notes (Signed)
Patient is here for a syncopal episode 2 - 3 days ago; States that he was standing in his kitchen when this occurred and hit his chest on the cabinets and landed on his tail bone; Patient does have a cardiac history and c/o back pain from "bottom of my head to my tailbone"

## 2023-06-25 ENCOUNTER — Observation Stay
Admit: 2023-06-25 | Discharge: 2023-06-25 | Disposition: A | Discharge status: Home / Self Care | Payer: Medicare Other | Attending: Internal Medicine | Admitting: Internal Medicine

## 2023-06-25 ENCOUNTER — Emergency Department: Payer: Medicare Other

## 2023-06-25 ENCOUNTER — Encounter: Payer: Self-pay | Admitting: Internal Medicine

## 2023-06-25 DIAGNOSIS — S22030A Wedge compression fracture of third thoracic vertebra, initial encounter for closed fracture: Secondary | ICD-10-CM | POA: Diagnosis not present

## 2023-06-25 DIAGNOSIS — W19XXXA Unspecified fall, initial encounter: Secondary | ICD-10-CM

## 2023-06-25 DIAGNOSIS — R55 Syncope and collapse: Secondary | ICD-10-CM | POA: Diagnosis not present

## 2023-06-25 DIAGNOSIS — N179 Acute kidney failure, unspecified: Secondary | ICD-10-CM | POA: Diagnosis not present

## 2023-06-25 DIAGNOSIS — Z85038 Personal history of other malignant neoplasm of large intestine: Secondary | ICD-10-CM

## 2023-06-25 LAB — ECHOCARDIOGRAM COMPLETE: Weight: 1936 oz

## 2023-06-25 LAB — CBC
HCT: 37.1 % — ABNORMAL LOW (ref 39.0–52.0)
Hemoglobin: 12.1 g/dL — ABNORMAL LOW (ref 13.0–17.0)
MCH: 34.4 pg — ABNORMAL HIGH (ref 26.0–34.0)
MCHC: 32.6 g/dL (ref 30.0–36.0)
MCV: 105.4 fL — ABNORMAL HIGH (ref 80.0–100.0)
Platelets: 203 10*3/uL (ref 150–400)
RBC: 3.52 MIL/uL — ABNORMAL LOW (ref 4.22–5.81)
RDW: 13.1 % (ref 11.5–15.5)
WBC: 3.9 10*3/uL — ABNORMAL LOW (ref 4.0–10.5)
nRBC: 0 % (ref 0.0–0.2)

## 2023-06-25 LAB — BASIC METABOLIC PANEL
Anion gap: 5 (ref 5–15)
BUN: 26 mg/dL — ABNORMAL HIGH (ref 8–23)
CO2: 23 mmol/L (ref 22–32)
Calcium: 8.7 mg/dL — ABNORMAL LOW (ref 8.9–10.3)
Chloride: 107 mmol/L (ref 98–111)
Creatinine, Ser: 1.81 mg/dL — ABNORMAL HIGH (ref 0.61–1.24)
GFR, Estimated: 38 mL/min — ABNORMAL LOW (ref >60)
Glucose, Bld: 94 mg/dL (ref 70–99)
Potassium: 4.3 mmol/L (ref 3.5–5.1)
Sodium: 135 mmol/L (ref 135–145)

## 2023-06-25 LAB — LACTIC ACID, PLASMA
Lactic Acid, Venous: 1.6 mmol/L (ref 0.5–1.9)
Lactic Acid, Venous: 3.9 mmol/L (ref 0.5–1.9)

## 2023-06-25 LAB — HEPATIC FUNCTION PANEL
ALT: 20 U/L (ref 0–44)
AST: 31 U/L (ref 15–41)
Albumin: 4.7 g/dL (ref 3.5–5.0)
Alkaline Phosphatase: 60 U/L (ref 38–126)
Bilirubin, Direct: 0.3 mg/dL — ABNORMAL HIGH (ref 0.0–0.2)
Indirect Bilirubin: 0.9 mg/dL (ref 0.3–0.9)
Total Bilirubin: 1.2 mg/dL (ref 0.3–1.2)
Total Protein: 9 g/dL — ABNORMAL HIGH (ref 6.5–8.1)

## 2023-06-25 LAB — URINALYSIS, ROUTINE W REFLEX MICROSCOPIC
Bilirubin Urine: NEGATIVE
Glucose, UA: NEGATIVE mg/dL
Hgb urine dipstick: NEGATIVE
Ketones, ur: 5 mg/dL — AB
Leukocytes,Ua: NEGATIVE
Nitrite: NEGATIVE
Protein, ur: NEGATIVE mg/dL
Specific Gravity, Urine: 1.026 (ref 1.005–1.030)
pH: 5 (ref 5.0–8.0)

## 2023-06-25 LAB — CK: Total CK: 97 U/L (ref 49–397)

## 2023-06-25 MED ORDER — ONDANSETRON HCL 4 MG/2ML IJ SOLN
4.0000 mg | Freq: Once | INTRAMUSCULAR | Status: AC
  Administered 2023-06-25: 4 mg via INTRAVENOUS
  Filled 2023-06-25: qty 2

## 2023-06-25 MED ORDER — HYDROCODONE-ACETAMINOPHEN 5-325 MG PO TABS
1.0000 | ORAL_TABLET | ORAL | Status: DC | PRN
  Administered 2023-06-25 – 2023-06-26 (×3): 1 via ORAL
  Filled 2023-06-25 (×3): qty 1

## 2023-06-25 MED ORDER — ATORVASTATIN CALCIUM 20 MG PO TABS
80.0000 mg | ORAL_TABLET | Freq: Every day | ORAL | Status: DC
  Administered 2023-06-25 – 2023-06-26 (×2): 80 mg via ORAL
  Filled 2023-06-25 (×2): qty 4

## 2023-06-25 MED ORDER — ACETAMINOPHEN 325 MG PO TABS: 650.0000 mg | ORAL_TABLET | Freq: Four times a day (QID) | ORAL | Status: DC | PRN

## 2023-06-25 MED ORDER — ISOSORBIDE MONONITRATE ER 60 MG PO TB24: 60.0000 mg | ORAL_TABLET | Freq: Every day | ORAL | Status: DC

## 2023-06-25 MED ORDER — MORPHINE SULFATE (PF) 4 MG/ML IV SOLN
4.0000 mg | Freq: Once | INTRAVENOUS | Status: AC
  Administered 2023-06-25: 4 mg via INTRAVENOUS
  Filled 2023-06-25: qty 1

## 2023-06-25 MED ORDER — ACETAMINOPHEN 650 MG RE SUPP: 650.0000 mg | Freq: Four times a day (QID) | RECTAL | Status: DC | PRN

## 2023-06-25 MED ORDER — METOPROLOL SUCCINATE ER 25 MG PO TB24
25.0000 mg | ORAL_TABLET | Freq: Every day | ORAL | Status: DC
  Administered 2023-06-25 – 2023-06-26 (×2): 25 mg via ORAL
  Filled 2023-06-25 (×2): qty 1

## 2023-06-25 MED ORDER — ASPIRIN 81 MG PO TBEC
81.0000 mg | DELAYED_RELEASE_TABLET | Freq: Every day | ORAL | Status: DC
  Administered 2023-06-25 – 2023-06-26 (×2): 81 mg via ORAL
  Filled 2023-06-25 (×2): qty 1

## 2023-06-25 MED ORDER — ATENOLOL 25 MG PO TABS: 50.0000 mg | ORAL_TABLET | Freq: Every day | ORAL | Status: DC

## 2023-06-25 MED ORDER — SODIUM CHLORIDE 0.9% FLUSH
3.0000 mL | Freq: Two times a day (BID) | INTRAVENOUS | Status: DC
  Administered 2023-06-25 – 2023-06-26 (×2): 3 mL via INTRAVENOUS

## 2023-06-25 MED ORDER — ENOXAPARIN SODIUM 30 MG/0.3ML IJ SOSY
30.0000 mg | PREFILLED_SYRINGE | INTRAMUSCULAR | Status: DC
  Administered 2023-06-25 – 2023-06-26 (×2): 30 mg via SUBCUTANEOUS
  Filled 2023-06-25 (×2): qty 0.3

## 2023-06-25 MED ORDER — SODIUM CHLORIDE 0.9 % IV SOLN: INTRAVENOUS | Status: AC

## 2023-06-25 MED ORDER — NITROGLYCERIN 0.4 MG SL SUBL: 0.4000 mg | SUBLINGUAL_TABLET | SUBLINGUAL | Status: DC | PRN

## 2023-06-25 MED ORDER — CLOPIDOGREL BISULFATE 75 MG PO TABS
75.0000 mg | ORAL_TABLET | Freq: Every day | ORAL | Status: DC
  Administered 2023-06-25 – 2023-06-26 (×2): 75 mg via ORAL
  Filled 2023-06-25 (×2): qty 1

## 2023-06-25 MED ORDER — ISOSORBIDE MONONITRATE ER 30 MG PO TB24
30.0000 mg | ORAL_TABLET | Freq: Every day | ORAL | Status: DC
  Administered 2023-06-25 – 2023-06-26 (×2): 30 mg via ORAL
  Filled 2023-06-25 (×2): qty 1

## 2023-06-25 MED ORDER — MORPHINE SULFATE (PF) 2 MG/ML IV SOLN
2.0000 mg | INTRAVENOUS | Status: DC | PRN
  Administered 2023-06-25: 2 mg via INTRAVENOUS
  Filled 2023-06-25: qty 1

## 2023-06-25 MED ORDER — SODIUM CHLORIDE 0.9 % IV BOLUS
1000.0000 mL | Freq: Once | INTRAVENOUS | Status: AC
  Administered 2023-06-25: 1000 mL via INTRAVENOUS

## 2023-06-25 NOTE — ED Notes (Signed)
Dr. Marcell Barlow at beside.

## 2023-06-25 NOTE — ED Provider Notes (Signed)
[facility] Provider Note    Event Date/Time   First MD Initiated Contact with Patient 06/24/23 2309     (approximate)   History   Loss of Consciousness (Patient is here for a syncopal episode 2 - 3 days ago; States that he was standing in his kitchen when this occurred and hit his chest on the cabinets and landed on his tail bone; Patient does have a cardiac history and c/o back pain from "bottom of my head to my tailbone")   HPI  Carl Townsend is a 77 y.o. male here with syncope and general fatigue.  The patient states that 3 days ago or so he was standing in the kitchen when he began to feel very lightheaded and weak.  He checked his blood pressure and it was in the 90s.  He tried to rest but had a syncopal episode.  He hit his chest during the syncopal episode.  Since then, he is felt generally fatigued.  He has had some lightheadedness with standing.  He states has been eating and drinking normally.  No fevers or chills.  States he just felt continued weakness so he presents for evaluation.  No recent med changes.     Physical Exam   Triage Vital Signs: ED Triage Vitals [06/24/23 1637]  Enc Vitals Group     BP 128/84     Pulse Rate (!) 109     Resp 19     Temp 98 F (36.7 C)     Temp Source Oral     SpO2 100 %     Weight 121 lb (54.9 kg)     Height 5\' 11"  (1.803 m)     Head Circumference      Peak Flow      Pain Score      Pain Loc      Pain Edu?      Excl. in GC?     Most recent vital signs: Vitals:   06/25/23 0600 06/25/23 0640  BP: 123/73   Pulse: (!) 112 (!) 106  Resp: 17 16  Temp:    SpO2: (!) 89% 93%     General: Awake, no distress.  CV:  Good peripheral perfusion.  Regular rate and rhythm. Resp:  Normal work of breathing.  Lungs clear. Abd:  No distention.  No tenderness. Other:  No focal neurological deficits.   ED Results / Procedures / Treatments   Labs (all labs ordered are listed, but only abnormal results  are displayed) Labs Reviewed  BASIC METABOLIC PANEL - Abnormal; Notable for the following components:      Result Value   Sodium 134 (*)    Glucose, Bld 119 (*)    BUN 37 (*)    Creatinine, Ser 2.58 (*)    GFR, Estimated 25 (*)    All other components within normal limits  CBC WITH DIFFERENTIAL/PLATELET - Abnormal; Notable for the following components:   WBC 3.4 (*)    RBC 3.70 (*)    Hemoglobin 12.8 (*)    HCT 38.5 (*)    MCV 104.1 (*)    MCH 34.6 (*)    All other components within normal limits  URINALYSIS, ROUTINE W REFLEX MICROSCOPIC - Abnormal; Notable for the following components:   Color, Urine YELLOW (*)    APPearance CLEAR (*)    Ketones, ur 5 (*)    All other components within normal limits  LACTIC ACID, PLASMA - Abnormal; Notable for  the following components:   Lactic Acid, Venous 3.9 (*)    All other components within normal limits  HEPATIC FUNCTION PANEL - Abnormal; Notable for the following components:   Total Protein 9.0 (*)    Bilirubin, Direct 0.3 (*)    All other components within normal limits  CK  LACTIC ACID, PLASMA  CBC  CREATININE, SERUM  TROPONIN I (HIGH SENSITIVITY)  TROPONIN I (HIGH SENSITIVITY)     EKG Sinus rhythm with premature supraventricular complexes.  Ventricular rate 95.  PR 140, QRS 70, QTc 442 for acute ST elevations repress medication for subacute ischemic infarct.   RADIOLOGY Chest x-ray: Clear CT head: Negative CT chest/onset pelvis:T3 compression fx, T2 nondisplaced fx  I also independently reviewed and agree with radiologist interpretations.   PROCEDURES:  Critical Care performed: No   MEDICATIONS ORDERED IN ED: Medications  aspirin EC tablet 81 mg (has no administration in time range)  atenolol (TENORMIN) tablet 50 mg (has no administration in time range)  atorvastatin (LIPITOR) tablet 80 mg (has no administration in time range)  nitroGLYCERIN (NITROSTAT) SL tablet 0.4 mg (has no administration in time range)   clopidogrel (PLAVIX) tablet 75 mg (has no administration in time range)  isosorbide mononitrate (IMDUR) 24 hr tablet 60 mg (has no administration in time range)  sodium chloride flush (NS) 0.9 % injection 3 mL (has no administration in time range)  enoxaparin (LOVENOX) injection 30 mg (has no administration in time range)  acetaminophen (TYLENOL) tablet 650 mg (has no administration in time range)    Or  acetaminophen (TYLENOL) suppository 650 mg (has no administration in time range)  HYDROcodone-acetaminophen (NORCO/VICODIN) 5-325 MG per tablet 1-2 tablet (has no administration in time range)  morphine (PF) 2 MG/ML injection 2 mg (2 mg Intravenous Given 06/25/23 0626)  0.9 %  sodium chloride infusion ( Intravenous New Bag/Given 06/25/23 0628)  sodium chloride 0.9 % bolus 1,000 mL (0 mLs Intravenous Stopped 06/25/23 0446)  sodium chloride 0.9 % bolus 1,000 mL (0 mLs Intravenous Stopped 06/25/23 0446)  morphine (PF) 4 MG/ML injection 4 mg (4 mg Intravenous Given 06/25/23 0243)  ondansetron (ZOFRAN) injection 4 mg (4 mg Intravenous Given 06/25/23 0243)     IMPRESSION / MDM / ASSESSMENT AND PLAN / ED COURSE  I reviewed the triage vital signs and the nursing notes.                              Differential diagnosis includes, but is not limited to, syncope: dehydration, arrhythmia, anemia, orthostasis, vasovagal; chest pain: MSK, thoracic injury, atypical ACS  Patient's presentation is most consistent with acute presentation with potential threat to life or bodily function.  The patient is on the cardiac monitor to evaluate for evidence of arrhythmia and/or significant heart rate changes  77 yo M here with generalized weakness, syncope, and chest pain. Re: syncope, suspect this was 2/2 orthostasis in setting of significant dehydration. His lab work shows significant AKI. IVF given. No focal neuro deficits. CT head is negative. Re: his CP - likely referred from T3 compression fx. No LE weakness or  signs of NV compromise. Will consult NSGY and admit for management of his AKI, dehydration, and T3 fx. Morphine given for pain. Of note, LA elevated but suspect this is 2/2 his dehydration/relative hypotension. No fevers or infectious sx at this time.    FINAL CLINICAL IMPRESSION(S) / ED DIAGNOSES   Final diagnoses:  Fall, initial  encounter  AKI (acute kidney injury) (HCC)  Closed wedge compression fracture of T3 vertebra, initial encounter (HCC)     Rx / DC Orders   ED Discharge Orders     None        Note:  This document was prepared using Dragon voice recognition software and may include unintentional dictation errors.   Shaune Pollack, MD 06/25/23 [phone]

## 2023-06-25 NOTE — ED Notes (Signed)
Pt sitting up in bed, watching television. No needs at this time. Pain assessed.

## 2023-06-25 NOTE — Assessment & Plan Note (Signed)
EKG nonacute Continue nitroglycerin, isosorbide, candesartan atenolol and aspirin

## 2023-06-25 NOTE — Consult Note (Signed)
[facility] CLINIC CARDIOLOGY CONSULT NOTE       Patient ID: Carl Townsend MRN: [id number] DOB/AGE: 77-Oct-1947 77 y.o.  Admit date: 06/24/2023 Referring Physician Dr. Lindajo Royal  Primary Physician  Dr. Marcello Fennel  Primary Cardiologist Dr. Juliann Pares Reason for Consultation syncope   HPI: Carl Townsend is a 77yoM with a PMH of CAD s/p IVUS guided, IABP supported PCI with DES x 1 ostial-prox LCX 01/12/2021, HFpEF (50-55%, inferior HK 12/2020), CKD III, colon cancer s/p colectomy (2005), hx tobacco use who presented to [facility] ED 06/24/2023 after a syncopal episode that occurred on 6/19. Cardiology is consulted for further assistance.   Patient states that last Wednesday evening, 6/19, he was feeling rather weak, lightheaded and dizzy and took his blood pressure which was 90/40.  He walked to the kitchen with intentions to get up into salt or eat some soup to increase his blood pressure, but ended up falling forward and hitting his chest on the counter, and wedging himself between a cabinet and the wall.  His wife was at home and found him, and he delayed presenting to the hospital until the afternoon of 6/25 with continued back pain.  Prior to his syncopal episode now a week ago, he had been feeling generally weak and occasionally lightheaded with position changes.  He denied chest pain, but notes occasional heart racing and palpitations, he has chronic lower extremity edema and numbness in his legs for which she is followed by vascular surgery.  Since his fall, he has not had much of an appetite and is historically "not a water drinker" and feels like he has not had much to eat or drink since this fall due to his continued back pain.  My time of evaluation this morning he is sitting at an angle in bed eating breakfast.  Currently denies any chest pain, lightheadedness or dizziness, shortness of breath, heart racing or palpitations.  We discussed his outpatient antihypertensives - atenolol 50 mg at  night and candesartan 16 mg daily and admits to actually only taking his atenolol 2-3 times weekly.   Recent vitals notable for a blood pressure of 130/79, he is in sinus rhythm to sinus tachycardia on telemetry with rates in the 90s to 120s.  He is comfortable on room air saturating well.  Labs notable for initial BUN/creatinine 37/2.58 and GFR 25 with much improvement after IV hydration to BUN/creatinine 26/1.81 and GFR of 38.  High-sensitivity troponin was negative at 8, 9.  CK also negative at 97.  He has chronic macrocytic anemia with H&H 12.1/37.1 MCV 105.4, platelets 203.'s chest x-ray without active cardiopulmonary disease.  CT chest abdomen pelvis did not reveal an acute compression fracture of T3 with less than 25% vertebral body height loss, 1 to 2 mm of retropulsion of the superior endplate without significant spinal canal narrowing.  There was advanced emphysema, coronary artery and aortic atherosclerotic calcifications, without acute abnormality in the abdomen or pelvis, an indeterminate 2.6 cm density lesion within the left inguinal canal, and enlarged prostate.  History of tobacco use but no current smoking.  Drinks typically 1 glass of wine nightly.  Review of systems complete and found to be negative unless listed above     Past Medical History:  Diagnosis Date   Arthritis    neck and shoulders   Chronic kidney disease    chronic kidney disease - stage 3, GFR 30-59 ml/min   Colon cancer [facility])    ED (erectile dysfunction)    History  of 2019 novel coronavirus disease (COVID-19) 08/23/2020   Hyperlipidemia    Hypertension    Inguinal hernia    NSTEMI (non-ST elevated myocardial infarction) (HCC)    Pre-diabetes    Tubular adenoma of colon     Past Surgical History:  Procedure Laterality Date   COLON SURGERY Right 05/06   colectomy and sigmoid colectomy    COLONOSCOPY WITH PROPOFOL N/A 03/13/2016   Procedure: COLONOSCOPY WITH PROPOFOL;  Surgeon: Christena Deem, MD;   Location[facility] ENDOSCOPY;  Service: Endoscopy;  Laterality: N/A;   COLONOSCOPY WITH PROPOFOL N/A 03/09/2020   Procedure: COLONOSCOPY WITH PROPOFOL;  Surgeon: Toledo, Boykin Nearing, MD;  Location: [facility] ENDOSCOPY;  Service: Gastroenterology;  Laterality: N/A;   CORONARY STENT INTERVENTION N/A 01/12/2021   Procedure: CORONARY STENT INTERVENTION;  Surgeon: Lyn Records, MD;  Location: [facility] INVASIVE CV LAB;  Service: Cardiovascular;  Laterality: N/A;   CORONARY ULTRASOUND/IVUS N/A 01/12/2021   Procedure: Intravascular Ultrasound/IVUS;  Surgeon: Lyn Records, MD;  Location: [facility] INVASIVE CV LAB;  Service: Cardiovascular;  Laterality: N/A;   EYE SURGERY Left    vitreous retinal surgery   IABP INSERTION N/A 01/12/2021   Procedure: IABP Insertion;  Surgeon: Alwyn Pea, MD;  Location: [facility] INVASIVE CV LAB;  Service: Cardiovascular;  Laterality: N/A;   LEFT HEART CATH AND CORONARY ANGIOGRAPHY N/A 01/12/2021   Procedure: LEFT HEART CATH AND CORONARY ANGIOGRAPHY possible PCI and stent;  Surgeon: Alwyn Pea, MD;  Location: [facility] INVASIVE CV LAB;  Service: Cardiovascular;  Laterality: N/A;   right foot surgery     XI ROBOTIC ASSISTED INGUINAL HERNIA REPAIR WITH MESH Bilateral 11/10/2020   Procedure: XI ROBOTIC ASSISTED INGUINAL HERNIA REPAIR WITH MESH;  Surgeon: Sung Amabile, DO;  Location: [facility] ORS;  Service: General;  Laterality: Bilateral;    (Not in a hospital admission)  Social History   Socioeconomic History   Marital status: Married    Spouse name: Not on file   Number of children: Not on file   Years of education: Not on file   Highest education level: Not on file  Occupational History   Not on file  Tobacco Use   Smoking status: Former    Types: Cigarettes    Quit date: 2000    Years since quitting: 24.4   Smokeless tobacco: Never  Vaping Use   Vaping Use: Never used  Substance and Sexual Activity   Alcohol use: Yes    Comment: glass of wine "once in a while"    Drug use: No    Sexual activity: Not on file  Other Topics Concern   Not on file  Social History Narrative   Not on file   Social Determinants of Health   Financial Resource Strain: Not on file  Food Insecurity: No Food Insecurity (06/25/2023)   Hunger Vital Sign    Worried About Running Out of Food in the Last Year: Never true    Ran Out of Food in the Last Year: Never true  Transportation Needs: No Transportation Needs (06/25/2023)   PRAPARE - Administrator, Civil Service (Medical): No    Lack of Transportation (Non-Medical): No  Physical Activity: Not on file  Stress: Not on file  Social Connections: Not on file  Intimate Partner Violence: Not At Risk (06/25/2023)   Humiliation, Afraid, Rape, and Kick questionnaire    Fear of Current or Ex-Partner: No    Emotionally Abused: No    Physically Abused: No  Sexually Abused: No    Family History  Problem Relation Age of Onset   Heart failure Mother    Heart attack Father    Heart attack Brother        Multiple MI's; died in his 16s   Heart disease Brother        Multiple PCI's first in their 73s      Intake/Output Summary (Last 24 hours) at 06/25/2023 0847 Last data filed at 06/25/2023 0446 Gross per 24 hour  Intake 2000 ml  Output --  Net 2000 ml    Vitals:   06/25/23 0400 06/25/23 0600 06/25/23 0640 06/25/23 0700  BP: (!) 153/81 123/73  112/70  Pulse: (!) 123 (!) 112 (!) 106 (!) 105  Resp: (!) 26 17 16 15   Temp:      TempSrc:      SpO2: 91% (!) 89% 93% 91%  Weight:      Height:        PHYSICAL EXAM General: Pleasant thin black male,  in no acute distress.  Laying inclined in ED stretcher, eating breakfast. HEENT:  Normocephalic and atraumatic. Neck:  No JVD.  Lungs: Normal respiratory effort on room air.  Decreased breath sounds without appreciable crackles or wheezes.   Heart: Tachycardic but regular. Normal S1 and S2 without gallops or murmurs.  Abdomen: Non-distended appearing.  Msk: Normal strength and  tone for age. Extremities: Warm and well perfused. No clubbing, cyanosis.  No peripheral edema.  Neuro: Alert and oriented X 3. Psych:  Answers questions appropriately.   Labs: Basic Metabolic Panel: Recent Labs    06/24/23 1635  NA 134*  K 3.9  CL 99  CO2 24  GLUCOSE 119*  BUN 37*  CREATININE 2.58*  CALCIUM 9.5   Liver Function Tests: Recent Labs    06/24/23 2354  AST 31  ALT 20  ALKPHOS 60  BILITOT 1.2  PROT 9.0*  ALBUMIN 4.7   No results for input(s): "LIPASE", "AMYLASE" in the last 72 hours. CBC: Recent Labs    06/24/23 1635  WBC 3.4*  NEUTROABS 1.8  HGB 12.8*  HCT 38.5*  MCV 104.1*  PLT 213   Cardiac Enzymes: Recent Labs    06/24/23 1635 06/24/23 1919  CKTOTAL  --  97  TROPONINIHS 8 9   BNP: No results for input(s): "BNP" in the last 72 hours. D-Dimer: No results for input(s): "DDIMER" in the last 72 hours. Hemoglobin A1C: No results for input(s): "HGBA1C" in the last 72 hours. Fasting Lipid Panel: No results for input(s): "CHOL", "HDL", "LDLCALC", "TRIG", "CHOLHDL", "LDLDIRECT" in the last 72 hours. Thyroid Function Tests: No results for input(s): "TSH", "T4TOTAL", "T3FREE", "THYROIDAB" in the last 72 hours.  Invalid input(s): "FREET3" Anemia Panel: No results for input(s): "VITAMINB12", "FOLATE", "FERRITIN", "TIBC", "IRON", "RETICCTPCT" in the last 72 hours.   Radiology: CT T-SPINE NO CHARGE  Result Date: 06/25/2023 CLINICAL DATA:  Syncopal episode 2-3 days ago. Larey Seat and hit chest on cabinet and landed on tailbone. Back pain. Sepsis. EXAM: CT THORACIC SPINE WITHOUT CONTRAST TECHNIQUE: Multidetector CT images of the thoracic were obtained using the standard protocol without intravenous contrast. RADIATION DOSE REDUCTION: This exam was performed according to the departmental dose-optimization program which includes automated exposure control, adjustment of the mA and/or kV according to patient size and/or use of iterative reconstruction  technique. COMPARISON:  Same day CT chest abdomen and pelvis. FINDINGS: Alignment: Normal. Vertebrae: Redemonstrated acute compression fracture of T3 with 2 mm of retropulsion of the  superior endplate. Additional nondisplaced fracture through the anterior inferior osteophytes at T2. Paraspinal and other soft tissues: Negative. See separate report for findings in the chest. Disc levels: Mild multilevel spondylosis. There are bridging anterior osteophytes from T2-T7. No significant spinal canal narrowing. IMPRESSION: 1. Redemonstrated acute compression fracture of T3 with 2 mm of retropulsion of the superior endplate. No significant spinal canal narrowing. 2. Additional nondisplaced fracture through the anterior inferior osteophytes at T2. Electronically Signed   By: Minerva Fester M.D.   On: 06/25/2023 03:25   CT CHEST ABDOMEN PELVIS WO CONTRAST  Result Date: 06/25/2023 CLINICAL DATA:  Syncopal episode 2-3 days ago. Larey Seat and hit chest on cabinet and landed on tailbone. Back pain. Sepsis. EXAM: CT CHEST, ABDOMEN AND PELVIS WITHOUT CONTRAST TECHNIQUE: Multidetector CT imaging of the chest, abdomen and pelvis was performed following the standard protocol without IV contrast. RADIATION DOSE REDUCTION: This exam was performed according to the departmental dose-optimization program which includes automated exposure control, adjustment of the mA and/or kV according to patient size and/or use of iterative reconstruction technique. COMPARISON:  CT abdomen and pelvis 09/14/2011 and CT chest 06/24/2023 FINDINGS: CT CHEST FINDINGS Cardiovascular: Normal heart size. Coronary artery and aortic atherosclerotic calcification. No pericardial effusion. Mediastinum/Nodes: No thoracic adenopathy. Trachea and esophagus are unremarkable. Lungs/Pleura: Advanced emphysema. No focal consolidation, pleural effusion, or pneumothorax. Musculoskeletal: Superior endplate compression fracture with less than 25% vertebral body height loss of  T3 has an acute appearance. 1-2 mm of retropulsion of the superior endplate. No significant spinal canal narrowing. CT ABDOMEN PELVIS FINDINGS Hepatobiliary: Unremarkable gallbladder and biliary tree. Unremarkable noncontrast appearance of the liver. Pancreas: Unremarkable. Spleen: Unremarkable Adrenals/Urinary Tract: Normal adrenal glands. No urinary calculi or hydronephrosis. Unremarkable bladder. Stomach/Bowel: Normal caliber large and small bowel. Colonic diverticulosis without diverticulitis. Normal appendix. Stomach is within normal limits. Vascular/Lymphatic: Aortic atherosclerosis. No enlarged abdominal or pelvic lymph nodes. Reproductive: Enlarged prostate. Other: Within the left inguinal canal there is a 2.6 cm intermediate density round lesion (series 3/image 73). No free intraperitoneal fluid or air. Musculoskeletal: No acute fracture. IMPRESSION: 1. Acute compression fracture of T3 with less than 25% vertebral body height loss. 1-2 mm of retropulsion of the superior endplate. No significant spinal canal narrowing. 2. No acute abnormality in the abdomen or pelvis. 3. Indeterminate 2.6 cm intermediate density lesion within the left inguinal canal. This is indeterminate but may represent a retracted testicle. If desired a ultrasound may be helpful for further evaluation. 4. Enlarged prostate. Aortic Atherosclerosis (ICD10-I70.0). Electronically Signed   By: Minerva Fester M.D.   On: 06/25/2023 01:45   CT HEAD WO CONTRAST ( )  Result Date: 06/25/2023 CLINICAL DATA:  Syncope/presyncope EXAM: CT HEAD WITHOUT CONTRAST TECHNIQUE: Contiguous axial images were obtained from the base of the skull through the vertex without intravenous contrast. RADIATION DOSE REDUCTION: This exam was performed according to the departmental dose-optimization program which includes automated exposure control, adjustment of the mA and/or kV according to patient size and/or use of iterative reconstruction technique. COMPARISON:   11/03/2022 FINDINGS: Brain: No intracranial hemorrhage, mass effect, or evidence of acute infarct. No hydrocephalus. No extra-axial fluid collection. Generalized cerebral atrophy. Ill-defined hypoattenuation within the cerebral white matter is nonspecific but consistent with chronic small vessel ischemic disease. Vascular: No hyperdense vessel. Intracranial arterial calcification. Skull: No fracture or focal lesion. Sinuses/Orbits: No acute finding. Paranasal sinuses and mastoid air cells are well aerated. Other: None. IMPRESSION: 1. No evidence of acute intracranial abnormality. 2. 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 Electronically Signed   By: Minerva Fester M.D.   On: 06/25/2023 00:31   DG Chest 2 View  Result Date: 06/24/2023 CLINICAL DATA:  Syncope. EXAM: CHEST - 2 VIEW COMPARISON:  Chest radiograph dated 11/03/2022. FINDINGS: No focal consolidation, pleural effusion, or pneumothorax. The cardiac silhouette is within normal limits. Coronary vascular calcification or stent. Atherosclerotic calcification of the aorta. No acute osseous pathology. Degenerative changes of the spine IMPRESSION: No active cardiopulmonary disease. Electronically Signed   By: Elgie Collard M.D.   On: 06/24/2023 17:18    Treadmill myoview 03/06/2022 Impression   Borderline myocardial perfusion scan evidence of lateral inferior wall defect borderline redistribution mostly fixed mild reduced left ventricular function around 44% with mild lateral wall hypokinesis.   Intermediate risk scan  ECHO 01/11/2021   1. Inferior Hypokinesis.   2. Left ventricular ejection fraction, by estimation, is 50 to 55%. The  left ventricle has low normal function. The left ventricle demonstrates  regional wall motion abnormalities (see scoring diagram/findings for  description). Left ventricular diastolic   parameters were normal.   3. Right ventricular systolic function is normal. The right ventricular  size is  normal.   4. The mitral valve is grossly normal. No evidence of mitral valve  regurgitation.   5. The aortic valve is grossly normal. Aortic valve regurgitation is not  visualized.   01/12/2021 Stent intervention, [place] - Dr. Verdis Prime  Successful stent implantation in the ostial to proximal circumflex reducing a 99% stenosis with TIMI grade II flow to less than 10% with TIMI grade III flow and complete resolution of what it been ongoing chest pain.  A 4.0 x 15 mm Onyx was placed and postdilated with a 4.0 mm balloon to 14 and 15 atm respectively.   RECOMMENDATIONS:   Discontinue IV heparin. Intra-aortic balloon pump to 1:2 for 1 hour hour then 1:3 for 1 hour and discontinue the balloon pump.  We will also need to remove the left femoral sheath. Aspirin and Brilinta x6 months.  May switch to aspirin and Plavix after 6 months.  After total of 12 months of DAPT, discontinue aspirin and continue clopidogrel monotherapy.  [facility] - Dr. Julieta Bellini LAD to Prox LAD lesion is 10% stenosed. Mid LAD to Dist LAD lesion is 75% stenosed. Ost Cx lesion is 100% stenosed. Dist RCA lesion is 75% stenosed with 75% stenosed side branch in RPDA. RPAV lesion is 75% stenosed with 75% stenosed side branch in 2nd RPL. Preserved overall left ventricular function around 55% Intra aortic balloon pump placed for stabilization and refractory angina Anticipate transfer to tertiary care center for high risk complex PCI stent to circumflex   Conclusion Inpatient diagnostic cardiac cath because of refractory angina non-STEMI Left ventricle appears to be normal EF around 55% Coronary Left main was large but ectatic LAD ectatic proximally but diffusely diseased mid to distal (75% Circumflex 100% ostially with sluggish flow TIMI I IRA RCA large free of disease until PL and PDA diffusely diseased with 75% Intervention deferred Intra-aortic balloon pump placed for refractory angina 3 complex PCI to help with  stabilization Transferred patient to [facility] for high risk complex PCI    TELEMETRY reviewed by me (LT) 06/25/2023 : Sinus rhythm to sinus tach rate high 90s to 120s  EKG reviewed by me: Sinus rhythm with LVH, rate 95 bpm without acute ST or T wave changes  Data reviewed by me (LT) 06/25/2023: Last outpatient cardiology note, ED note,  admission H&P, neurosurgery note last 24h vitals tele labs imaging I/O   Principal Problem:   Acute renal failure superimposed on stage 3a chronic kidney disease (HCC) Active Problems:   Essential hypertension   CAD S/P RCA stent 12/2020   Traumatic closed wedge compression fracture of T3 vertebra (HCC)   Syncope and collapse 06/22/23   History of colon cancer 2006    ASSESSMENT AND PLAN:  Carl Karvonen. Townsend is a 39yoM with a PMH of CAD s/p IVUS guided, IABP supported PCI with DES x 1 ostial-prox LCX 01/12/2021, HFpEF (50-55%, inferior HK 12/2020), CKD III, colon cancer s/p colectomy (2005), hx tobacco use who presented to [facility] ED 06/24/2023 after a syncopal episode that occurred on 6/19. Cardiology is consulted for further assistance.   # Syncope # T3 compression fracture Reported preceding lightheadedness and dizziness with hypotension, BP checked at home as low as 90/40 before his fall on 6/19.  Since his fall, he has had ongoing back pain and poor appetite.  CT scan demonstrated an acute T3 compression fracture, evaluated by neurosurgery who recommends pain control, and outpatient PT/OT without a brace, and outpatient follow-up in 4 weeks.  On telemetry, there have been no pathologic arrhythmias or evidence of heart block as an etiology for his syncopal episode. -Agree with current therapy per primary team -Will adjust antihypertensives to hold candesartan 16 mg daily with plans to restart once his renal function improves, and change atenolol 50 mg nightly to metoprolol XL 25 mg daily. -Decrease Imdur from 60 mg daily to 30 mg.  He should not take this on  days he takes prn PDE-5 inhibitors -Echo complete -Can place a Holter monitor prior to discharge  # AKI on CKD 3 Baseline creatinine 1.5-1.8.  Up to 2.58 and GFR of 25 on admission.  # CAD without chest pain Chest pain-free.  Troponins negative.  Continue aspirin, clopidogrel, and atorvastatin  # Chronic HFpEF Euvolemic on exam following IV fluids.  -Metoprolol as above for GDMT, hold ARB for now with goals to restart as renal function and BP allow, possibly on an outpatient basis. -Repeat echo complete as above.  This patient's plan of care was discussed and created with Dr. Juliann Pares and he is in agreement.  Signed: Rebeca Allegra , PA-C 06/25/2023, 8:47 AM [facility] Cardiology

## 2023-06-25 NOTE — Assessment & Plan Note (Signed)
-   Continue home meds °

## 2023-06-25 NOTE — Progress Notes (Signed)
  INTERVAL PROGRESS NOTE    Carl Townsend- 77 y.o. male  LOS: 0 __________________________________________________________________  SUBJECTIVE: Admitted 06/24/2023 with cc of  Chief Complaint  Patient presents with   Loss of Consciousness    Patient is here for a syncopal episode 2 - 3 days ago; States that he was standing in his kitchen when this occurred and hit his chest on the cabinets and landed on his tail bone; Patient does have a cardiac history and c/o back pain from "bottom of my head to my tailbone"   Since admission, assessed by neurosurgery and cardiology.  OBJECTIVE: Blood pressure 112/70, pulse (!) 105, temperature 97.8 F (36.6 C), temperature source Oral, resp. rate 15, height 5\' 11"  (1.803 m), weight 54.9 kg, SpO2 91 %.   ASSESSMENT/PLAN:  I have reviewed the full H&P by Dr. Para March, and I agree with the assessment and plan as outlined therein. In addition:  Vertebral fracture: neurosurgery recommends pain management for T3 compression fracture which is stable and not requiring bracing or intervention currently. - can participate with PT/OT  Syncope: no reoccurrence. Cardiology suspects hypovolemia/orthostatic picture with hypotension. Recommends holding candesartan (especially in setting of AKI). Also changed home atenolol to metoprolol and decreased home Imdur from 60 to 30mg  daily.  Plan to place holter monitor prior to discharge.   Remains inpatient for continuous telemetry monitoring and AKI management.  - avoid nephrotoxic agents. - monitor BMP. May improve with less tighter control of BP. Appears to be hypoperfused in acute setting.  - echo read pending   Principal Problem:   Acute renal failure superimposed on stage 3a chronic kidney disease (HCC) Active Problems:   Syncope and collapse 06/22/23   Traumatic closed wedge compression fracture of T3 vertebra [facility])   Essential hypertension   CAD S/P RCA stent 12/2020   History of colon cancer  2006   Leeroy Bock, DO [facility] 06/25/2023, 8:36 AM    [email or web address] Available by Epic secure chat 7AM-7PM. If 7PM-7AM, please contact night-coverage   No Charge

## 2023-06-25 NOTE — Assessment & Plan Note (Signed)
Prerenal Received a bolus in the ED Continue IV hydration, monitor renal function and avoid nephrotoxins

## 2023-06-25 NOTE — H&P (Addendum)
History and Physical    Patient: Carl Townsend WUJ:[id number] DOB: [birth year] DOA: 06/24/2023 DOS: the patient was seen and examined on 06/25/2023 PCP: Barbette Reichmann, MD  Patient coming from: Home  Chief Complaint:  Chief Complaint  Patient presents with   Loss of Consciousness    Patient is here for a syncopal episode 2 - 3 days ago; States that he was standing in his kitchen when this occurred and hit his chest on the cabinets and landed on his tail bone; Patient does have a cardiac history and c/o back pain from "bottom of my head to my tailbone"    HPI: Carl Townsend is a 77 y.o. male with medical history significant for HTN, history of colon cancer 2006 treated surgically, CKD 3A,  , CAD s/p  stent x2 last seen by cardiology April 2024, with history of falls, who presents to the ED for evaluation of weakness and back pain following what appears to be a syncopal event 2 days prior.  Patient states he was standing in his kitchen when he did feeling very lightheaded and fell hitting his chest against the cabinet landing on his tailbone.  Prior to the fall he had checked his blood pressure and the systolic was in the 90s.  Since the fall he has had continued weakness and presented to the emergency room. ED course and data review: Tachycardic to 109 on arrival with otherwise normal vitals. Labs notable for creatinine 2.58 up from baseline of 2, mild neutropenia of 3.4, anemia 12.8 with normal platelets.  Troponin 9, CK 97Lactic acid hepatic function panel normal.  It elevated at 3.9.  Urinalysis notable for 5 ketones. EKG, personally viewed and interpreted showing sinus at 95 with nonspecific ST-T wave changes. CT chest abdomen and pelvis without contrast and CT head done and they were nonacute except for a T3 compression fracture and osteophyte fracture of T2 as follows. IMPRESSION: 1. Redemonstrated acute compression fracture of T3 with 2 mm of retropulsion of the superior  endplate. No significant spinal canal narrowing. 2. Additional nondisplaced fracture through the anterior inferior osteophytes at T2.  Patient treated with morphine and an NS bolus Hospitalist consulted for admission.   Review of Systems: As mentioned in the history of present illness. All other systems reviewed and are negative.  Past Medical History:  Diagnosis Date   Arthritis    neck and shoulders   Chronic kidney disease    chronic kidney disease - stage 3, GFR 30-59 ml/min   Colon cancer [facility])    ED (erectile dysfunction)    History of 2019 novel coronavirus disease (COVID-19) 08/23/2020   Hyperlipidemia    Hypertension    Inguinal hernia    NSTEMI (non-ST elevated myocardial infarction) (HCC)    Pre-diabetes    Tubular adenoma of colon    Past Surgical History:  Procedure Laterality Date   COLON SURGERY Right 05/06   colectomy and sigmoid colectomy    COLONOSCOPY WITH PROPOFOL N/A 03/13/2016   Procedure: COLONOSCOPY WITH PROPOFOL;  Surgeon: Christena Deem, MD;  Location: [facility] ENDOSCOPY;  Service: Endoscopy;  Laterality: N/A;   COLONOSCOPY WITH PROPOFOL N/A 03/09/2020   Procedure: COLONOSCOPY WITH PROPOFOL;  Surgeon: Toledo, Boykin Nearing, MD;  Location: [facility] ENDOSCOPY;  Service: Gastroenterology;  Laterality: N/A;   CORONARY STENT INTERVENTION N/A 01/12/2021   Procedure: CORONARY STENT INTERVENTION;  Surgeon: Lyn Records, MD;  Location: [facility] INVASIVE CV LAB;  Service: Cardiovascular;  Laterality: N/A;   CORONARY ULTRASOUND/IVUS N/A 01/12/2021  Procedure: Intravascular Ultrasound/IVUS;  Surgeon: Lyn Records, MD;  Location: [facility] INVASIVE CV LAB;  Service: Cardiovascular;  Laterality: N/A;   EYE SURGERY Left    vitreous retinal surgery   IABP INSERTION N/A 01/12/2021   Procedure: IABP Insertion;  Surgeon: Alwyn Pea, MD;  Location: [facility] INVASIVE CV LAB;  Service: Cardiovascular;  Laterality: N/A;   LEFT HEART CATH AND CORONARY ANGIOGRAPHY N/A 01/12/2021   Procedure:  LEFT HEART CATH AND CORONARY ANGIOGRAPHY possible PCI and stent;  Surgeon: Alwyn Pea, MD;  Location: [facility] INVASIVE CV LAB;  Service: Cardiovascular;  Laterality: N/A;   right foot surgery     XI ROBOTIC ASSISTED INGUINAL HERNIA REPAIR WITH MESH Bilateral 11/10/2020   Procedure: XI ROBOTIC ASSISTED INGUINAL HERNIA REPAIR WITH MESH;  Surgeon: Sung Amabile, DO;  Location: [facility] ORS;  Service: General;  Laterality: Bilateral;   Social History:  reports that he quit smoking about 24 years ago. His smoking use included cigarettes. He has never used smokeless tobacco. He reports current alcohol use. He reports that he does not use drugs.  No Known Allergies  Family History  Problem Relation Age of Onset   Heart failure Mother    Heart attack Father    Heart attack Brother        Multiple MI's; died in his 63s   Heart disease Brother        Multiple PCI's first in their 88s    Prior to Admission medications   Medication Sig Start Date End Date Taking? Authorizing Provider  ascorbic acid (VITAMIN C) 500 MG tablet Take 500 mg by mouth daily.    [provider]  aspirin 81 MG tablet Take 81 mg by mouth daily.    [provider]  atenolol (TENORMIN) 50 MG tablet Take 50 mg by mouth at bedtime.    [provider]  atorvastatin (LIPITOR) 80 MG tablet TAKE 1 TABLET BY MOUTH EVERYDAY AT BEDTIME 04/25/22   Allred, Fayrene Fearing, MD  candesartan (ATACAND) 16 MG tablet Take 16 mg by mouth daily. 12/16/20   [provider]  Carboxymethylcellulose Sodium (REFRESH LIQUIGEL) 1 % GEL Place 1 drop into both eyes at bedtime.     [provider]  clopidogrel (PLAVIX) 75 MG tablet Take 75 mg by mouth daily. 04/26/22   [provider]  cyanocobalamin 1000 MCG tablet Take 1,000 mcg by mouth daily.    [provider]  ergocalciferol (VITAMIN D2) 1.25 MG (50000 UT) capsule Take 50,000 Units by mouth once a week.    [provider]   Glycerin-Polysorbate 80 (REFRESH DRY EYE THERAPY OP) Place 1 drop into both eyes daily as needed (dry eye).    [provider]  iron polysaccharides (NIFEREX) 150 MG capsule Take 150 mg by mouth daily. 08/07/22 08/07/23  [provider]  isosorbide mononitrate (IMDUR) 60 MG 24 hr tablet Take 1 tablet (60 mg total) by mouth daily. 02/18/23   Meriam Sprague, MD  Multiple Vitamin (MULTIVITAMIN) tablet Take 1 tablet by mouth daily.    [provider]  nitroGLYCERIN (NITROSTAT) 0.4 MG SL tablet Place 1 tablet (0.4 mg total) under the tongue every 5 (five) minutes as needed. 01/13/21   Bhagat, Sharrell Ku, PA  pantoprazole (PROTONIX) 40 MG tablet Take 40 mg by mouth daily. 04/26/22   [provider]  sildenafil (REVATIO) 20 MG tablet Take 20 mg by mouth as needed. 08/07/22   [provider]  zinc gluconate 50 MG tablet Take 50  mg by mouth daily.    [provider]    Physical Exam: Vitals:   06/25/23 0103 06/25/23 0145 06/25/23 0147 06/25/23 0200  BP:   (!) 143/91 137/83  Pulse: 87 (!) 101  100  Resp: 16 18  16   Temp:      TempSrc:      SpO2: 99% 100%  98%  Weight:      Height:       Physical Exam Vitals and nursing note reviewed.  Constitutional:      General: He is not in acute distress.    Comments: Frail-appearing male  HENT:     Head: Normocephalic and atraumatic.  Cardiovascular:     Rate and Rhythm: Regular rhythm. Tachycardia present.     Heart sounds: Normal heart sounds.  Pulmonary:     Effort: Pulmonary effort is normal.     Breath sounds: Normal breath sounds.  Abdominal:     Palpations: Abdomen is soft.     Tenderness: There is no abdominal tenderness.  Neurological:     Mental Status: Mental status is at baseline.     Labs on Admission: I have personally reviewed following labs and imaging studies  CBC: Recent Labs  Lab 06/24/23 1635  WBC 3.4*  NEUTROABS 1.8  HGB 12.8*  HCT 38.5*  MCV 104.1*  PLT 213    Basic Metabolic Panel: Recent Labs  Lab 06/24/23 1635  NA 134*  K 3.9  CL 99  CO2 24  GLUCOSE 119*  BUN 37*  CREATININE 2.58*  CALCIUM 9.5   GFR: Estimated Creatinine Clearance: 18.9 mL/min (A) (by C-G formula based on SCr of 2.58 mg/dL (H)). Liver Function Tests: Recent Labs  Lab 06/24/23 2354  AST 31  ALT 20  ALKPHOS 60  BILITOT 1.2  PROT 9.0*  ALBUMIN 4.7   No results for input(s): "LIPASE", "AMYLASE" in the last 168 hours. No results for input(s): "AMMONIA" in the last 168 hours. Coagulation Profile: No results for input(s): "INR", "PROTIME" in the last 168 hours. Cardiac Enzymes: Recent Labs  Lab 06/24/23 1919  CKTOTAL 97   BNP (last 3 results) No results for input(s): "PROBNP" in the last 8760 hours. HbA1C: No results for input(s): "HGBA1C" in the last 72 hours. CBG: No results for input(s): "GLUCAP" in the last 168 hours. Lipid Profile: No results for input(s): "CHOL", "HDL", "LDLCALC", "TRIG", "CHOLHDL", "LDLDIRECT" in the last 72 hours. Thyroid Function Tests: No results for input(s): "TSH", "T4TOTAL", "FREET4", "T3FREE", "THYROIDAB" in the last 72 hours. Anemia Panel: No results for input(s): "VITAMINB12", "FOLATE", "FERRITIN", "TIBC", "IRON", "RETICCTPCT" in the last 72 hours. Urine analysis:    Component Value Date/Time   COLORURINE YELLOW (A) 06/24/2023 2354   APPEARANCEUR CLEAR (A) 06/24/2023 2354   LABSPEC 1.026 06/24/2023 2354   PHURINE 5.0 06/24/2023 2354   GLUCOSEU NEGATIVE 06/24/2023 2354   HGBUR NEGATIVE 06/24/2023 2354   BILIRUBINUR NEGATIVE 06/24/2023 2354   KETONESUR 5 (A) 06/24/2023 2354   PROTEINUR NEGATIVE 06/24/2023 2354   NITRITE NEGATIVE 06/24/2023 2354   LEUKOCYTESUR NEGATIVE 06/24/2023 2354    Radiological Exams on Admission: CT T-SPINE NO CHARGE  Result Date: 06/25/2023 CLINICAL DATA:  Syncopal episode 2-3 days ago. Larey Seat and hit chest on cabinet and landed on tailbone. Back pain. Sepsis. EXAM: CT THORACIC SPINE  WITHOUT CONTRAST TECHNIQUE: Multidetector CT images of the thoracic were obtained using the standard protocol without intravenous contrast. RADIATION DOSE REDUCTION: This exam was performed according to the departmental dose-optimization program which  includes automated exposure control, adjustment of the mA and/or kV according to patient size and/or use of iterative reconstruction technique. COMPARISON:  Same day CT chest abdomen and pelvis. FINDINGS: Alignment: Normal. Vertebrae: Redemonstrated acute compression fracture of T3 with 2 mm of retropulsion of the superior endplate. Additional nondisplaced fracture through the anterior inferior osteophytes at T2. Paraspinal and other soft tissues: Negative. See separate report for findings in the chest. Disc levels: Mild multilevel spondylosis. There are bridging anterior osteophytes from T2-T7. No significant spinal canal narrowing. IMPRESSION: 1. Redemonstrated acute compression fracture of T3 with 2 mm of retropulsion of the superior endplate. No significant spinal canal narrowing. 2. Additional nondisplaced fracture through the anterior inferior osteophytes at T2. Electronically Signed   By: Minerva Fester M.D.   On: 06/25/2023 03:25   CT CHEST ABDOMEN PELVIS WO CONTRAST  Result Date: 06/25/2023 CLINICAL DATA:  Syncopal episode 2-3 days ago. Larey Seat and hit chest on cabinet and landed on tailbone. Back pain. Sepsis. EXAM: CT CHEST, ABDOMEN AND PELVIS WITHOUT CONTRAST TECHNIQUE: Multidetector CT imaging of the chest, abdomen and pelvis was performed following the standard protocol without IV contrast. RADIATION DOSE REDUCTION: This exam was performed according to the departmental dose-optimization program which includes automated exposure control, adjustment of the mA and/or kV according to patient size and/or use of iterative reconstruction technique. COMPARISON:  CT abdomen and pelvis 09/14/2011 and CT chest 06/24/2023 FINDINGS: CT CHEST FINDINGS Cardiovascular:  Normal heart size. Coronary artery and aortic atherosclerotic calcification. No pericardial effusion. Mediastinum/Nodes: No thoracic adenopathy. Trachea and esophagus are unremarkable. Lungs/Pleura: Advanced emphysema. No focal consolidation, pleural effusion, or pneumothorax. Musculoskeletal: Superior endplate compression fracture with less than 25% vertebral body height loss of T3 has an acute appearance. 1-2 mm of retropulsion of the superior endplate. No significant spinal canal narrowing. CT ABDOMEN PELVIS FINDINGS Hepatobiliary: Unremarkable gallbladder and biliary tree. Unremarkable noncontrast appearance of the liver. Pancreas: Unremarkable. Spleen: Unremarkable Adrenals/Urinary Tract: Normal adrenal glands. No urinary calculi or hydronephrosis. Unremarkable bladder. Stomach/Bowel: Normal caliber large and small bowel. Colonic diverticulosis without diverticulitis. Normal appendix. Stomach is within normal limits. Vascular/Lymphatic: Aortic atherosclerosis. No enlarged abdominal or pelvic lymph nodes. Reproductive: Enlarged prostate. Other: Within the left inguinal canal there is a 2.6 cm intermediate density round lesion (series 3/image 73). No free intraperitoneal fluid or air. Musculoskeletal: No acute fracture. IMPRESSION: 1. Acute compression fracture of T3 with less than 25% vertebral body height loss. 1-2 mm of retropulsion of the superior endplate. No significant spinal canal narrowing. 2. No acute abnormality in the abdomen or pelvis. 3. Indeterminate 2.6 cm intermediate density lesion within the left inguinal canal. This is indeterminate but may represent a retracted testicle. If desired a ultrasound may be helpful for further evaluation. 4. Enlarged prostate. Aortic Atherosclerosis (ICD10-I70.0). Electronically Signed   By: Minerva Fester M.D.   On: 06/25/2023 01:45   CT HEAD WO CONTRAST ( )  Result Date: 06/25/2023 CLINICAL DATA:  Syncope/presyncope EXAM: CT HEAD WITHOUT CONTRAST  TECHNIQUE: Contiguous axial images were obtained from the base of the skull through the vertex without intravenous contrast. RADIATION DOSE REDUCTION: This exam was performed according to the departmental dose-optimization program which includes automated exposure control, adjustment of the mA and/or kV according to patient size and/or use of iterative reconstruction technique. COMPARISON:  11/03/2022 FINDINGS: Brain: No intracranial hemorrhage, mass effect, or evidence of acute infarct. No hydrocephalus. No extra-axial fluid collection. Generalized cerebral atrophy. Ill-defined hypoattenuation within the cerebral white matter is nonspecific but consistent with chronic small  vessel ischemic disease. Vascular: No hyperdense vessel. Intracranial arterial calcification. Skull: No fracture or focal lesion. Sinuses/Orbits: No acute finding. Paranasal sinuses and mastoid air cells are well aerated. Other: None. IMPRESSION: 1. No evidence of acute intracranial abnormality. 2. 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 Electronically Signed   By: Minerva Fester M.D.   On: 06/25/2023 00:31   DG Chest 2 View  Result Date: 06/24/2023 CLINICAL DATA:  Syncope. EXAM: CHEST - 2 VIEW COMPARISON:  Chest radiograph dated 11/03/2022. FINDINGS: No focal consolidation, pleural effusion, or pneumothorax. The cardiac silhouette is within normal limits. Coronary vascular calcification or stent. Atherosclerotic calcification of the aorta. No acute osseous pathology. Degenerative changes of the spine IMPRESSION: No active cardiopulmonary disease. Electronically Signed   By: Elgie Collard M.D.   On: 06/24/2023 17:18     Data Reviewed: Relevant notes from primary care and specialist visits, past discharge summaries as available in EHR, including Care Everywhere. Prior diagnostic testing as pertinent to current admission diagnoses Updated medications and problem lists for reconciliation ED course, including  vitals, labs, imaging, treatment and response to treatment Triage notes, nursing and pharmacy notes and ED provider's notes Notable results as noted in HPI   Assessment and Plan: * Acute renal failure superimposed on stage 3a chronic kidney disease (HCC) Prerenal Received a bolus in the ED Continue IV hydration, monitor renal function and avoid nephrotoxins  Syncope and collapse 06/22/23 History of falls Syncope workup with continuous cardiac monitoring, echo and carotid Doppler CT head unrevealing PT eval Cardiology consult  Traumatic closed wedge compression fracture of T3 vertebra (HCC) Patient fell 2 days prior and has been walking around but continues to have back pain CT: acute compression fracture of T3 with 2 mm of retropulsion of the superior endplate. No significant spinal canal narrowing. Additional nondisplaced fracture through the anterior inferior osteophytes at T2. Pain control Neurosurgery consult for recommendations  History of colon cancer 2006 CT chest abdomen pelvis showed no acute abnormality in the abdomen or pelvis. Patient gets schedule colonoscopy  CAD S/P RCA stent 12/2020 EKG nonacute Continue nitroglycerin, isosorbide, candesartan atenolol and aspirin  Essential hypertension Continue home meds     DVT prophylaxis: Lovenox  Consults: n [facility] cardiology, neurosurgery  Advance Care Planning:   Code Status: Prior   Family Communication: none  Disposition Plan: Back to previous home environment  Severity of Illness: The appropriate patient status for this patient is OBSERVATION. Observation status is judged to be reasonable and necessary in order to provide the required intensity of service to ensure the patient's safety. The patient's presenting symptoms, physical exam findings, and initial radiographic and laboratory data in the context of their medical condition is felt to place them at decreased risk for further clinical deterioration.  Furthermore, it is anticipated that the patient will be medically stable for discharge from the hospital within 2 midnights of admission.   Author: Andris Baumann, MD 06/25/2023 4:45 AM  For on call review [email or web address].

## 2023-06-25 NOTE — Assessment & Plan Note (Addendum)
Patient fell 2 days prior and has been walking around but continues to have back pain CT: acute compression fracture of T3 with 2 mm of retropulsion of the superior endplate. No significant spinal canal narrowing. Additional nondisplaced fracture through the anterior inferior osteophytes at T2. Pain control Neurosurgery consult for recommendations

## 2023-06-25 NOTE — Progress Notes (Signed)
*  PRELIMINARY RESULTS* Echocardiogram 2D Echocardiogram has been performed.  Carl Townsend 06/25/2023, 1:35 PM

## 2023-06-25 NOTE — ED Notes (Signed)
Pt eating lunch tray. Wife at bedside  

## 2023-06-25 NOTE — Assessment & Plan Note (Addendum)
History of falls Syncope workup with continuous cardiac monitoring, echo and carotid Doppler CT head unrevealing PT eval Cardiology consult

## 2023-06-25 NOTE — Consult Note (Signed)
Consult requested by:  Dr. Para March  Consult requested for:  T3 compression fracture  Primary Physician:  Barbette Reichmann, MD  History of Present Illness: 06/25/2023 Carl Townsend is here today with a chief complaint of fall in his kitchen.  Based on his description, it appears that he has symptoms of syncope and hit his chest on his kitchen [place] a couple of days ago.  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  He has been feeling very fatigued and had lightheadedness with standing.  He presented to the emergency department because of these issues.  During workup, possible T3 compression fracture was noted.  I was consulted for management recommendations.  He denies weakness in his arms or legs.  He has no substantial change in his symptoms of neuropathy.  The symptoms are causing a significant impact on the patient's life.   I have utilized the care everywhere function in epic to review the outside records available from external health systems.  Review of Systems:  A 10 point review of systems is negative, except for the pertinent positives and negatives detailed in the HPI.  Past Medical History: Past Medical History:  Diagnosis Date   Arthritis    neck and shoulders   Chronic kidney disease    chronic kidney disease - stage 3, GFR 30-59 ml/min   Colon cancer [facility])    ED (erectile dysfunction)    History of 2019 novel coronavirus disease (COVID-19) 08/23/2020   Hyperlipidemia    Hypertension    Inguinal hernia    NSTEMI (non-ST elevated myocardial infarction) (HCC)    Pre-diabetes    Tubular adenoma of colon     Past Surgical History: Past Surgical History:  Procedure Laterality Date   COLON SURGERY Right 05/06   colectomy and sigmoid colectomy    COLONOSCOPY WITH PROPOFOL N/A 03/13/2016   Procedure: COLONOSCOPY WITH PROPOFOL;  Surgeon: Christena Deem, MD;  Location: [facility]  ENDOSCOPY;  Service: Endoscopy;  Laterality: N/A;   COLONOSCOPY WITH PROPOFOL N/A 03/09/2020   Procedure: COLONOSCOPY WITH PROPOFOL;  Surgeon: Toledo, Boykin Nearing, MD;  Location: [facility] ENDOSCOPY;  Service: Gastroenterology;  Laterality: N/A;   CORONARY STENT INTERVENTION N/A 01/12/2021   Procedure: CORONARY STENT INTERVENTION;  Surgeon: Lyn Records, MD;  Location: [facility] INVASIVE CV LAB;  Service: Cardiovascular;  Laterality: N/A;   CORONARY ULTRASOUND/IVUS N/A 01/12/2021   Procedure: Intravascular Ultrasound/IVUS;  Surgeon: Lyn Records, MD;  Location: [facility] INVASIVE CV LAB;  Service: Cardiovascular;  Laterality: N/A;   EYE SURGERY Left    vitreous retinal surgery   IABP INSERTION N/A 01/12/2021   Procedure: IABP Insertion;  Surgeon: Alwyn Pea, MD;  Location: [facility] INVASIVE CV LAB;  Service: Cardiovascular;  Laterality: N/A;   LEFT HEART CATH AND CORONARY ANGIOGRAPHY N/A 01/12/2021   Procedure: LEFT HEART CATH AND CORONARY ANGIOGRAPHY possible PCI and stent;  Surgeon: Alwyn Pea, MD;  Location: [facility] INVASIVE CV LAB;  Service: Cardiovascular;  Laterality: N/A;   right foot surgery     XI ROBOTIC ASSISTED INGUINAL HERNIA REPAIR WITH MESH Bilateral 11/10/2020   Procedure: XI ROBOTIC ASSISTED INGUINAL HERNIA REPAIR WITH MESH;  Surgeon: Sung Amabile, DO;  Location: [facility] ORS;  Service: General;  Laterality: Bilateral;    Allergies: Allergies as of 06/24/2023   (No Known Allergies)    Medications: No outpatient medications have been marked as taking for the  06/24/23 encounter [facility] Encounter).    Social History: Social History   Tobacco Use   Smoking status: Former    Types: Cigarettes    Quit date: 2000    Years since quitting: 24.4   Smokeless tobacco: Never  Vaping Use   Vaping Use: Never used  Substance Use Topics   Alcohol use: Yes    Comment: glass of wine "once in a while"    Drug use: No    Family Medical History: Family History  Problem Relation Age of Onset    Heart failure Mother    Heart attack Father    Heart attack Brother        Multiple MI's; died in his 52s   Heart disease Brother        Multiple PCI's first in their 76s    Physical Examination: Vitals:   06/25/23 0640 06/25/23 0700  BP:  112/70  Pulse: (!) 106 (!) 105  Resp: 16 15  Temp:    SpO2: 93% 91%    General: Patient is in no apparent distress. Attention to examination is appropriate.  Neck:   Supple.  Slightly limited range of motion.  He has tenderness to palpation in his upper cervical spine as well as his upper thoracic spine  Respiratory: Patient is breathing without any difficulty.   NEUROLOGICAL:     Awake, alert, oriented to person, place, and time.  Speech is clear and fluent.  Cranial Nerves: Pupils equal round and reactive to light.  Facial tone is symmetric.  Facial sensation is symmetric. Shoulder shrug is symmetric. Tongue protrusion is midline.  There is no pronator drift.  Strength: Side Biceps Triceps Deltoid Interossei Grip Wrist Ext. Wrist Flex.  R 5 5 5 5 5 5 5   L 5 5 5 5 5 5 5    Side Iliopsoas Quads Hamstring PF DF EHL  R 5 5 5 5 5 5   L 5 5 5 5 5 5    Hoffman's is absent.   Bilateral upper and lower extremity sensation is intact to light touch.    No evidence of dysmetria noted.  Gait is untested.     Medical Decision Making  Imaging: T-spine CT on June 25, 2023 IMPRESSION: 1. Redemonstrated acute compression fracture of T3 with 2 mm of retropulsion of the superior endplate. No significant spinal canal narrowing. 2. Additional nondisplaced fracture through the anterior inferior osteophytes at T2.     Electronically Signed   By: Minerva Fester M.D.   On: 06/25/2023 03:25  I have personally reviewed the images and agree with the above interpretation.  Assessment and Plan: [name] is a pleasant 77 y.o. male with possible T3 compression fracture that is likely acute based on his pain presentation, though his  mechanism of injury would not typically cause a spinal fracture of this type.  This is a stable fracture and does not require bracing.  I would recommend pain control with medications.  Once he has been cleared medically, he is fine to proceed with physical and Occupational Therapy without a brace.  I will arrange follow-up for him in approximately 4 weeks   I have communicated my recommendations to the requesting physician and coordinated care to facilitate these recommendations.     Liliyana Thobe K. Myer Haff MD, [facility] Neurosurgery

## 2023-06-25 NOTE — ED Notes (Signed)
Holter monitor being placed at this time.

## 2023-06-25 NOTE — Assessment & Plan Note (Signed)
CT chest abdomen pelvis showed no acute abnormality in the abdomen or pelvis. Patient gets schedule colonoscopy

## 2023-06-26 DIAGNOSIS — N179 Acute kidney failure, unspecified: Secondary | ICD-10-CM

## 2023-06-26 DIAGNOSIS — N1831 Chronic kidney disease, stage 3a: Secondary | ICD-10-CM | POA: Diagnosis not present

## 2023-06-26 LAB — CBC
HCT: 37.6 % — ABNORMAL LOW (ref 39.0–52.0)
Hemoglobin: 12.2 g/dL — ABNORMAL LOW (ref 13.0–17.0)
MCH: 34 pg (ref 26.0–34.0)
MCHC: 32.4 g/dL (ref 30.0–36.0)
MCV: 104.7 fL — ABNORMAL HIGH (ref 80.0–100.0)
Platelets: 201 10*3/uL (ref 150–400)
RBC: 3.59 MIL/uL — ABNORMAL LOW (ref 4.22–5.81)
RDW: 12.7 % (ref 11.5–15.5)
WBC: 3.3 10*3/uL — ABNORMAL LOW (ref 4.0–10.5)
nRBC: 0 % (ref 0.0–0.2)

## 2023-06-26 LAB — BASIC METABOLIC PANEL
Anion gap: 7 (ref 5–15)
BUN: 20 mg/dL (ref 8–23)
CO2: 22 mmol/L (ref 22–32)
Calcium: 8.9 mg/dL (ref 8.9–10.3)
Chloride: 103 mmol/L (ref 98–111)
Creatinine, Ser: 1.67 mg/dL — ABNORMAL HIGH (ref 0.61–1.24)
GFR, Estimated: 42 mL/min — ABNORMAL LOW (ref >60)
Glucose, Bld: 99 mg/dL (ref 70–99)
Potassium: 3.5 mmol/L (ref 3.5–5.1)
Sodium: 132 mmol/L — ABNORMAL LOW (ref 135–145)

## 2023-06-26 MED ORDER — HYDROCODONE-ACETAMINOPHEN 5-325 MG PO TABS: 1.0000 | ORAL_TABLET | Freq: Four times a day (QID) | ORAL | 0 refills | Status: AC | PRN

## 2023-06-26 MED ORDER — METHOCARBAMOL 500 MG PO TABS
500.0000 mg | ORAL_TABLET | Freq: Three times a day (TID) | ORAL | Status: DC
  Administered 2023-06-26 (×2): 500 mg via ORAL
  Filled 2023-06-26 (×2): qty 1

## 2023-06-26 MED ORDER — ENOXAPARIN SODIUM 40 MG/0.4ML IJ SOSY: 40.0000 mg | PREFILLED_SYRINGE | INTRAMUSCULAR | Status: DC

## 2023-06-26 MED ORDER — METHOCARBAMOL 500 MG PO TABS: 500.0000 mg | ORAL_TABLET | Freq: Three times a day (TID) | ORAL | 0 refills | Status: AC | PRN

## 2023-06-26 MED ORDER — METOPROLOL SUCCINATE ER 25 MG PO TB24: 25.0000 mg | ORAL_TABLET | Freq: Every day | ORAL | 0 refills | Status: AC

## 2023-06-26 MED ORDER — ISOSORBIDE MONONITRATE ER 30 MG PO TB24: 30.0000 mg | ORAL_TABLET | Freq: Every day | ORAL | 0 refills | Status: AC

## 2023-06-26 NOTE — Plan of Care (Signed)

## 2023-06-26 NOTE — Progress Notes (Signed)
Discharge instructions reviewed with patient including followup visits and new medications.  Understanding was verbalized and all questions were answered.  IV removed without complication; patient tolerated well.  Patient discharged home via wheelchair in stable condition escorted by nursing staff.  

## 2023-06-26 NOTE — TOC CM/SW Note (Signed)
Transition of Care [facility]) - Inpatient Brief Assessment   Patient Details  Name: Carl Townsend MRN: [id number] Date of Birth: [birth year]  Transition of [facility]) CM/SW Contact:    Chapman Fitch, RN Phone Number: 06/26/2023, 3:35 PM   Clinical Narrative:    Transition of Care Asessment: Insurance and Status: Insurance coverage has been reviewed Patient has primary care physician: Yes     Prior/Current Home Services: No current home services Social Determinants of Health Reivew: SDOH reviewed no interventions necessary Readmission risk has been reviewed: Yes Transition of care needs: no transition of care needs at this time

## 2023-06-26 NOTE — Discharge Summary (Signed)
Physician Discharge Summary  Carl Townsend:[id number] DOB: 11/24/1946 DOA: 06/24/2023  PCP: Barbette Reichmann, MD  Admit date: 06/24/2023 Discharge date: 06/26/2023  Admitted From: Home Disposition:  Home  Recommendations for Outpatient Follow-up:  Follow up with PCP in 1-2 weeks Follow-up with cardiology 2 weeks Follow-up with neurosurgery 4 weeks  Home Health: No Equipment/Devices: None  Discharge Condition: Stable CODE STATUS: Full Diet recommendation: Regular  Brief/Interim Summary: 77 y.o. male with medical history significant for HTN, history of colon cancer 2006 treated surgically, CKD 3A,  , CAD s/p  stent x2 last seen by cardiology April 2024, with history of falls, who presents to the ED for evaluation of weakness and back pain following what appears to be a syncopal event 2 days prior.  Patient states he was standing in his kitchen when he did feeling very lightheaded and fell hitting his chest against the cabinet landing on his tailbone.  Prior to the fall he had checked his blood pressure and the systolic was in the 90s.  Since the fall he has had continued weakness and presented to the emergency room. ED course and data review: Tachycardic to 109 on arrival with otherwise normal vitals. Labs notable for creatinine 2.58 up from baseline of 2, mild neutropenia of 3.4, anemia 12.8 with normal platelets.  Troponin 9, CK 97Lactic acid hepatic function panel normal.  It elevated at 3.9.  Urinalysis notable for 5 ketones. EKG, personally viewed and interpreted showing sinus at 95 with nonspecific ST-T wave changes. CT chest abdomen and pelvis without contrast and CT head done and they were nonacute except for a T3 compression fracture and osteophyte fracture of T2      Discharge Diagnoses:  Principal Problem:   Acute renal failure superimposed on stage 3a chronic kidney disease (HCC) Active Problems:   Syncope and collapse 06/22/23   Traumatic closed wedge  compression fracture of T3 vertebra (HCC)   Essential hypertension   CAD S/P RCA stent 12/2020   History of colon cancer 2006  Seen in consultation by cardiology and neurosurgery.  Per neurosurgery nonsurgical intervention.  No specific bracing ordered.  Pain control.  Outpatient follow-up in 4 weeks.  Did well with physical therapy.  No follow-up recommended.  From cardiology standpoint echocardiogram was reviewed.  Normal.  Adjustments made to medications.  Atenolol stopped.  Replaced with metoprolol for rate control.  Candesartan stopped until outpatient evaluation.  Imdur dose reduced to 30 mg daily.  Stable for discharge.  Holter monitor placed prior to DC.  Discharge Instructions  Discharge Instructions     Diet - low sodium heart healthy   Complete by: As directed    Increase activity slowly   Complete by: As directed       Allergies as of 06/26/2023   No Known Allergies      Medication List     STOP taking these medications    atenolol 50 MG tablet Commonly known as: TENORMIN   candesartan 16 MG tablet Commonly known as: ATACAND       TAKE these medications    ascorbic acid 500 MG tablet Commonly known as: VITAMIN C Take 500 mg by mouth daily.   aspirin 81 MG tablet Take 81 mg by mouth daily.   atorvastatin 80 MG tablet Commonly known as: LIPITOR TAKE 1 TABLET BY MOUTH EVERYDAY AT BEDTIME   clopidogrel 75 MG tablet Commonly known as: PLAVIX Take 75 mg by mouth daily.   cyanocobalamin 1000 MCG tablet Take 1,000 mcg  by mouth daily.   ergocalciferol 1.25 MG (50000 UT) capsule Commonly known as: VITAMIN D2 Take 50,000 Units by mouth once a week.   HYDROcodone-acetaminophen 5-325 MG tablet Commonly known as: NORCO/VICODIN Take 1 tablet by mouth every 6 (six) hours as needed for severe pain.   iron polysaccharides 150 MG capsule Commonly known as: NIFEREX Take 150 mg by mouth daily.   isosorbide mononitrate 30 MG 24 hr tablet Commonly known as:  IMDUR Take 1 tablet (30 mg total) by mouth daily. Start taking on: June 27, 2023 What changed:  medication strength how much to take   methocarbamol 500 MG tablet Commonly known as: ROBAXIN Take 1 tablet (500 mg total) by mouth every 8 (eight) hours as needed for up to 14 days for muscle spasms.   metoprolol succinate 25 MG 24 hr tablet Commonly known as: TOPROL-XL Take 1 tablet (25 mg total) by mouth daily. Start taking on: June 27, 2023   multivitamin tablet Take 1 tablet by mouth daily.   nitroGLYCERIN 0.4 MG SL tablet Commonly known as: Nitrostat Place 1 tablet (0.4 mg total) under the tongue every 5 (five) minutes as needed.   pantoprazole 40 MG tablet Commonly known as: PROTONIX Take 40 mg by mouth daily.   REFRESH DRY EYE THERAPY OP Place 1 drop into both eyes daily as needed (dry eye).   Refresh Liquigel 1 % Gel Generic drug: Carboxymethylcellulose Sodium Place 1 drop into both eyes at bedtime.   sildenafil 20 MG tablet Commonly known as: REVATIO Take 20 mg by mouth as needed.   zinc gluconate 50 MG tablet Take 50 mg by mouth daily.        Follow-up Information     Alwyn Pea, MD. Go in 1 week(s).   Specialties: Cardiology, Internal Medicine Contact information: [address] [place] [address] [phone]         Venetia Night, MD Follow up.   Specialty: Neurosurgery Why: Dr. Lucienne Capers office will contact you for followup instructions Contact information: [address] [place] [address] [phone]         Barbette Reichmann, MD. Schedule an appointment as soon as possible for a visit in 1 week(s).   Specialty: Internal Medicine Contact information: [address] [place] [address] [phone]                No Known Allergies  Consultations: Cardiology Neurosurgery   Procedures/Studies: CT T-SPINE NO CHARGE  Result Date:  06/25/2023 CLINICAL DATA:  Syncopal episode 2-3 days ago. Larey Seat and hit chest on cabinet and landed on tailbone. Back pain. Sepsis. EXAM: CT THORACIC SPINE WITHOUT CONTRAST TECHNIQUE: Multidetector CT images of the thoracic were obtained using the standard protocol without intravenous contrast. RADIATION DOSE REDUCTION: This exam was performed according to the departmental dose-optimization program which includes automated exposure control, adjustment of the mA and/or kV according to patient size and/or use of iterative reconstruction technique. COMPARISON:  Same day CT chest abdomen and pelvis. FINDINGS: Alignment: Normal. Vertebrae: Redemonstrated acute compression fracture of T3 with 2 mm of retropulsion of the superior endplate. Additional nondisplaced fracture through the anterior inferior osteophytes at T2. Paraspinal and other soft tissues: Negative. See separate report for findings in the chest. Disc levels: Mild multilevel spondylosis. There are bridging anterior osteophytes from T2-T7. No significant spinal canal narrowing. IMPRESSION: 1. Redemonstrated acute compression fracture of T3 with 2 mm of retropulsion of the superior endplate. No significant spinal  canal narrowing. 2. Additional nondisplaced fracture through the anterior inferior osteophytes at T2. Electronically Signed   By: Minerva Fester M.D.   On: 06/25/2023 03:25   CT CHEST ABDOMEN PELVIS WO CONTRAST  Result Date: 06/25/2023 CLINICAL DATA:  Syncopal episode 2-3 days ago. Larey Seat and hit chest on cabinet and landed on tailbone. Back pain. Sepsis. EXAM: CT CHEST, ABDOMEN AND PELVIS WITHOUT CONTRAST TECHNIQUE: Multidetector CT imaging of the chest, abdomen and pelvis was performed following the standard protocol without IV contrast. RADIATION DOSE REDUCTION: This exam was performed according to the departmental dose-optimization program which includes automated exposure control, adjustment of the mA and/or kV according to patient size and/or  use of iterative reconstruction technique. COMPARISON:  CT abdomen and pelvis 09/14/2011 and CT chest 06/24/2023 FINDINGS: CT CHEST FINDINGS Cardiovascular: Normal heart size. Coronary artery and aortic atherosclerotic calcification. No pericardial effusion. Mediastinum/Nodes: No thoracic adenopathy. Trachea and esophagus are unremarkable. Lungs/Pleura: Advanced emphysema. No focal consolidation, pleural effusion, or pneumothorax. Musculoskeletal: Superior endplate compression fracture with less than 25% vertebral body height loss of T3 has an acute appearance. 1-2 mm of retropulsion of the superior endplate. No significant spinal canal narrowing. CT ABDOMEN PELVIS FINDINGS Hepatobiliary: Unremarkable gallbladder and biliary tree. Unremarkable noncontrast appearance of the liver. Pancreas: Unremarkable. Spleen: Unremarkable Adrenals/Urinary Tract: Normal adrenal glands. No urinary calculi or hydronephrosis. Unremarkable bladder. Stomach/Bowel: Normal caliber large and small bowel. Colonic diverticulosis without diverticulitis. Normal appendix. Stomach is within normal limits. Vascular/Lymphatic: Aortic atherosclerosis. No enlarged abdominal or pelvic lymph nodes. Reproductive: Enlarged prostate. Other: Within the left inguinal canal there is a 2.6 cm intermediate density round lesion (series 3/image 73). No free intraperitoneal fluid or air. Musculoskeletal: No acute fracture. IMPRESSION: 1. Acute compression fracture of T3 with less than 25% vertebral body height loss. 1-2 mm of retropulsion of the superior endplate. No significant spinal canal narrowing. 2. No acute abnormality in the abdomen or pelvis. 3. Indeterminate 2.6 cm intermediate density lesion within the left inguinal canal. This is indeterminate but may represent a retracted testicle. If desired a ultrasound may be helpful for further evaluation. 4. Enlarged prostate. Aortic Atherosclerosis (ICD10-I70.0). Electronically Signed   By: Minerva Fester  M.D.   On: 06/25/2023 01:45   CT HEAD WO CONTRAST ( )  Result Date: 06/25/2023 CLINICAL DATA:  Syncope/presyncope EXAM: CT HEAD WITHOUT CONTRAST TECHNIQUE: Contiguous axial images were obtained from the base of the skull through the vertex without intravenous contrast. RADIATION DOSE REDUCTION: This exam was performed according to the departmental dose-optimization program which includes automated exposure control, adjustment of the mA and/or kV according to patient size and/or use of iterative reconstruction technique. COMPARISON:  11/03/2022 FINDINGS: Brain: No intracranial hemorrhage, mass effect, or evidence of acute infarct. No hydrocephalus. No extra-axial fluid collection. Generalized cerebral atrophy. Ill-defined hypoattenuation within the cerebral white matter is nonspecific but consistent with chronic small vessel ischemic disease. Vascular: No hyperdense vessel. Intracranial arterial calcification. Skull: No fracture or focal lesion. Sinuses/Orbits: No acute finding. Paranasal sinuses and mastoid air cells are well aerated. Other: None. IMPRESSION: 1. No evidence of acute intracranial abnormality. 2. 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 Electronically Signed   By: Minerva Fester M.D.   On: 06/25/2023 00:31   DG Chest 2 View  Result Date: 06/24/2023 CLINICAL DATA:  Syncope. EXAM: CHEST - 2 VIEW COMPARISON:  Chest radiograph dated 11/03/2022. FINDINGS: No focal consolidation, pleural effusion, or pneumothorax. The cardiac silhouette is within normal limits. Coronary vascular calcification or stent. Atherosclerotic calcification  of the aorta. No acute osseous pathology. Degenerative changes of the spine IMPRESSION: No active cardiopulmonary disease. Electronically Signed   By: Elgie Collard M.D.   On: 06/24/2023 17:18      Subjective: Seen and examined the day of discharge.  Stable no distress.  Appropriate for discharge home.  Discharge Exam: Vitals:   06/26/23  0954 06/26/23 1042  BP: (!) 94/57 115/73  Pulse: 97 74  Resp:    Temp:    SpO2:     Vitals:   06/26/23 0500 06/26/23 0746 06/26/23 0954 06/26/23 1042  BP:  128/76 (!) 94/57 115/73  Pulse:  88 97 74  Resp:  16    Temp:  98.7 F (37.1 C)    TempSrc:  Oral    SpO2:  97%    Weight: 58.7 kg     Height:        General: Pt is alert, awake, not in acute distress Cardiovascular: RRR, S1/S2 +, no rubs, no gallops Respiratory: CTA bilaterally, no wheezing, no rhonchi Abdominal: Soft, NT, ND, bowel sounds + Extremities: no edema, no cyanosis    The results of significant diagnostics from this hospitalization (including imaging, microbiology, ancillary and laboratory) are listed below for reference.     Microbiology: No results found for this or any previous visit (from the past 240 hour(s)).   Labs: BNP (last 3 results) No results for input(s): "BNP" in the last 8760 hours. Basic Metabolic Panel: Recent Labs  Lab 06/24/23 1635 06/25/23 0919 06/26/23 0509  NA 134* 135 132*  K 3.9 4.3 3.5  CL 99 107 103  CO2 24 23 22   GLUCOSE 119* 94 99  BUN 37* 26* 20  CREATININE 2.58* 1.81* 1.67*  CALCIUM 9.5 8.7* 8.9   Liver Function Tests: Recent Labs  Lab 06/24/23 2354  AST 31  ALT 20  ALKPHOS 60  BILITOT 1.2  PROT 9.0*  ALBUMIN 4.7   No results for input(s): "LIPASE", "AMYLASE" in the last 168 hours. No results for input(s): "AMMONIA" in the last 168 hours. CBC: Recent Labs  Lab 06/24/23 1635 06/25/23 0919 06/26/23 0509  WBC 3.4* 3.9* 3.3*  NEUTROABS 1.8  --   --   HGB 12.8* 12.1* 12.2*  HCT 38.5* 37.1* 37.6*  MCV 104.1* 105.4* 104.7*  PLT 213 203 201   Cardiac Enzymes: Recent Labs  Lab 06/24/23 1919  CKTOTAL 97   BNP: Invalid input(s): "POCBNP" CBG: No results for input(s): "GLUCAP" in the last 168 hours. D-Dimer No results for input(s): "DDIMER" in the last 72 hours. Hgb A1c No results for input(s): "HGBA1C" in the last 72 hours. Lipid Profile No  results for input(s): "CHOL", "HDL", "LDLCALC", "TRIG", "CHOLHDL", "LDLDIRECT" in the last 72 hours. Thyroid function studies No results for input(s): "TSH", "T4TOTAL", "T3FREE", "THYROIDAB" in the last 72 hours.  Invalid input(s): "FREET3" Anemia work up No results for input(s): "VITAMINB12", "FOLATE", "FERRITIN", "TIBC", "IRON", "RETICCTPCT" in the last 72 hours. Urinalysis    Component Value Date/Time   COLORURINE YELLOW (A) 06/24/2023 2354   APPEARANCEUR CLEAR (A) 06/24/2023 2354   LABSPEC 1.026 06/24/2023 2354   PHURINE 5.0 06/24/2023 2354   GLUCOSEU NEGATIVE 06/24/2023 2354   HGBUR NEGATIVE 06/24/2023 2354   BILIRUBINUR NEGATIVE 06/24/2023 2354   KETONESUR 5 (A) 06/24/2023 2354   PROTEINUR NEGATIVE 06/24/2023 2354   NITRITE NEGATIVE 06/24/2023 2354   LEUKOCYTESUR NEGATIVE 06/24/2023 2354   Sepsis Labs Recent Labs  Lab 06/24/23 1635 06/25/23 0919 06/26/23 0509  WBC 3.4*  3.9* 3.3*   Microbiology No results found for this or any previous visit (from the past 240 hour(s)).   Time coordinating discharge: Over 30 minutes  SIGNED:   Tresa Moore, MD  [facility] 06/26/2023, 1:48 PM Pager   If 7PM-7AM, please contact night-coverage

## 2023-06-26 NOTE — Progress Notes (Signed)
OT Cancellation Note  Patient Details Name: CAEDEN FOOTS MRN: [id number] DOB: Apr 26, 1946   Cancelled Treatment:    Reason Eval/Treat Not Completed: OT screened, no needs identified, will sign off (pt demonstrating ability to make figure four; has just walked with PT without AD; states he has been (I) with increased time for ADLs at home the past few days since his syncope and fall. No OT needs.)  Alvester Morin 06/26/2023, 11:12 AM

## 2023-06-26 NOTE — Care Management Obs Status (Signed)
MEDICARE OBSERVATION STATUS NOTIFICATION   Patient Details  Name: Carl Townsend MRN: [id number] Date of Birth: [birth year]   Medicare Observation Status Notification Given:  Yes    Chapman Fitch, RN 06/26/2023, 3:34 PM

## 2023-06-26 NOTE — Evaluation (Signed)
Physical Therapy Evaluation Patient Details Name: Carl Townsend MRN: [id number] DOB: Jul 10, 1946 Today's Date: 06/26/2023  History of Present Illness  Patient is a 77 year old presenting after syncopal episode that occurred on 6/19 with feelings of generalized weakness and occasionally lightheaded. CT scan demonstrated acute T3 compression fracture being treated without bracing.  Clinical Impression  Patient is cooperative throughout evaluation. He reports mild-moderate neck and upper back pain. He is independent with ambulation and mobility at baseline. He lives with his spouse.  Orthostatic vitals taken and no dizziness is reported with functional mobility. Educated patient on importance to monitor for pre-syncope signs at home and to take time with mobility transitions for safety and fall prevention. The patient ambulated the lap around nursing station and in the room with Min guard progressing to supervision with no assistive device. No immediate PT needs anticipated after this hospital stay. PT will continue to follow while in the hospital to to maximize independence.    Orthostatic vitals taken during PT evaluation:  BP- Lying Pulse- Lying BP- Sitting Pulse- Sitting BP- Standing at 0 minutes Pulse- Standing at 0 minutes BP- Standing 3 minutes Pulse- standing 3 minutes  06/26/23 1043 115/73 98 119/74 98 (!) 139/99 110 126/81 104       Recommendations for follow up therapy are one component of a multi-disciplinary discharge planning process, led by the attending physician.  Recommendations may be updated based on patient status, additional functional criteria and insurance authorization.  Follow Up Recommendations       Assistance Recommended at Discharge PRN  Patient can return home with the following  Help with stairs or ramp for entrance;Assist for transportation    Equipment Recommendations None recommended by PT  Recommendations for Other Services       Functional Status  Assessment Patient has had a recent decline in their functional status and demonstrates the ability to make significant improvements in function in a reasonable and predictable amount of time.     Precautions / Restrictions Precautions Precautions: Fall Precaution Comments: T3 fracture, recommend logroll for comfort Restrictions Weight Bearing Restrictions: No      Mobility  Bed Mobility Overal bed mobility: Modified Independent             General bed mobility comments: head of bed elevated and no use of bed rails. increased time with no physical assistance required    Transfers Overall transfer level: Needs assistance Equipment used: None Transfers: Sit to/from Stand Sit to Stand: Supervision           General transfer comment: supervision for safety. no loss of balance. no dizziness with mobility is reported. educated patient on importance of monitoring for signs of pre-syncope and to take time with mobility transitions for safety and fall prevention    Ambulation/Gait Ambulation/Gait assistance: Min guard, Supervision Gait Distance (Feet): 175 Feet Assistive device: None Gait Pattern/deviations: Wide base of support, Trunk flexed Gait velocity: decreased     General Gait Details: no loss of balance with ambulation around the hallway. heart rate 115bpm with ambulation. no dizziness reported  Information systems manager Rankin (Stroke Patients Only)       Balance Overall balance assessment: Needs assistance Sitting-balance support: Feet supported Sitting balance-Leahy Scale: Good     Standing balance support: No upper extremity supported Standing balance-Leahy Scale: Fair  Pertinent Vitals/Pain Pain Assessment Pain Assessment: Faces Faces Pain Scale: Hurts little more Pain Location: neck and upper back Pain Descriptors / Indicators: Discomfort Pain Intervention(s): Limited  activity within patient's tolerance, Monitored during session, Repositioned    Home Living Family/patient expects to be discharged to:: Private residence Living Arrangements: Spouse/significant other Available Help at Discharge: Family Type of Home: House Home Access: Stairs to enter Entrance Stairs-Rails: Lawyer of Steps: 2   Home Layout: One level        Prior Function Prior Level of Function : Independent/Modified Independent                     Hand Dominance        Extremity/Trunk Assessment   Upper Extremity Assessment Upper Extremity Assessment: Overall WFL for tasks assessed    Lower Extremity Assessment Lower Extremity Assessment: Overall WFL for tasks assessed (endurance impaired for sustained activity)    Cervical / Trunk Assessment Cervical / Trunk Assessment: Kyphotic  Communication   Communication: No difficulties  Cognition Arousal/Alertness: Awake/alert Behavior During Therapy: WFL for tasks assessed/performed Overall Cognitive Status: Within Functional Limits for tasks assessed                                          General Comments      Exercises     Assessment/Plan    PT Assessment Patient needs continued PT services  PT Problem List Decreased strength;Decreased activity tolerance;Decreased balance;Decreased mobility       PT Treatment Interventions DME instruction;Gait training;Stair training;Therapeutic activities;Functional mobility training;Therapeutic exercise;Balance training;Neuromuscular re-education;Patient/family education;Cognitive remediation    PT Goals (Current goals can be found in the Care Plan section)  Acute Rehab PT Goals Patient Stated Goal: to go home PT Goal Formulation: With patient Time For Goal Achievement: 07/10/23 Potential to Achieve Goals: Good    Frequency Min 2X/week     Co-evaluation               AM-PAC PT "6 Clicks" Mobility  Outcome  Measure Help needed turning from your back to your side while in a flat bed without using bedrails?: None Help needed moving from lying on your back to sitting on the side of a flat bed without using bedrails?: A Little Help needed moving to and from a bed to a chair (including a wheelchair)?: A Little Help needed standing up from a chair using your arms (e.g., wheelchair or bedside chair)?: A Little Help needed to walk in hospital room?: A Little Help needed climbing 3-5 steps with a railing? : A Little 6 Click Score: 19    End of Session   Activity Tolerance: Patient tolerated treatment well Patient left: in bed;with call bell/phone within reach;with bed alarm set Nurse Communication: Mobility status PT Visit Diagnosis: Other abnormalities of gait and mobility (R26.89);Muscle weakness (generalized) (M62.81)    Time: 0630-1601 PT Time Calculation (min) (ACUTE ONLY): 27 min   Charges:   PT Evaluation $PT Eval Low Complexity: 1 Low PT Treatments $Therapeutic Activity: 8-22 mins        Donna Bernard, PT, MPT   Ina Homes 06/26/2023, 11:46 AM

## 2023-06-27 LAB — ECHOCARDIOGRAM COMPLETE
AR max vel: 2.99 cm2
AV Area VTI: 3.88 cm2
AV Area mean vel: 2.61 cm2
AV Mean grad: 1.5 mmHg
AV Peak grad: 2.7 mmHg
Ao pk vel: 0.81 m/s
Area-P 1/2: 5.31 cm2
Height: 71 in
S' Lateral: 2.7 cm

## 2023-07-19 ENCOUNTER — Other Ambulatory Visit: Payer: Self-pay

## 2023-07-19 DIAGNOSIS — S22030A Wedge compression fracture of third thoracic vertebra, initial encounter for closed fracture: Secondary | ICD-10-CM

## 2023-07-23 ENCOUNTER — Ambulatory Visit
Admission: RE | Admit: 2023-07-23 | Discharge: 2023-07-23 | Disposition: A | Discharge status: Home / Self Care | Payer: Medicare Other | Source: Ambulatory Visit | Attending: Neurosurgery | Admitting: Neurosurgery

## 2023-07-23 ENCOUNTER — Encounter: Payer: Self-pay | Admitting: Neurosurgery

## 2023-07-23 ENCOUNTER — Ambulatory Visit
Admission: RE | Admit: 2023-07-23 | Discharge: 2023-07-23 | Disposition: A | Discharge status: Home / Self Care | Payer: Medicare Other | Attending: Neurosurgery | Admitting: Neurosurgery

## 2023-07-23 ENCOUNTER — Ambulatory Visit (INDEPENDENT_AMBULATORY_CARE_PROVIDER_SITE_OTHER): Payer: Medicare Other | Admitting: Neurosurgery

## 2023-07-23 VITALS — BP 116/72 | Wt 124.2 lb

## 2023-07-23 DIAGNOSIS — S22030A Wedge compression fracture of third thoracic vertebra, initial encounter for closed fracture: Secondary | ICD-10-CM | POA: Diagnosis not present

## 2023-07-23 DIAGNOSIS — S22030D Wedge compression fracture of third thoracic vertebra, subsequent encounter for fracture with routine healing: Secondary | ICD-10-CM | POA: Diagnosis not present

## 2023-07-23 DIAGNOSIS — W19XXXD Unspecified fall, subsequent encounter: Secondary | ICD-10-CM | POA: Diagnosis not present

## 2023-07-23 MED ORDER — METHOCARBAMOL 500 MG PO TABS: 500.0000 mg | ORAL_TABLET | Freq: Every day | ORAL | 0 refills | Status: DC | PRN

## 2023-07-23 MED ORDER — CELECOXIB 100 MG PO CAPS: 100.0000 mg | ORAL_CAPSULE | Freq: Every day | ORAL | 0 refills | Status: DC | PRN

## 2023-07-23 NOTE — Progress Notes (Signed)
Referring Physician:  Barbette Reichmann, MD [address] [facility] [place],  [place] [address]  Primary Physician:  Barbette Reichmann, MD   History of Present Illness: 07/23/2023 Carl Townsend is here today for hospital follow-up of T3 fracture.  While he states his pain has improved some he continues to have some stiffness in his neck and throughout his spine is worse with movement.  He is currently taking Tylenol and Robaxin which does provide some relief.  Overall he feels better than he did during his hospitalization.  He continues to deny any upper or lower extremity symptoms, or weakness.  Consult:  06/25/2023 Dr. Myer Haff Carl Townsend is here today with a chief complaint of fall in his kitchen.  Based on his description, it appears that he has symptoms of syncope and hit his chest on his kitchen [place] a couple of days ago.  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  He has been feeling very fatigued and had lightheadedness with standing.  He presented to the emergency department because of these issues.   During workup, possible T3 compression fracture was noted.  I was consulted for management recommendations.   He denies weakness in his arms or legs.  He has no substantial change in his symptoms of neuropathy.   The symptoms are causing a significant impact on the patient's life.    I have utilized the care everywhere function in epic to review the outside records available from external health systems.  Review of Systems:  A 10 point review of systems is negative, except for the pertinent positives and negatives detailed in the HPI.  Past Medical History: Past Medical History:  Diagnosis Date   Arthritis    neck and shoulders   Chronic kidney disease    chronic kidney disease - stage 3, GFR 30-59 ml/min   Colon cancer [facility])    ED (erectile dysfunction)     History of 2019 novel coronavirus disease (COVID-19) 08/23/2020   Hyperlipidemia    Hypertension    Inguinal hernia    NSTEMI (non-ST elevated myocardial infarction) (HCC)    Pre-diabetes    Tubular adenoma of colon     Past Surgical History: Past Surgical History:  Procedure Laterality Date   COLON SURGERY Right 05/06   colectomy and sigmoid colectomy    COLONOSCOPY WITH PROPOFOL N/A 03/13/2016   Procedure: COLONOSCOPY WITH PROPOFOL;  Surgeon: Christena Deem, MD;  [facility] END[facility]COPY;  Service: Endoscopy;  Laterality: N/A;   COLONOSCOPY WITH PROPOFOL N/A 03/09/2020   Procedure: COLONOSCOPY WITH PROPOFOL;  Surgeon: Toledo, Boykin Nearing, MD;  Location: [facility] ENDOSCOPY;  Service: Gastroenterology;  Laterality: N/A;   CORONARY STENT INTERVENTION N/A 01/12/2021   Procedure: CORONARY STENT INTERVENTION;  Surgeon: Lyn Records, MD;  Location: [facility] INVASIVE CV LAB;  Service: Cardiovascular;  Laterality: N/A;   CORONARY ULTRASOUND/IVUS N/A 01/12/2021   Procedure: Intravascular Ultrasound/IVUS;  Surgeon: Lyn Records, MD;  Location: [facility] INVASIVE CV LAB;  Service: Cardiovascular;  Laterality: N/A;   EYE SURGERY Left    vitreous retinal surgery   IABP INSERTION N/A 01/12/2021   Procedure: IABP Insertion;  Surgeon: Alwyn Pea, MD;  Location: [facility] INVASIVE CV LAB;  Service: Cardiovascular;  Laterality: N/A;   LEFT HEART CATH AND CORONARY ANGIOGRAPHY N/A 01/12/2021   Procedure: LEFT HEART CATH AND CORONARY ANGIOGRAPHY possible PCI and stent;  Surgeon:  Callwood, Dwayne D, MD;  Location: [facility] INVASIVE CV LAB;  Service: Cardiovascular;  Laterality: N/A;   right foot surgery     XI ROBOTIC ASSISTED INGUINAL HERNIA REPAIR WITH MESH Bilateral 11/10/2020   Procedure: XI ROBOTIC ASSISTED INGUINAL HERNIA REPAIR WITH MESH;  Surgeon: Sung Amabile, DO;  Location: [facility] ORS;  Service: General;  Laterality: Bilateral;    Allergies: Allergies as of 07/23/2023   (No Known Allergies)     Medications: Outpatient Encounter Medications as of 07/23/2023  Medication Sig   ascorbic acid (VITAMIN C) 500 MG tablet Take 500 mg by mouth daily.   aspirin 81 MG tablet Take 81 mg by mouth daily.   atorvastatin (LIPITOR) 80 MG tablet TAKE 1 TABLET BY MOUTH EVERYDAY AT BEDTIME   Carboxymethylcellulose Sodium (REFRESH LIQUIGEL) 1 % GEL Place 1 drop into both eyes at bedtime.    clopidogrel (PLAVIX) 75 MG tablet Take 75 mg by mouth daily.   cyanocobalamin 1000 MCG tablet Take 1,000 mcg by mouth daily.   ergocalciferol (VITAMIN D2) 1.25 MG (50000 UT) capsule Take 50,000 Units by mouth once a week.   Glycerin-Polysorbate 80 (REFRESH DRY EYE THERAPY OP) Place 1 drop into both eyes daily as needed (dry eye).   HYDROcodone-acetaminophen (NORCO/VICODIN) 5-325 MG tablet Take 1 tablet by mouth every 6 (six) hours as needed for severe pain.   iron polysaccharides (NIFEREX) 150 MG capsule Take 150 mg by mouth daily.   isosorbide mononitrate (IMDUR) 30 MG 24 hr tablet Take 1 tablet (30 mg total) by mouth daily.   metoprolol succinate (TOPROL-XL) 25 MG 24 hr tablet Take 1 tablet (25 mg total) by mouth daily.   Multiple Vitamin (MULTIVITAMIN) tablet Take 1 tablet by mouth daily.   nitroGLYCERIN (NITROSTAT) 0.4 MG SL tablet Place 1 tablet (0.4 mg total) under the tongue every 5 (five) minutes as needed.   pantoprazole (PROTONIX) 40 MG tablet Take 40 mg by mouth daily.   sildenafil (REVATIO) 20 MG tablet Take 20 mg by mouth as needed.   zinc gluconate 50 MG tablet Take 50 mg by mouth daily.   No facility-administered encounter medications on file as of 07/23/2023.    Social History: Social History   Tobacco Use   Smoking status: Former    Current packs/day: 0.00    Types: Cigarettes    Quit date: 2000    Years since quitting: 24.5   Smokeless tobacco: Never  Vaping Use   Vaping status: Never Used  Substance Use Topics   Alcohol use: Yes    Comment: glass of wine "once in a while"    Drug  use: No    Family Medical History: Family History  Problem Relation Age of Onset   Heart failure Mother    Heart attack Father    Heart attack Brother        Multiple MI's; died in his 83s   Heart disease Brother        Multiple PCI's first in their 60s    Physical Examination: Today's Vitals   07/23/23 1329  BP: 116/72  Weight: 56.3 kg  PainSc: 6   PainLoc: Back   Body mass index is 17.32 kg/m.   General: Patient is well developed, well nourished, calm, collected, and in no apparent distress. Attention to examination is appropriate.  Psychiatric: Patient is non-anxious.  Head:  Pupils equal, round, and reactive to light.  ENT:  Oral mucosa appears well hydrated.  Neck:   Supple.    Respiratory: Patient  is breathing without any difficulty.  Extremities: No edema.  Vascular: Palpable dorsal pedal pulses.  Skin:   On exposed skin, there are no abnormal skin lesions.  NEUROLOGICAL:     Awake, alert, oriented to person, place, and time.  Speech is clear and fluent. Fund of knowledge is appropriate.   Cranial Nerves: Pupils equal round and reactive to light.  Facial tone is symmetric.  Facial sensation is symmetric.  ROM of spine: limited throughout  Palpation of spine: non TTP   Strength: MAEW Gait slowed but steady  Medical Decision Making  Imaging: 07/23/23 thoracic xrays T3 fracture is not well visualized   Assessment and Plan: Mr. Warchol is a pleasant 77 y.o. male with T3 compression fracture.  He is overall improving despite some continued back pain.  We discussed that this is likely arthritic in nature in addition to his underlying fracture.  I encouraged him to continue with light exercise as tolerated and provided education regarding red flag symptoms exercise as tolerated. We discussed red flag symptoms. I provided him with a refill of his Robaxin but did educate him on side effects, the possible affected his blood pressure and recommended that he  not take it should he be hypotensive.  I also recommended that he take it at night before he goes to bed to reduce the risk of falls.  I have given him a small prescription to take only for severe pain of Celebrex and we discussed medication side effects.  I will see him back in 6 weeks with repeat thoracic x-rays or sooner should he have any questions or concerns.  He expressed understanding and was in agreement with this plan.  Thank you for involving me in the care of this patient.   I spent a total of 30 minutes in both face-to-face and non-face-to-face activities for this visit on the date of this encounter including review of imaging, review of documentation, discussion of symptoms, physical exam, order placement, and documentation.   [facility] Dept. of Neurosurgery

## 2023-08-15 ENCOUNTER — Other Ambulatory Visit: Payer: Self-pay | Admitting: Neurosurgery

## 2023-09-10 ENCOUNTER — Other Ambulatory Visit: Payer: Self-pay | Admitting: Family Medicine

## 2023-09-10 DIAGNOSIS — S22030A Wedge compression fracture of third thoracic vertebra, initial encounter for closed fracture: Secondary | ICD-10-CM

## 2023-09-19 ENCOUNTER — Ambulatory Visit (INDEPENDENT_AMBULATORY_CARE_PROVIDER_SITE_OTHER): Payer: Medicare Other | Admitting: Neurosurgery

## 2023-09-19 ENCOUNTER — Ambulatory Visit
Admission: RE | Admit: 2023-09-19 | Discharge: 2023-09-19 | Disposition: A | Discharge status: Home / Self Care | Payer: Medicare Other | Source: Ambulatory Visit | Attending: Neurosurgery | Admitting: Neurosurgery

## 2023-09-19 ENCOUNTER — Ambulatory Visit
Admission: RE | Admit: 2023-09-19 | Discharge: 2023-09-19 | Disposition: A | Discharge status: Home / Self Care | Payer: Medicare Other | Attending: Neurosurgery | Admitting: Neurosurgery

## 2023-09-19 DIAGNOSIS — S22030D Wedge compression fracture of third thoracic vertebra, subsequent encounter for fracture with routine healing: Secondary | ICD-10-CM

## 2023-09-19 DIAGNOSIS — S22030A Wedge compression fracture of third thoracic vertebra, initial encounter for closed fracture: Secondary | ICD-10-CM

## 2023-09-19 DIAGNOSIS — W19XXXD Unspecified fall, subsequent encounter: Secondary | ICD-10-CM

## 2023-09-19 NOTE — Progress Notes (Signed)
Referring Physician:  Barbette Reichmann, MD [address] [facility] [place],  [place] [address]  Primary Physician:  Carl Reichmann, MD   History of Present Illness: 09/19/23 [name] presents today for 6 week fracture follow up.  He is doing very well with some mild stiffness in his neck but is without pain.  He continues to be without any radicular symptoms, or weakness.  He has resumed the majority of his previous activities without any difficulty.   07/23/23 [name] is here today for hospital follow-up of T3 fracture.  While he states his pain has improved some he continues to have some stiffness in his neck and throughout his spine is worse with movement.  He is currently taking Tylenol and Robaxin which does provide some relief.  Overall he feels better than he did during his hospitalization.  He continues to deny any upper or lower extremity symptoms, or weakness.  Consult:  06/25/2023 Dr. Myer Townsend [name] is here today with a chief complaint of fall in his kitchen.  Based on his description, it appears that he has symptoms of syncope and hit his chest on his kitchen Carl Townsend a couple of days ago.  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  He has been feeling very fatigued and had lightheadedness with standing.  He presented to the emergency department because of these issues.   During workup, possible T3 compression fracture was noted.  I was consulted for management recommendations.   He denies weakness in his arms or legs.  He has no substantial change in his symptoms of neuropathy.   The symptoms are causing a significant impact on the patient's life.    I have utilized the care everywhere function in epic to review the outside Townsend available from external health systems.  Review of Systems:  A 10 point review of systems is negative,  except for the pertinent positives and negatives detailed in the HPI.  Past Medical History: Past Medical History:  Diagnosis Date   Arthritis    neck and shoulders   Chronic kidney disease    chronic kidney disease - stage 3, GFR 30-59 ml/min   Colon cancer [facility])    ED (erectile dysfunction)    History of 2019 novel coronavirus disease (COVID-19) 08/23/2020   Hyperlipidemia    Hypertension    Inguinal hernia    NSTEMI (non-ST elevated myocardial infarction) (HCC)    Pre-diabetes    Tubular adenoma of colon     Past Surgical History: Past Surgical History:  Procedure Laterality Date   COLON SURGERY Right 05/06   colectomy and sigmoid colectomy    COLONOSCOPY WITH PROPOFOL N/A 03/13/2016   Procedure: COLONOSCOPY WITH PROPOFOL;  Surgeon: Carl Deem, MD;  Location: [facility] ENDOSCOPY;  Service: Endoscopy;  Laterality: N/A;   COLONOSCOPY WITH PROPOFOL N/A 03/09/2020   Procedure: COLONOSCOPY WITH PROPOFOL;  Surgeon: Carl Townsend, Carl Nearing, MD;  Location: [facility] ENDOSCOPY;  Service: Gastroenterology;  Laterality: N/A;   CORONARY STENT INTERVENTION N/A 01/12/2021   Procedure: CORONARY STENT INTERVENTION;  Surgeon: Carl Records, MD;  Location: [facility] INVASIVE CV LAB;  Service: Cardiovascular;  Laterality: N/A;   CORONARY ULTRASOUND/IVUS N/A 01/12/2021   Procedure: Intravascular Ultrasound/IVUS;  Surgeon: Carl Records, MD;  Location: [facility] INVASIVE CV LAB;  Service: Cardiovascular;  Laterality: N/A;   EYE SURGERY Left    vitreous retinal  surgery   IABP INSERTION N/A 01/12/2021   Procedure: IABP Insertion;  Surgeon: Carl Pea, MD;  Location: [facility] INVASIVE CV LAB;  Service: Cardiovascular;  Laterality: N/A;   LEFT HEART CATH AND CORONARY ANGIOGRAPHY N/A 01/12/2021   Procedure: LEFT HEART CATH AND CORONARY ANGIOGRAPHY possible PCI and stent;  Surgeon: Carl Pea, MD;  Location: [facility] INVASIVE CV LAB;  Service: Cardiovascular;  Laterality: N/A;   right foot surgery     XI ROBOTIC  ASSISTED INGUINAL HERNIA REPAIR WITH MESH Bilateral 11/10/2020   Procedure: XI ROBOTIC ASSISTED INGUINAL HERNIA REPAIR WITH MESH;  Surgeon: Carl Amabile, DO;  Location: [facility] ORS;  Service: General;  Laterality: Bilateral;    Allergies: Allergies as of 09/19/2023   (No Known Allergies)    Medications: Outpatient Encounter Medications as of 09/19/2023  Medication Sig   alendronate (FOSAMAX) 70 MG tablet Take 70 mg by mouth once a week.   ascorbic acid (VITAMIN C) 500 MG tablet Take 500 mg by mouth daily.   aspirin 81 MG tablet Take 81 mg by mouth daily.   atorvastatin (LIPITOR) 80 MG tablet TAKE 1 TABLET BY MOUTH EVERYDAY AT BEDTIME   Carboxymethylcellulose Sodium (REFRESH LIQUIGEL) 1 % GEL Place 1 drop into both eyes at bedtime.    clopidogrel (PLAVIX) 75 MG tablet Take 75 mg by mouth daily.   cyanocobalamin 1000 MCG tablet Take 1,000 mcg by mouth daily.   ergocalciferol (VITAMIN D2) 1.25 MG (50000 UT) capsule Take 50,000 Units by mouth once a week.   Glycerin-Polysorbate 80 (REFRESH DRY EYE THERAPY OP) Place 1 drop into both eyes daily as needed (dry eye).   HYDROcodone-acetaminophen (NORCO/VICODIN) 5-325 MG tablet Take 1 tablet by mouth every 6 (six) hours as needed for severe pain.   iron polysaccharides (NIFEREX) 150 MG capsule Take 150 mg by mouth daily.   isosorbide mononitrate (IMDUR) 30 MG 24 hr tablet Take 1 tablet (30 mg total) by mouth daily.   methocarbamol (ROBAXIN) 500 MG tablet TAKE 1 TABLET (500 MG TOTAL) BY MOUTH DAILY AS NEEDED FOR MUSCLE SPASMS.   metoprolol succinate (TOPROL-XL) 25 MG 24 hr tablet Take 1 tablet (25 mg total) by mouth daily.   Multiple Vitamin (MULTIVITAMIN) tablet Take 1 tablet by mouth daily.   nitroGLYCERIN (NITROSTAT) 0.4 MG SL tablet Place 1 tablet (0.4 mg total) under the tongue every 5 (five) minutes as needed.   pantoprazole (PROTONIX) 40 MG tablet Take 40 mg by mouth daily.   sildenafil (REVATIO) 20 MG tablet Take 20 mg by mouth as needed.    zinc gluconate 50 MG tablet Take 50 mg by mouth daily.   No facility-administered encounter medications on file as of 09/19/2023.    Social History: Social History   Tobacco Use   Smoking status: Former    Current packs/day: 0.00    Types: Cigarettes    Quit date: 2000    Years since quitting: 24.7   Smokeless tobacco: Never  Vaping Use   Vaping status: Never Used  Substance Use Topics   Alcohol use: Yes    Comment: glass of wine "once in a while"    Drug use: No    Family Medical History: Family History  Problem Relation Age of Onset   Heart failure Mother    Heart attack Father    Heart attack Brother        Multiple MI's; died in his 40s   Heart disease Brother        Multiple PCI's  first in their 30s    Physical Examination:  There were no vitals filed for this visit. There is no height or weight on file to calculate BMI.  General: Patient is well developed, well nourished, calm, collected, and in no apparent distress. Attention to examination is appropriate.  NEUROLOGICAL:     CN II-XII grossly intact  ROM of spine: limited throughout  Palpation of spine: non TTP   Strength: 5/5 strength throughout    Medical Decision Making  Imaging: 09/19/23 thoracic xrays Stable appearing T3 fracture without any obvious malalignment   07/23/23 thoracic xrays T3 fracture is not well visualized   Assessment and Plan: Mr. Lyndon is a pleasant 77 y.o. male with T3 compression fracture.  His x-rays are reassuring as well as his clinical picture.  He is without any significant pain and has resumed his normal activities.  We discussed red flag symptoms in detail.  I will otherwise see him going forward on an as-needed basis.  He expressed understanding and was in agreement with this plan.  I spent a total of 20 minutes in both face-to-face and non-face-to-face activities for this visit on the date of this encounter including review of imaging, review of documentation,  discussion of symptoms, physical exam, order placement, and documentation.   [facility] Dept. of Neurosurgery

## 2023-10-28 ENCOUNTER — Ambulatory Visit: Payer: Medicare Other

## 2023-10-28 DIAGNOSIS — Z23 Encounter for immunization: Secondary | ICD-10-CM

## 2023-10-28 DIAGNOSIS — Z719 Counseling, unspecified: Secondary | ICD-10-CM

## 2023-10-28 NOTE — Progress Notes (Signed)
Patient seen in nurse clinic for COVID vaccine. Comirnaty +12Y 2024-25 IM left deltoid. Tolerated well. VIS provided. [facility] updated and copy provided. Waited 10 minutes.

## 2023-11-14 ENCOUNTER — Other Ambulatory Visit: Payer: Self-pay | Admitting: Neurosurgery

## 2024-01-08 ENCOUNTER — Encounter: Payer: Self-pay | Admitting: Podiatry

## 2024-01-08 ENCOUNTER — Ambulatory Visit (INDEPENDENT_AMBULATORY_CARE_PROVIDER_SITE_OTHER): Payer: Medicare Other | Admitting: Podiatry

## 2024-01-08 DIAGNOSIS — M7741 Metatarsalgia, right foot: Secondary | ICD-10-CM | POA: Diagnosis not present

## 2024-01-08 DIAGNOSIS — M79675 Pain in left toe(s): Secondary | ICD-10-CM | POA: Diagnosis not present

## 2024-01-08 DIAGNOSIS — L909 Atrophic disorder of skin, unspecified: Secondary | ICD-10-CM | POA: Diagnosis not present

## 2024-01-08 DIAGNOSIS — M79674 Pain in right toe(s): Secondary | ICD-10-CM | POA: Diagnosis not present

## 2024-01-08 DIAGNOSIS — L84 Corns and callosities: Secondary | ICD-10-CM

## 2024-01-08 DIAGNOSIS — B351 Tinea unguium: Secondary | ICD-10-CM

## 2024-01-08 DIAGNOSIS — M7742 Metatarsalgia, left foot: Secondary | ICD-10-CM

## 2024-01-08 NOTE — Patient Instructions (Signed)
 More silicone pads can be purchased from:  [email or web address]

## 2024-01-08 NOTE — Progress Notes (Signed)
  Subjective:  Patient ID: Carl Townsend, male    DOB: May 28, 1946,  MRN: [id number]  Chief Complaint  Patient presents with   Callouses    I have bad feet.  I have callouses and my toenails are out of hand.    78 y.o. male presents with the above complaint. History confirmed with patient.  He has a history of previous right fifth hammertoe surgery.  The calluses can be painful but the toenails are worse.  Objective:  Physical Exam: warm, good capillary refill, no trophic changes or ulcerative lesions, normal DP and PT pulses, normal sensory exam, and bilateral submetatarsal 2 3 and 5 callus worse on the right submetatarsal 5, has fat pad atrophy throughout the forefoot. Left Foot: dystrophic yellowed discolored nail plates with subungual debris Right Foot: dystrophic yellowed discolored nail plates with subungual debris   Assessment:   1. Pain due to onychomycosis of toenails of both feet   2. Callus of foot   3. Metatarsalgia of both feet   4. Fat pad atrophy of foot      Plan:  Patient was evaluated and treated and all questions answered.   Discussed the etiology and treatment options for the condition in detail with the patient.  Recommended debridement of the nails today. Sharp and mechanical debridement performed of all painful and mycotic nails today. Nails debrided in length and thickness using a nail nipper to level of comfort. Discussed treatment options including appropriate shoe gear. Follow up as needed for painful nails.  We discussed the painful areas on his plantar forefoot which mostly is bony pain due to fat pad atrophy he does have some callus formation which I debrided as a courtesy today.  He is currently using O'Keefe's foot cream to keep his foot moisturized.  We discussed that routine trimming of calluses likely would not be covered as he is not diabetic and does not have significant peripheral vascular disease.  I recommended offloading with a silicone  metatarsal pad and this was dispensed today.  Return in about 3 months (around 04/07/2024) for painful thick fungal nails.

## 2024-02-04 ENCOUNTER — Other Ambulatory Visit (INDEPENDENT_AMBULATORY_CARE_PROVIDER_SITE_OTHER): Payer: Self-pay | Admitting: Vascular Surgery

## 2024-02-04 DIAGNOSIS — I70213 Atherosclerosis of native arteries of extremities with intermittent claudication, bilateral legs: Secondary | ICD-10-CM

## 2024-02-10 ENCOUNTER — Encounter (INDEPENDENT_AMBULATORY_CARE_PROVIDER_SITE_OTHER): Payer: Medicare Other

## 2024-02-10 ENCOUNTER — Ambulatory Visit (INDEPENDENT_AMBULATORY_CARE_PROVIDER_SITE_OTHER): Payer: Medicare Other | Admitting: Vascular Surgery

## 2024-03-19 ENCOUNTER — Ambulatory Visit (INDEPENDENT_AMBULATORY_CARE_PROVIDER_SITE_OTHER): Payer: Medicare Other

## 2024-03-19 ENCOUNTER — Ambulatory Visit (INDEPENDENT_AMBULATORY_CARE_PROVIDER_SITE_OTHER): Payer: Medicare Other | Admitting: Vascular Surgery

## 2024-03-19 DIAGNOSIS — I70213 Atherosclerosis of native arteries of extremities with intermittent claudication, bilateral legs: Secondary | ICD-10-CM

## 2024-03-23 LAB — VAS US ABI WITH/WO TBI

## 2024-03-25 DIAGNOSIS — I6529 Occlusion and stenosis of unspecified carotid artery: Secondary | ICD-10-CM | POA: Insufficient documentation

## 2024-03-25 NOTE — Progress Notes (Signed)
 MRN : [id number]  Carl Townsend is a 78 y.o. (29-Jun-1946) male who presents with chief complaint of check circulation.  History of Present Illness:   The patient returns to the office for followup regarding atherosclerotic changes of the lower extremities and review of the noninvasive studies.   There have been no interval changes in lower extremity symptoms. No interval shortening of the patient's claudication distance or development of rest pain symptoms. No new ulcers or wounds have occurred since the last visit.   There have been no significant changes to the patient's overall health care.   The patient denies amaurosis fugax or recent TIA symptoms. There are no recent neurological changes noted. The patient denies history of DVT, PE or superficial thrombophlebitis. The patient denies recent episodes of angina or shortness of breath.    ABI's Rt=[place] TBI=0.0.62 and Lt=0.96 (monophasic) TBI=0.49 (previous ABI's Rt=[place] TBI=0.71 and Lt=1.06 (monophasic) TBI=0.55)  No outpatient medications have been marked as taking for the 03/26/24 encounter (Appointment) with Gilda Crease, Latina Craver, MD.    Past Medical History:  Diagnosis Date   Arthritis    neck and shoulders   Chronic kidney disease    chronic kidney disease - stage 3, GFR 30-59 ml/min   Colon cancer [facility])    ED (erectile dysfunction)    History of 2019 novel coronavirus disease (COVID-19) 08/23/2020   Hyperlipidemia    Hypertension    Inguinal hernia    NSTEMI (non-ST elevated myocardial infarction) (HCC)    Pre-diabetes    Tubular adenoma of colon     Past Surgical History:  Procedure Laterality Date   COLON SURGERY Right 05/06   colectomy and sigmoid colectomy    COLONOSCOPY WITH PROPOFOL N/A 03/13/2016   Procedure: COLONOSCOPY WITH PROPOFOL;  Surgeon: Christena Deem, MD;  Location: [facility];  Service: Endoscopy;  Laterality: N/A;    COLONOSCOPY WITH PROPOFOL N/A 03/09/2020   Procedure: COLONOSCOPY WITH PROPOFOL;  Surgeon: Toledo, Boykin Nearing, MD;  Location: [facility] ENDOSCOPY;  Service: Gastroenterology;  Laterality: N/A;   CORONARY STENT INTERVENTION N/A 01/12/2021   Procedure: CORONARY STENT INTERVENTION;  Surgeon: Lyn Records, MD;  Location: [facility] INVASIVE CV LAB;  Service: Cardiovascular;  Laterality: N/A;   CORONARY ULTRASOUND/IVUS N/A 01/12/2021   Procedure: Intravascular Ultrasound/IVUS;  Surgeon: Lyn Records, MD;  Location: [facility] INVASIVE CV LAB;  Service: Cardiovascular;  Laterality: N/A;   EYE SURGERY Left    vitreous retinal surgery   IABP INSERTION N/A 01/12/2021   Procedure: IABP Insertion;  Surgeon: Alwyn Pea, MD;  Location: [facility] INVASIVE CV LAB;  Service: Cardiovascular;  Laterality: N/A;   LEFT HEART CATH AND CORONARY ANGIOGRAPHY N/A 01/12/2021   Procedure: LEFT HEART CATH AND CORONARY ANGIOGRAPHY possible PCI and stent;  Surgeon: Alwyn Pea, MD;  Location: [facility] INVASIVE CV LAB;  Service: Cardiovascular;  Laterality: N/A;   right foot surgery     XI ROBOTIC ASSISTED INGUINAL HERNIA REPAIR WITH MESH Bilateral 11/10/2020   Procedure: XI ROBOTIC ASSISTED INGUINAL HERNIA REPAIR WITH MESH;  Surgeon: Sung Amabile, DO;  Location: [facility] ORS;  Service: General;  Laterality: Bilateral;    Social History Social History   Tobacco Use   Smoking status: Former    Current packs/day: 0.00    Types: Cigarettes    Quit date: 2000    Years since quitting: 25.2   Smokeless tobacco: Never  Vaping Use   Vaping status: Never Used  Substance Use Topics   Alcohol use: Yes    Comment: glass of wine about 3 times a week   Drug use: No    Family History Family History  Problem Relation Age of Onset   Heart failure Mother    Heart attack Father    Heart attack Brother        Multiple MI's; died in his 54s   Heart disease Brother        Multiple PCI's first in their 65s    No Known Allergies   REVIEW OF  SYSTEMS (Negative unless checked)  Constitutional: [] Weight loss  [] Fever  [] Chills Cardiac: [] Chest pain   [] Chest pressure   [] Palpitations   [] Shortness of breath when laying flat   [] Shortness of breath with exertion. Vascular:  [x] Pain in legs with walking   [] Pain in legs at rest  [] History of DVT   [] Phlebitis   [] Swelling in legs   [] Varicose veins   [] Non-healing ulcers Pulmonary:   [] Uses home oxygen   [] Productive cough   [] Hemoptysis   [] Wheeze  [] COPD   [] Asthma Neurologic:  [] Dizziness   [] Seizures   [] History of stroke   [] History of TIA  [] Aphasia   [] Vissual changes   [] Weakness or numbness in arm   [] Weakness or numbness in leg Musculoskeletal:   [] Joint swelling   [] Joint pain   [] Low back pain Hematologic:  [] Easy bruising  [] Easy bleeding   [] Hypercoagulable state   [] Anemic Gastrointestinal:  [] Diarrhea   [] Vomiting  [] Gastroesophageal reflux/heartburn   [] Difficulty swallowing. Genitourinary:  [] Chronic kidney disease   [] Difficult urination  [] Frequent urination   [] Blood in urine Skin:  [] Rashes   [] Ulcers  Psychological:  [] History of anxiety   []  History of major depression.  Physical Examination  There were no vitals filed for this visit. There is no height or weight on file to calculate BMI. Gen: WD/WN, NAD Head: [place]/AT, No temporalis wasting.  Ear/Nose/Throat: Hearing grossly intact, nares w/o erythema or drainage Eyes: PER, EOMI, sclera nonicteric.  Neck: Supple, no masses.  No bruit or JVD.  Pulmonary:  Good air movement, no audible wheezing, no use of accessory muscles.  Cardiac: RRR, normal S1, S2, no Murmurs. Vascular:  mild trophic changes, no open wounds Vessel Right Left  Radial Palpable Palpable  PT Not Palpable Not Palpable  DP Not Palpable Not Palpable  Gastrointestinal: soft, non-distended. No guarding/no peritoneal signs.  Musculoskeletal: M/S 5/5 throughout.  No visible deformity.  Neurologic: CN 2-12 intact. Pain and light touch intact in  extremities.  Symmetrical.  Speech is fluent. Motor exam as listed above. Psychiatric: Judgment intact, Mood & affect appropriate for pt's clinical situation. Dermatologic: No rashes or ulcers noted.  No changes consistent with cellulitis.   CBC Lab Results  Component Value Date   WBC 3.3 (L) 06/26/2023   HGB 12.2 (L) 06/26/2023   HCT 37.6 (L) 06/26/2023   MCV 104.7 (H) 06/26/2023   PLT 201 06/26/2023    BMET    Component Value Date/Time   NA 132 (L) 06/26/2023 0509   K 3.5 06/26/2023 0509   CL 103 06/26/2023 0509   CO2 22 06/26/2023  0509   GLUCOSE 99 06/26/2023 0509   BUN 20 06/26/2023 0509   CREATININE 1.67 (H) 06/26/2023 0509   CALCIUM 8.9 06/26/2023 0509   GFRNONAA 42 (L) 06/26/2023 0509   CrCl cannot be calculated (Patient's most recent lab result is older than the maximum 21 days allowed.).  COAG Lab Results  Component Value Date   INR 1.1 01/11/2021    Radiology VAS [place] ABI WITH/WO TBI Result Date: 03/23/2024  LOWER EXTREMITY DOPPLER STUDY Patient Name:  Carl Townsend  Date of Exam:   03/19/2024 Medical Rec #: [id number]             Accession #:    [id number] Date of Birth: 03/13/1946             Patient Gender: M Patient Age:   67 years Exam Location:  [facility] Procedure:      VAS [place] ABI WITH/WO TBI Referring Phys: [facility] --------------------------------------------------------------------------------  Indications: Peripheral artery disease. High Risk Factors: Hyperlipidemia, past history of smoking.  Performing Technologist: Hardie Lora RVT  Examination Guidelines: A complete evaluation includes at minimum, Doppler waveform signals and systolic blood pressure reading at the level of bilateral brachial, anterior tibial, and posterior tibial arteries, when vessel segments are accessible. Bilateral testing is considered an integral part of a complete examination. Photoelectric Plethysmograph (PPG) waveforms and toe systolic pressure readings  are included as required and additional duplex testing as needed. Limited examinations for reoccurring indications may be performed as noted.  ABI Findings: +---------+------------------+-----+----------+----------------+ Right    Rt Pressure (mmHg)IndexWaveform  Comment          +---------+------------------+-----+----------+----------------+ Brachial 159                                               +---------+------------------+-----+----------+----------------+ ATA      255               1.59 biphasic  Non compressible +---------+------------------+-----+----------+----------------+ PTA      255               1.59 monophasicNon compressible +---------+------------------+-----+----------+----------------+ Great Toe100               0.62                            +---------+------------------+-----+----------+----------------+ +---------+------------------+-----+----------+----------------+ Left     Lt Pressure (mmHg)IndexWaveform  Comment          +---------+------------------+-----+----------+----------------+ Brachial 160                                               +---------+------------------+-----+----------+----------------+ ATA      255               1.59 monophasicNon compressible +---------+------------------+-----+----------+----------------+ PTA      154               0.96 monophasic                 +---------+------------------+-----+----------+----------------+ Great Toe79                0.49                            +---------+------------------+-----+----------+----------------+ +-------+----------------+-----------+----------------+------------+  ABI/TBIToday's ABI     Today's TBIPrevious ABI    Previous TBI +-------+----------------+-----------+----------------+------------+ Right  Non compressible0.62       Non compressible0.87         +-------+----------------+-----------+----------------+------------+ Left   Non  compressible0.49       1.07            0.73         +-------+----------------+-----------+----------------+------------+  Arterial wall calcification precludes accurate ankle pressures and ABIs. Bilateral ABIs appear essentially unchanged compared to prior study on 02/07/2023.  Summary: Right: Resting right ankle-brachial index indicates noncompressible right lower extremity arteries. The right toe-brachial index is abnormal. Left: Resting left ankle-brachial index indicates noncompressible left lower extremity arteries. The left toe-brachial index is abnormal. *See table(s) above for measurements and observations.  Electronically signed by Levora Dredge MD on 03/23/2024 at 7:39:23 AM.    Final      Assessment/Plan 1. Atherosclerosis of native artery of both lower extremities with intermittent claudication (HCC) (Primary) Recommend:   The patient has evidence of atherosclerosis of the lower extremities with claudication.  The patient does not voice lifestyle limiting changes at this point in time.   Noninvasive studies do not suggest clinically significant change.   No invasive studies, angiography or surgery at this time The patient should continue walking and begin a more formal exercise program.  The patient should continue antiplatelet therapy and aggressive treatment of the lipid abnormalities   No changes in the patient's medications at this time - VAS [place] ABI WITH/WO TBI; Future  2. Bilateral carotid artery stenosis Recommend:  Given the patient's asymptomatic subcritical stenosis no further invasive testing or surgery at this time.  Continue antiplatelet therapy as prescribed Continue management of CAD, HTN and Hyperlipidemia Healthy heart diet,  encouraged exercise at least 4 times per week  Follow up in 24 months with duplex ultrasound and physical exam   3. CAD S/P RCA stent 12/2020 Continue cardiac and antihypertensive medications as already ordered and reviewed, no  changes at this time.  Continue statin as ordered and reviewed, no changes at this time  Nitrates PRN for chest pain  4. Essential hypertension Continue antihypertensive medications as already ordered, these medications have been reviewed and there are no changes at this time.  5. Mixed hyperlipidemia Continue statin as ordered and reviewed, no changes at this time    Levora Dredge, MD  03/25/2024 2:45 PM

## 2024-03-26 ENCOUNTER — Encounter (INDEPENDENT_AMBULATORY_CARE_PROVIDER_SITE_OTHER): Payer: Self-pay | Admitting: Vascular Surgery

## 2024-03-26 ENCOUNTER — Ambulatory Visit (INDEPENDENT_AMBULATORY_CARE_PROVIDER_SITE_OTHER): Admitting: Vascular Surgery

## 2024-03-26 VITALS — BP 173/90 | HR 91 | Resp 16 | Ht 71.0 in | Wt 125.0 lb

## 2024-03-26 DIAGNOSIS — I6523 Occlusion and stenosis of bilateral carotid arteries: Secondary | ICD-10-CM | POA: Diagnosis not present

## 2024-03-26 DIAGNOSIS — I10 Essential (primary) hypertension: Secondary | ICD-10-CM

## 2024-03-26 DIAGNOSIS — E782 Mixed hyperlipidemia: Secondary | ICD-10-CM

## 2024-03-26 DIAGNOSIS — Z9861 Coronary angioplasty status: Secondary | ICD-10-CM

## 2024-03-26 DIAGNOSIS — I70213 Atherosclerosis of native arteries of extremities with intermittent claudication, bilateral legs: Secondary | ICD-10-CM

## 2024-03-26 DIAGNOSIS — I2510 Atherosclerotic heart disease of native coronary artery without angina pectoris: Secondary | ICD-10-CM

## 2024-03-28 ENCOUNTER — Encounter (INDEPENDENT_AMBULATORY_CARE_PROVIDER_SITE_OTHER): Payer: Self-pay | Admitting: Vascular Surgery

## 2024-04-06 ENCOUNTER — Ambulatory Visit (INDEPENDENT_AMBULATORY_CARE_PROVIDER_SITE_OTHER): Payer: Medicare Other | Admitting: Podiatry

## 2024-04-06 ENCOUNTER — Encounter: Payer: Self-pay | Admitting: Podiatry

## 2024-04-06 DIAGNOSIS — M810 Age-related osteoporosis without current pathological fracture: Secondary | ICD-10-CM | POA: Insufficient documentation

## 2024-04-06 DIAGNOSIS — M79674 Pain in right toe(s): Secondary | ICD-10-CM

## 2024-04-06 DIAGNOSIS — I739 Peripheral vascular disease, unspecified: Secondary | ICD-10-CM | POA: Insufficient documentation

## 2024-04-06 DIAGNOSIS — M79675 Pain in left toe(s): Secondary | ICD-10-CM | POA: Diagnosis not present

## 2024-04-06 DIAGNOSIS — L84 Corns and callosities: Secondary | ICD-10-CM | POA: Diagnosis not present

## 2024-04-06 DIAGNOSIS — B351 Tinea unguium: Secondary | ICD-10-CM

## 2024-04-06 DIAGNOSIS — H9190 Unspecified hearing loss, unspecified ear: Secondary | ICD-10-CM | POA: Insufficient documentation

## 2024-04-06 DIAGNOSIS — H04123 Dry eye syndrome of bilateral lacrimal glands: Secondary | ICD-10-CM | POA: Insufficient documentation

## 2024-04-06 DIAGNOSIS — H2511 Age-related nuclear cataract, right eye: Secondary | ICD-10-CM | POA: Insufficient documentation

## 2024-04-06 DIAGNOSIS — H02889 Meibomian gland dysfunction of unspecified eye, unspecified eyelid: Secondary | ICD-10-CM | POA: Insufficient documentation

## 2024-04-06 DIAGNOSIS — I70213 Atherosclerosis of native arteries of extremities with intermittent claudication, bilateral legs: Secondary | ICD-10-CM | POA: Diagnosis not present

## 2024-04-08 ENCOUNTER — Other Ambulatory Visit: Payer: Self-pay | Admitting: Internal Medicine

## 2024-04-08 DIAGNOSIS — I25110 Atherosclerotic heart disease of native coronary artery with unstable angina pectoris: Secondary | ICD-10-CM

## 2024-04-11 NOTE — Progress Notes (Signed)
  Subjective:  Patient ID: Carl Townsend, male    DOB: 1946-08-01,  MRN: [id number]  Carl Townsend presents to clinic today for at risk foot care. Patient has h/o PAD and callus(es) of both feet and painful mycotic toenails that are difficult to trim. Painful toenails interfere with ambulation. Aggravating factors include wearing enclosed shoe gear. Pain is relieved with periodic professional debridement. Painful calluses are aggravated when weightbearing with and without shoegear. Pain is relieved with periodic professional debridement. He is followed by Cardiology for his PAD. Chief Complaint  Patient presents with   Nail Problem    "Clip my toenails and scrape the hard spots on the bottom of my foot."   New problem(s): None.   PCP is Antonio Baumgarten, MD. Holli Lunger 02/12/2024.  No Known Allergies  Review of Systems: Negative except as noted in the HPI.  Objective: No changes noted in today's physical examination. There were no vitals filed for this visit. Carl Townsend is a pleasant 78 y.o. male WD, WN in NAD. AAO x 3.  Vascular Examination: Capillary refill time immediate b/l. Vascular status intact b/l with palpable pedal pulses. Pedal hair absent  b/l. No pain with calf compression b/l. Skin temperature gradient WNL b/l. No cyanosis or clubbing b/l. No ischemia or gangrene noted b/l.   Neurological Examination: Sensation grossly intact b/l with 10 gram monofilament.   Dermatological Examination: Pedal skin with normal turgor, texture and tone b/l.  No open wounds. No interdigital macerations.   Toenails 1-5 b/l thick, discolored, elongated with subungual debris and pain on dorsal palpation.   Hyperkeratotic lesion(s) submet head 1 right foot, submet head 4 left foot, and submet head 5 right foot.  No erythema, no edema, no drainage, no fluctuance.  Musculoskeletal Examination: Muscle strength 5/5 to all lower extremity muscle groups bilaterally. Plantarflexed  metatarsal(s) of both feet. Plantar fat pad atrophy of forefoot area both feet.  Radiographs: None  Assessment/Plan: 1. Pain due to onychomycosis of toenails of both feet   2. Callus of foot   3. Atherosclerosis of native artery of both lower extremities with intermittent claudication [facility])     Consent given for treatment. Patient examined. All patient's and/or POA's questions/concerns addressed on today's visit.Toenails 1-5 debrided in length and girth without incident. Callus(es) submet head 1 right foot, submet head 4 left foot, and submet head 5 right foot pared with sharp debridement without incident. Continue soft, supportive shoe gear daily. Report any pedal injuries to medical professional.  -Offloaded callus(es)/porokeratotic lesion(s) of both feet with felt dancers pad applied to insole(s) of shoe. -Patient/POA to call should there be question/concern in the interim.  -Shoe recommendations given for [place]. -Patient/POA to call should there be question/concern in the interim.   Return in about 3 months (around 07/06/2024).  [name], DPM      [place] LOCATION: [address], [place] [address]                   Office [phone]   [facility] LOCATION: [address] [place], [place] [address] Office [phone]

## 2024-04-14 ENCOUNTER — Encounter
Admission: RE | Admit: 2024-04-14 | Discharge: 2024-04-14 | Disposition: A | Discharge status: Home / Self Care | Source: Ambulatory Visit | Attending: Internal Medicine | Admitting: Internal Medicine

## 2024-04-14 DIAGNOSIS — I25110 Atherosclerotic heart disease of native coronary artery with unstable angina pectoris: Secondary | ICD-10-CM | POA: Insufficient documentation

## 2024-04-16 ENCOUNTER — Encounter
Admission: RE | Admit: 2024-04-16 | Discharge: 2024-04-16 | Disposition: A | Discharge status: Home / Self Care | Source: Ambulatory Visit | Attending: Internal Medicine | Admitting: Internal Medicine

## 2024-04-16 DIAGNOSIS — I25110 Atherosclerotic heart disease of native coronary artery with unstable angina pectoris: Secondary | ICD-10-CM | POA: Insufficient documentation

## 2024-04-16 LAB — NM MYOCAR MULTI W/SPECT W/WALL MOTION / EF
Angina Index: 0
Base ST Depression (mm): 0 mm
Estimated workload: 4.6
Exercise duration (min): 3 min
Exercise duration (sec): 28 s
LV dias vol: 87 mL (ref 62–150)
LV sys vol: 31 mL
MPHR: 143 {beats}/min
Nuc Stress EF: 64 %
Peak HR: 121 {beats}/min
Percent HR: 84 %
Rest HR: 78 {beats}/min
Rest Nuclear Isotope Dose: 11 mCi
SDS: 5
SRS: 1
SSS: 3
Stress Nuclear Isotope Dose: 33 mCi
TID: 1.06

## 2024-04-16 MED ORDER — TECHNETIUM TC 99M TETROFOSMIN IV KIT
32.9600 | PACK | Freq: Once | INTRAVENOUS | Status: AC | PRN
  Administered 2024-04-16: 32.96 via INTRAVENOUS

## 2024-04-16 MED ORDER — TECHNETIUM TC 99M TETROFOSMIN IV KIT
10.9500 | PACK | Freq: Once | INTRAVENOUS | Status: AC | PRN
  Administered 2024-04-16: 10.95 via INTRAVENOUS

## 2024-05-19 ENCOUNTER — Encounter (INDEPENDENT_AMBULATORY_CARE_PROVIDER_SITE_OTHER): Payer: Self-pay

## 2024-07-13 ENCOUNTER — Ambulatory Visit (INDEPENDENT_AMBULATORY_CARE_PROVIDER_SITE_OTHER): Admitting: Podiatry

## 2024-07-13 ENCOUNTER — Encounter: Payer: Self-pay | Admitting: Podiatry

## 2024-07-13 DIAGNOSIS — M79675 Pain in left toe(s): Secondary | ICD-10-CM | POA: Diagnosis not present

## 2024-07-13 DIAGNOSIS — B351 Tinea unguium: Secondary | ICD-10-CM

## 2024-07-13 DIAGNOSIS — M79674 Pain in right toe(s): Secondary | ICD-10-CM

## 2024-07-13 DIAGNOSIS — I70213 Atherosclerosis of native arteries of extremities with intermittent claudication, bilateral legs: Secondary | ICD-10-CM | POA: Diagnosis not present

## 2024-07-13 DIAGNOSIS — L84 Corns and callosities: Secondary | ICD-10-CM | POA: Diagnosis not present

## 2024-07-19 NOTE — Progress Notes (Signed)
  Subjective:  Patient ID: Carl Townsend, male    DOB: 27-Aug-1946,  MRN: [id number]  Carl Townsend presents to clinic today for at risk foot care. Patient has h/o PAD and callus(es) b/l feet and painful mycotic toenails that are difficult to trim. Painful toenails interfere with ambulation. Aggravating factors include wearing enclosed shoe gear. Pain is relieved with periodic professional debridement. Painful calluses are aggravated when weightbearing with and without shoegear. Pain is relieved with periodic professional debridement. He is followed by Vascular Surgery for atherosclerosis of LEs Chief Complaint  Patient presents with   Nail Problem    Thick painful toenails, 3 month follow up   New problem(s): None.   PCP is Sadie Manna, MD. ARNETTA 06/18/2024.  No Known Allergies  Review of Systems: Negative except as noted in the HPI.  Objective: No changes noted in today's physical examination. There were no vitals filed for this visit. Carl Townsend is a pleasant 78 y.o. male WD, WN in NAD. AAO x 3.  Vascular Examination: Capillary refill time immediate b/l. Palpable pedal pulses. Pedal hair diminished b/l. No pain with calf compression b/l. Skin temperature gradient WNL b/l. No cyanosis or clubbing b/l. No ischemia or gangrene noted b/l.   Neurological Examination: Sensation grossly intact b/l with 10 gram monofilament.  Dermatological Examination: Pedal skin with normal turgor, texture and tone b/l.  No open wounds. No interdigital macerations.   Toenails 1-5 b/l thick, discolored, elongated with subungual debris and pain on dorsal palpation.   Hyperkeratotic lesion(s) submet head 5 left foot.  No erythema, no edema, no drainage, no fluctuance.  Musculoskeletal Examination: Muscle strength 5/5 to all lower extremity muscle groups bilaterally. Plantarflexed metatarsal(s) 5th metatarsal head left foot. Plantar fat pad atrophy of forefoot area of both  feet.  Radiographs: None Assessment/Plan: 1. Pain due to onychomycosis of toenails of both feet   2. Callus of foot   3. Atherosclerosis of native artery of both lower extremities with intermittent claudication [facility])     Patient was evaluated and treated. All patient's and/or POA's questions/concerns addressed on today's visit. Toenails 1-5 debrided in length and girth without incident. Callus(es) submet head 5 left foot pared with sharp debridement without incident. Continue daily foot inspections and monitor blood glucose per PCP/Endocrinologist's recommendations. Continue soft, supportive shoe gear daily. Report any pedal injuries to medical professional. Patient/POA to call should there be question/concern in the interim.   Return in about 3 months (around 10/13/2024).  Delon LITTIE Merlin, DPM      [place] LOCATION: [address], [place] [address]                   Office [phone]   [facility] LOCATION: [address] [place], [place] [address] Office [phone]

## 2024-10-12 ENCOUNTER — Ambulatory Visit: Admitting: Podiatry

## 2024-10-12 ENCOUNTER — Encounter: Payer: Self-pay | Admitting: Podiatry

## 2024-10-12 DIAGNOSIS — L84 Corns and callosities: Secondary | ICD-10-CM

## 2024-10-12 DIAGNOSIS — I70213 Atherosclerosis of native arteries of extremities with intermittent claudication, bilateral legs: Secondary | ICD-10-CM

## 2024-10-12 DIAGNOSIS — M79675 Pain in left toe(s): Secondary | ICD-10-CM

## 2024-10-12 DIAGNOSIS — M79674 Pain in right toe(s): Secondary | ICD-10-CM | POA: Diagnosis not present

## 2024-10-12 DIAGNOSIS — B351 Tinea unguium: Secondary | ICD-10-CM

## 2024-10-18 NOTE — Progress Notes (Signed)
  Subjective:  Patient ID: Carl Townsend, male    DOB: [birth year],  MRN: [id number]  78 y.o. male presents to clinic with  at risk foot care. Patient has h/o PAD and callus(es) b/l lower extremities and painful mycotic toenails that are difficult to trim. Painful toenails interfere with ambulation. Aggravating factors include wearing enclosed shoe gear. Pain is relieved with periodic professional debridement. Painful calluses are aggravated when weightbearing with and without shoegear. Pain is relieved with periodic professional debridement. Patient is under care of Vascular Surgery for atherosclerosis of bilateral lower extremities. Chief Complaint  Patient presents with   Toe Pain    Dr. Sadie is his PCP. His last visit was in June 2025. He denies being diabetic   New problem(s): None   PCP is Hande, Vishwanath, MD.  No Known Allergies  Review of Systems: Negative except as noted in the HPI.   Objective:  Carl Townsend is a pleasant 78 y.o. male WD, WN in NAD. AAO x 3.  Vascular Examination: Vascular status intact b/l with palpable pedal pulses. Pedal hair absent. CFT immediate b/l. No edema. No pain with calf compression b/l. Skin temperature gradient WNL b/l. No ischemia or gangrene noted b/l LE. No cyanosis or clubbing noted b/l LE.  Neurological Examination: Sensation grossly intact b/l with 10 gram monofilament.  Dermatological Examination: Pedal skin with normal turgor, texture and tone b/l. Toenails 1-5 b/l thick, discolored, elongated with subungual debris and pain on dorsal palpation. Hyperkeratotic lesion(s) submet head 5 left foot.  No erythema, no edema, no drainage, no fluctuance.  Musculoskeletal Examination: Muscle strength 5/5 to b/l LE. Plantarflexed metatarsal(s) 5th metatarsal head left foot. Plantar fat pad atrophy of forefoot area both feet.  Radiographs: None  Assessment:   1. Pain due to onychomycosis of toenails of both feet   2. Callus of  foot   3. Atherosclerosis of native artery of both lower extremities with intermittent claudication    Plan:  Consent given for treatment. Patient examined. All patient's and/or POA's questions/concerns addressed on today's visit. Mycotic toenails 1-5 b/l  debrided in length and girth without incident. Callus(es) submet head 5 left foot pared with sharp debridement without incident. Continue foot and shoe inspections daily. Monitor blood glucose per PCP/Endocrinologist's recommendations.Continue soft, supportive shoe gear daily. Report any pedal injuries to medical professional. Call office if there are any quesitons/concerns. -Patient/POA to call should there be question/concern in the interim.  Return in about 3 months (around 01/12/2025).  Delon LITTIE Merlin, DPM      [place] LOCATION: [address] [place] [address]                   Office [phone]   [facility] LOCATION: [address] [place], [place] [address] Office [phone]

## 2025-01-21 ENCOUNTER — Ambulatory Visit: Admitting: Podiatry

## 2025-01-21 ENCOUNTER — Encounter: Payer: Self-pay | Admitting: Podiatry

## 2025-01-21 DIAGNOSIS — B351 Tinea unguium: Secondary | ICD-10-CM

## 2025-01-21 DIAGNOSIS — I70213 Atherosclerosis of native arteries of extremities with intermittent claudication, bilateral legs: Secondary | ICD-10-CM

## 2025-01-21 DIAGNOSIS — L84 Corns and callosities: Secondary | ICD-10-CM

## 2025-01-21 NOTE — Progress Notes (Signed)
"  °  Subjective:  Patient ID: Carl Townsend Retort, male    DOB: 06-27-1946,  MRN: [id number]  Carl Townsend Carl Townsend presents to clinic today for at risk foot care. Patient has h/o PAD and preulcerative lesion(s) R 2nd toe, calluses of both feet and painful mycotic toenails that limit ambulation. Painful toenails interfere with ambulation. Aggravating factors include wearing enclosed shoe gear. Pain is relieved with periodic professional debridement. Painful preulcerative lesion(s) is/are aggravated when weightbearing with and without shoegear. Pain is relieved with periodic professional debridement.  Chief Complaint  Patient presents with   Nail Problem    Thick painful toenails, 3 month follow up    Diabetes    A1C  5.2   New problem(s): None.   PCP is Sadie Manna, MD.  Allergies[1]  Review of Systems: Negative except as noted in the HPI.  Objective:  There were no vitals filed for this visit. Carl Townsend Carl Townsend is a pleasant 79 y.o. male WD, WN in NAD. AAO x 3.  Vascular Examination: Vascular status intact b/l with palpable pedal pulses. Pedal hair absent. CFT immediate b/l. No edema. No pain with calf compression b/l. Skin temperature gradient WNL b/l. No ischemia or gangrene noted b/l LE. No cyanosis or clubbing noted b/l LE.  Neurological Examination: Sensation grossly intact b/l with 10 gram monofilament.  Dermatological Examination: Pedal skin with normal turgor, texture and tone b/l. Toenails 1-5 b/l thick, discolored, elongated with subungual debris and pain on dorsal palpation.   Hyperkeratotic lesion(s) submet head 5 b/l .  No erythema, no edema, no drainage, no fluctuance.  Preulcerative lesion noted distal tip of right 2nd toe.. There is visible subdermal hemorrhage. There is no surrounding erythema, no edema, no drainage, no odor, no fluctuance.  Musculoskeletal Examination: Muscle strength 5/5 to b/l LE. Plantarflexed metatarsal(s) 5th metatarsal head left  foot. Plantar fat pad atrophy of forefoot area both feet.  Radiographs: None  Assessment/Plan: 1. Pre-ulcerative corn or callous   2. Atherosclerosis of native artery of both lower extremities with intermittent claudication   Patient was evaluated and treated. All patient's and/or POA's questions/concerns addressed on today's visit. Toenails 1-5 b/l debrided in length and girth without incident. Preulcerative lesion(s) distal tip of right 2nd toe and submet head 5 b/l pared with sharp debridement without incident. Continue soft, supportive shoe gear daily. Report any pedal injuries to medical professional. Call office if there are any questions/concerns. -Patient/POA to call should there be question/concern in the interim.   Return in about 3 months (around 04/21/2025).  Delon LITTIE Merlin, DPM      [facility] LOCATION: [address] [place] [address]                   Office [phone]   [facility] LOCATION: [address] [place], [place] [address] Office [phone]      [1] No Known Allergies  "

## 2025-03-25 ENCOUNTER — Encounter (INDEPENDENT_AMBULATORY_CARE_PROVIDER_SITE_OTHER)

## 2025-03-25 ENCOUNTER — Ambulatory Visit (INDEPENDENT_AMBULATORY_CARE_PROVIDER_SITE_OTHER): Admitting: Vascular Surgery

## 2025-04-22 ENCOUNTER — Ambulatory Visit: Admitting: Podiatry
